# Patient Record
Sex: Male | Born: 1970 | Race: White | Hispanic: No | Marital: Married | Smoking: Former smoker
Health system: Southern US, Community
[De-identification: ages and names within clinical notes are randomized; demographics above are authoritative.]

## PROBLEM LIST (undated history)

## (undated) DIAGNOSIS — K319 Disease of stomach and duodenum, unspecified: Secondary | ICD-10-CM

## (undated) DIAGNOSIS — I712 Thoracic aortic aneurysm, without rupture, unspecified: Secondary | ICD-10-CM

## (undated) DIAGNOSIS — Z720 Tobacco use: Secondary | ICD-10-CM

## (undated) DIAGNOSIS — Q8740 Marfan's syndrome, unspecified: Secondary | ICD-10-CM

## (undated) HISTORY — DX: Thoracic aortic aneurysm, without rupture, unspecified: I71.20

## (undated) HISTORY — DX: Marfan syndrome, unspecified: Q87.40

## (undated) HISTORY — DX: Disease of stomach and duodenum, unspecified: K31.9

## (undated) HISTORY — DX: Tobacco use: Z72.0

## (undated) HISTORY — PX: AORTIC VALVE REPLACEMENT: SHX41

## (undated) HISTORY — DX: Thoracic aortic aneurysm, without rupture: I71.2

## (undated) SURGERY — Surgical Case
Anesthesia: *Unknown

---

## 2002-09-16 HISTORY — PX: VASECTOMY: SHX75

## 2005-10-04 ENCOUNTER — Ambulatory Visit (HOSPITAL_COMMUNITY): Admission: RE | Admit: 2005-10-04 | Discharge: 2005-10-04 | Payer: Self-pay | Admitting: Cardiovascular Disease

## 2005-11-29 ENCOUNTER — Ambulatory Visit: Payer: Self-pay | Admitting: Dentistry

## 2005-11-29 ENCOUNTER — Encounter (HOSPITAL_COMMUNITY): Admission: RE | Admit: 2005-11-29 | Discharge: 2005-11-29 | Payer: Self-pay | Admitting: Dentistry

## 2005-12-05 ENCOUNTER — Ambulatory Visit (HOSPITAL_COMMUNITY): Admission: RE | Admit: 2005-12-05 | Discharge: 2005-12-05 | Payer: Self-pay | Admitting: Dentistry

## 2005-12-17 ENCOUNTER — Ambulatory Visit: Payer: Self-pay | Admitting: Dentistry

## 2005-12-26 ENCOUNTER — Ambulatory Visit (HOSPITAL_COMMUNITY): Admission: RE | Admit: 2005-12-26 | Discharge: 2005-12-26 | Payer: Self-pay | Admitting: Gastroenterology

## 2005-12-26 ENCOUNTER — Encounter (INDEPENDENT_AMBULATORY_CARE_PROVIDER_SITE_OTHER): Payer: Self-pay | Admitting: Specialist

## 2006-01-13 ENCOUNTER — Inpatient Hospital Stay (HOSPITAL_COMMUNITY): Admission: RE | Admit: 2006-01-13 | Discharge: 2006-01-16 | Payer: Self-pay | Admitting: Cardiothoracic Surgery

## 2006-01-13 ENCOUNTER — Encounter (INDEPENDENT_AMBULATORY_CARE_PROVIDER_SITE_OTHER): Payer: Self-pay | Admitting: *Deleted

## 2006-02-06 ENCOUNTER — Encounter (HOSPITAL_COMMUNITY): Admission: RE | Admit: 2006-02-06 | Discharge: 2006-05-07 | Payer: Self-pay | Admitting: Cardiovascular Disease

## 2006-02-06 ENCOUNTER — Encounter: Admission: RE | Admit: 2006-02-06 | Discharge: 2006-02-06 | Payer: Self-pay | Admitting: Cardiothoracic Surgery

## 2006-05-08 ENCOUNTER — Encounter (HOSPITAL_COMMUNITY): Admission: RE | Admit: 2006-05-08 | Discharge: 2006-08-06 | Payer: Self-pay | Admitting: Cardiovascular Disease

## 2007-08-29 IMAGING — CT CT ANGIO CHEST
4 of 6 series · 19 of 46 positions shown · IV contrast (APPLIED)
Comparison: None.
COMPARISON: None.

CLINICAL DATA: Marfan's syndrome diagnosed in 4445. Mid to upper left chest
pain. Left pneumothorax in 4445. History of hypertension.

CT ANGIOGRAPHY OF CHEST
TECHNIQUE: Multidetector CT imaging of the chest was performed during bolus
injection of intravenous contrast.  Multiplanar CT angiographic image
reconstructions were generated to evaluate the vascular anatomy.
Contrast:   100 cc Omnipaque 300
TECHNIQUE: Multidetector CT imaging of the abdomen was performed before and
during bolus injection of intravenous contrast.  Multiplanar CT angiographic
image reconstructions were generated to evaluate the vascular anatomy.
Contrast:  100 cc Omnipaque 300

[Series 5: dissection 2.0 st · axial · 0.73mm/px · z∈[-484,-44]mm · 11 of 265 slices shown]
[im 23/265  lung]
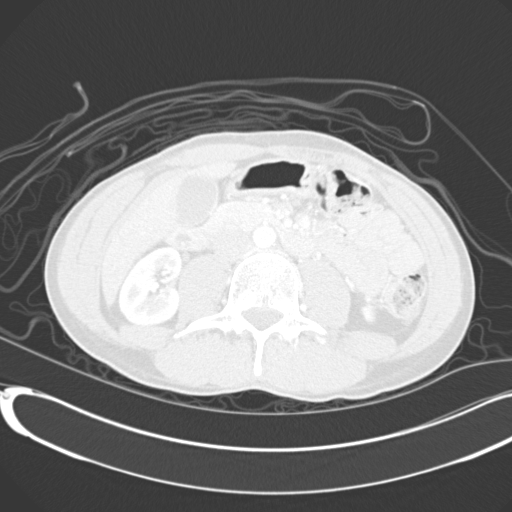
[im 45/265  soft-tissue]
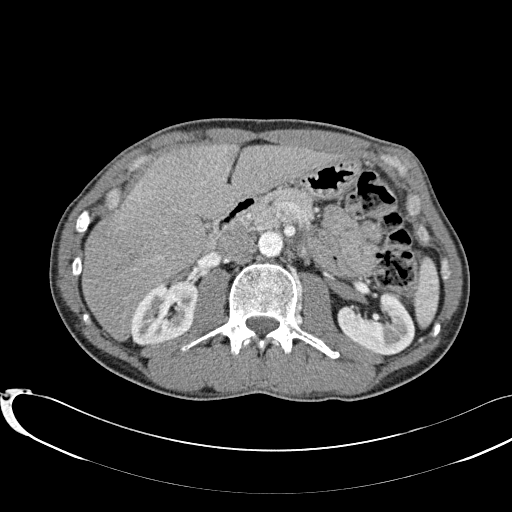
[im 67/265  lung]
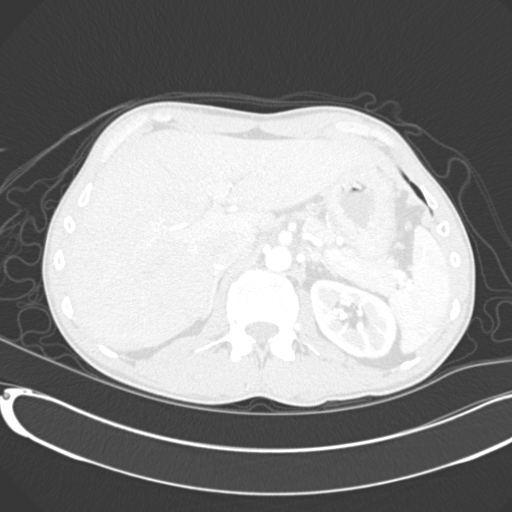
[im 89/265  soft-tissue]
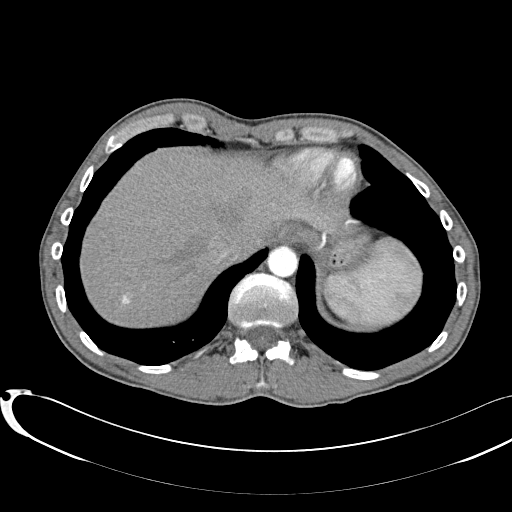
[im 111/265  lung]
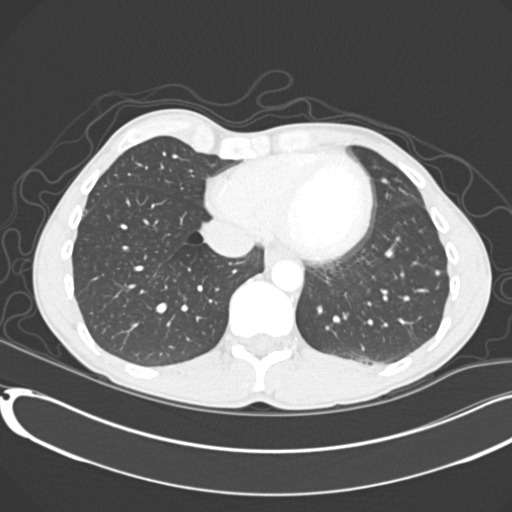
[im 133/265  soft-tissue]
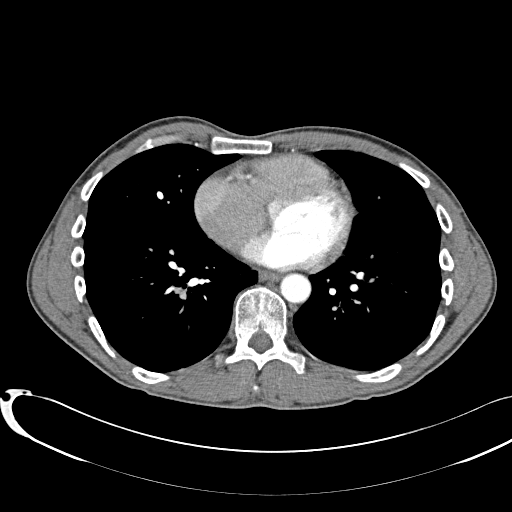
[im 155/265  lung]
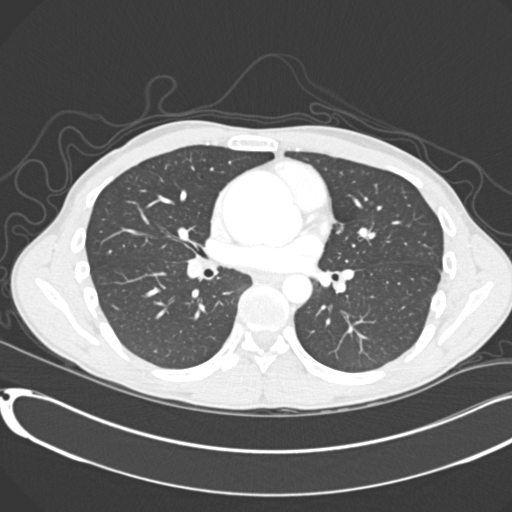
[im 177/265  soft-tissue]
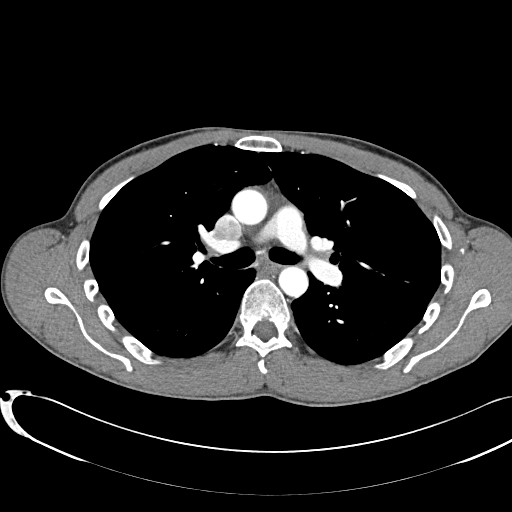
[im 199/265  lung]
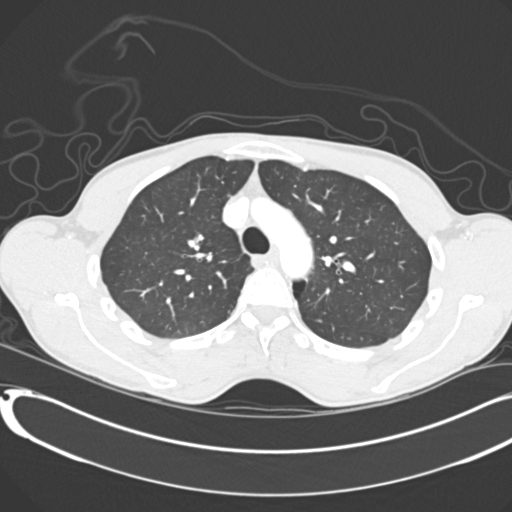
[im 221/265  soft-tissue]
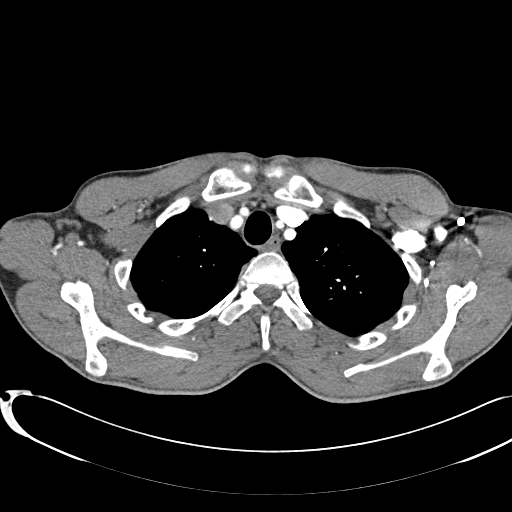
[im 243/265  lung]
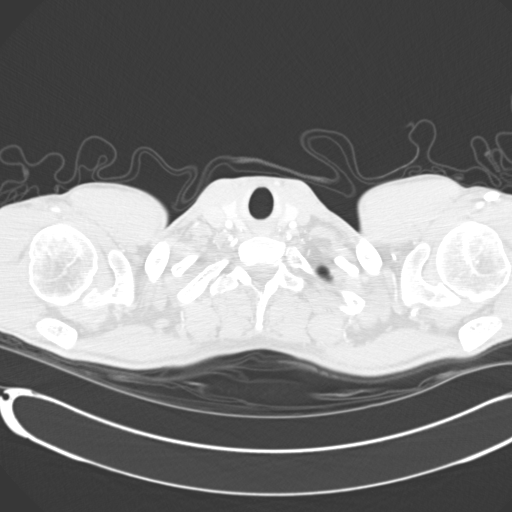

[Series 6: dissection 2.0 lung · axial · 0.73mm/px · z∈[-350,-306]mm · 2 of 197 slices shown]
[im 22/197  soft-tissue]
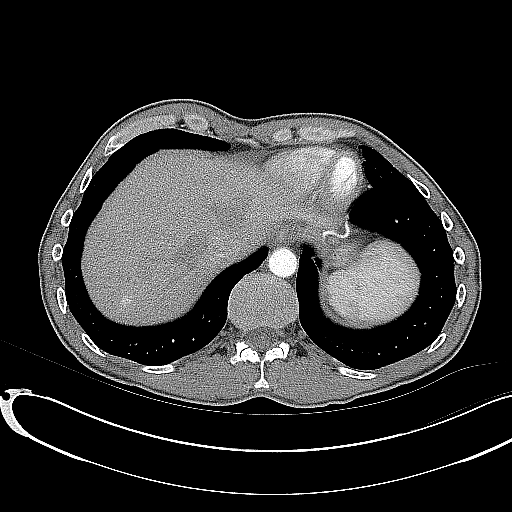
[im 44/197  soft-tissue]
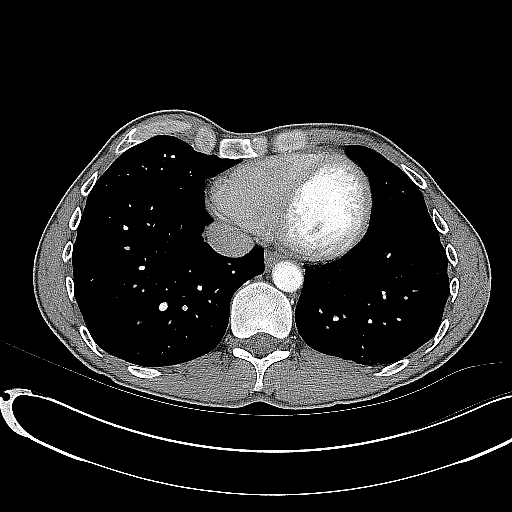

[Series 8: delays 2.0 st · axial · 0.73mm/px · z∈[-479,-383]mm · 3 of 97 slices shown]
[im 25/97  lung]
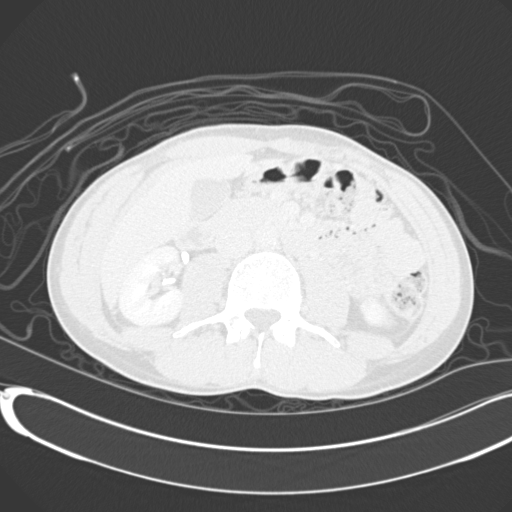
[im 49/97  lung]
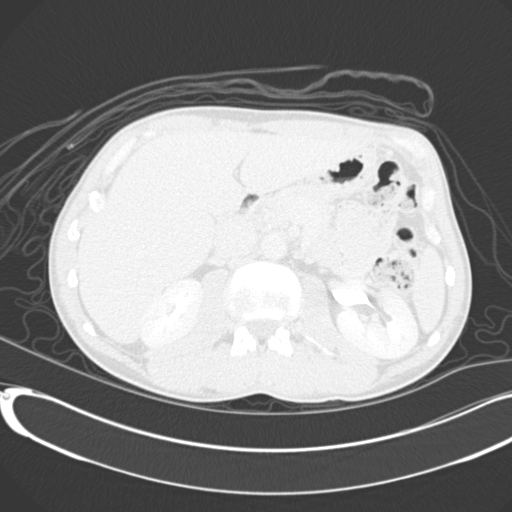
[im 73/97  lung]
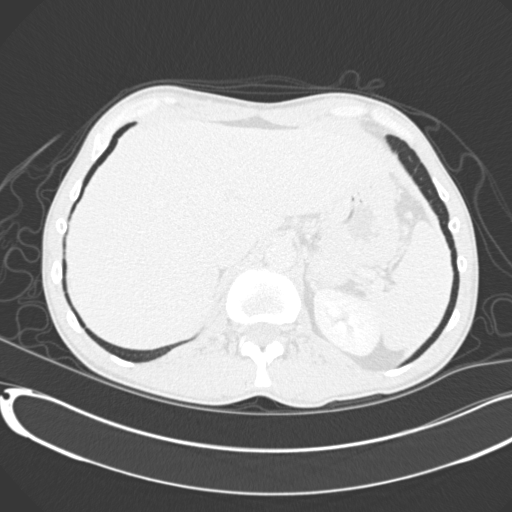

[Series 603: coronal chest · coronal · 0.77mm/px · 3 of 255 slices shown]
[im 85/255  soft-tissue]
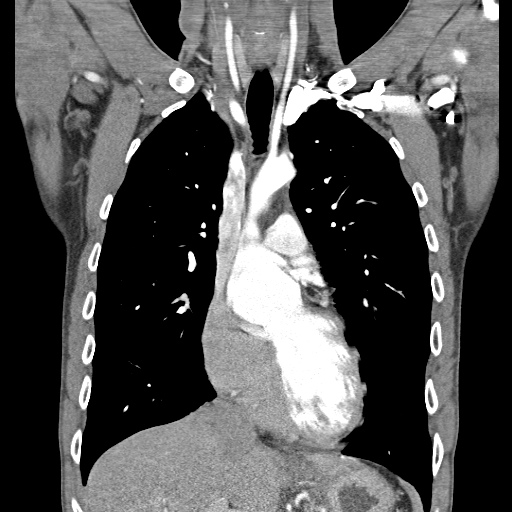
[im 113/255  soft-tissue]
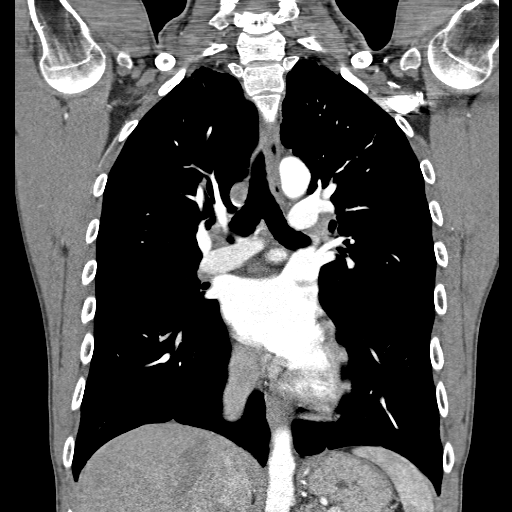
[im 142/255  soft-tissue]
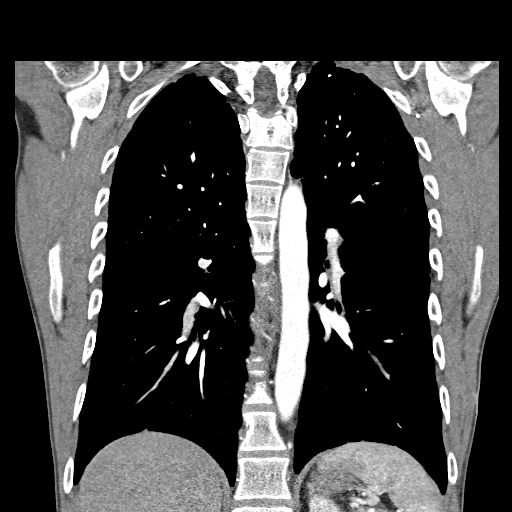

[19 of 46 positions shown; findings below may reference images not displayed]

FINDINGS: The aortic root is dilated, measuring 5.8 x 5.5 cm in maximum
dimensions. The remainder of the thoracic aorta is normal in caliber. No
dissection seen. Bullous changes are noted in both lungs. These are most
pronounced at the left lung apex and medial aspect of the left upper lobe. No
masses or enlarged lymph nodes are seen. Unremarkable bones.

IMPRESSION

1. Dilated aortic root, measuring 5.8 cm in maximum diameter.
2. Bullous changes in both lungs, most pronounced in the left upper lobe.

CT ANGIOGRAPHY OF ABDOMEN
FINDINGS: The abdominal aorta has a normal appearance with the exception of
minimal atheromatous calcification. There is also minimal atheromatous
calcification involving the left common iliac artery. No iliac artery aneurysms
are seen. Unremarkable liver, spleen, pancreas, gallbladder, kidneys and adrenal
glands. Unremarkable bones.

IMPRESSION

Minimal atheromatous changes involving the abdominal aorta and left common iliac
artery. No acute abnormality.

## 2008-07-12 ENCOUNTER — Emergency Department (HOSPITAL_COMMUNITY): Admission: EM | Admit: 2008-07-12 | Discharge: 2008-07-12 | Payer: Self-pay | Admitting: Emergency Medicine

## 2008-11-09 ENCOUNTER — Encounter: Admission: RE | Admit: 2008-11-09 | Discharge: 2008-11-09 | Payer: Self-pay | Admitting: Internal Medicine

## 2010-10-07 ENCOUNTER — Encounter: Payer: Self-pay | Admitting: Internal Medicine

## 2010-10-07 ENCOUNTER — Encounter: Payer: Self-pay | Admitting: Cardiothoracic Surgery

## 2011-02-01 NOTE — Op Note (Signed)
NAMEKEION, Ball NO.:  [id number]   MEDICAL RECORD NO.:  [id number]          PATIENT TYPE:  INP   LOCATION:  2308                         FACILITY:  [facility]   PHYSICIAN:  Guadalupe Maple, M.D.  DATE OF BIRTH:  1971-04-26   DATE OF PROCEDURE:  01/13/2006  DATE OF DISCHARGE:                                 OPERATIVE REPORT   PROCEDURE:  Intraoperative transesophageal echocardiography.   Mr. Christopher Ball is a 40 year old white male with a history of Marfan's  syndrome, who is scheduled for resection of an ascending aortic aneurysm.  The patient had been followed in [place] with a known ascending aortic  aneurysm, and it was determined that the aneurysm required surgery at this  time.  Intraoperative transesophageal echocardiography was requested to  evaluate the aneurysm and to assess the aortic valve and to determine if any  other variant of pathology was present and to assess the left ventricular  function.   Patient was brought to the operating room at [facility], and  general anesthesia was induced without difficulty.  The trachea was  intubated without difficulty.  The transesophageal echocardiography probe  was then inserted into the esophagus easily.   IMPRESSION/PRE-BYPASS FINDINGS:  1.  Ascending aorta:  It was an ascending aortic aneurysm which measured 5.9      to 6.1 cm in diameter and measured approximately 5.3 cm in length.  It      began at the aortic valve and extended to the area where the right      pulmonary artery passed posterior to the aorta.  There was no defined      sinuses of Valsalva or sinotubular ridge.  There was no evidence of      dissection of the aorta.  2.  Aortic valve:  The aortic valve appeared trileaflet.  The annulus      appeared dilated, and there was incomplete coaptation along the left, a      noncoronary cusp, and right, a noncoronary cusp.  3.  Separate jets of aortic insufficiency but the aortic  insufficiency was      graded as 2+ overall.  There was no calcification of the leaflets of the      aortic valve.  The valve appeared to open normally.  The aortic annulus      measured 2.48 cm in diameter.  4.  Mitral valve:  The mitral valve examination reveals some redundancy of      the anterior leaflet without frank prolapse and no mitral insufficiency      noted.  5.  Left ventricle:  The left ventricular function appeared vigorous, and      the left ventricular ejection fraction was estimated at 65-70%.  Left      ventricular wall thickness measured 0.95 cm at the mid papillary level      of the anterior and posterior walls.  Left ventricular end-diastolic      diameter measured 5.1 cm.  There was good contractility in all segments      interrogated.  6.  Right ventricular function:  The right ventricular function appeared      normal.  There was good contractility of the right ventricular free      wall, and right ventricular size appeared normal.  7.  Interatrial septum:  The interatrial septum was intact without evidence      of patent foramen ovale by color flow Doppler interrogation.  8.  Left atrium:  The left atrial size appeared to be within normal limits.      There was no thrombus noted in the left atrium and left atrial      appendage.  9.  Descending aorta:  The descending aorta showed no evidence of dissection      and measured 2.6 cm in diameter.  There was no significant atheromatous      disease noted in the descending aorta or ascending aorta.   POST BYPASS FINDINGS:  1.  Aortic valve:  There was a bioprosthetic aortic valve noted with no      evidence of aortic insufficiency.  2.  Ascending aorta:  The ascending aorta appeared to be replaced by a      prosthetic graft.  3.  Mitral valve:  The mitral valve function, again, showed redundancy of      the anterior leaflet and no mitral insufficiency appreciated.  4.  Left ventricle:  The left ventricular  function, again, appeared      vigorous.  Ejection fraction estimated at 65% with no wall motion      abnormalities.  5.  Right ventricular function:  There appeared to be normal right      ventricular function, as in the pre-bypass study.           ______________________________  Guadalupe Maple, M.D.     DCJ/MEDQ  D:  01/13/2006  T:  01/13/2006  Job:  [id number]

## 2011-02-01 NOTE — Op Note (Signed)
NAMEDARLE, NEWBERG NO.:  [id number]   MEDICAL RECORD NO.:  [id number]          PATIENT TYPE:  AMB   LOCATION:  DAY                          FACILITY:  [facility]   PHYSICIAN:  Charlynne Pander, D.D.S.DATE OF BIRTH:  1971-01-24   DATE OF PROCEDURE:  12/05/2005  DATE OF DISCHARGE:                                 OPERATIVE REPORT   PREOPERATIVE DIAGNOSES:  1.  Dilated aortic root.  2.  Preheart surgery dental protocol.  3.  Chronic apical periodontitis.  4.  Chronic periodontitis.  5.  Accretions.  6.  Rampant dental caries.   POSTOPERATIVE DIAGNOSES:  1.  Dilated aortic root.  2.  Preheart surgery dental protocol.  3.  Chronic apical periodontitis.  4.  Chronic periodontitis.  5.  Accretions.  6.  Rampant dental caries.   OPERATIONS:  1.  Surgical extraction of tooth numbers 1, 14, 15, 17, 18, 31 and 32.  2  Four quadrants of selective alveoloplasty.  1.  Scaling and root planing of the remaining dentition.   SURGEON:  Charlynne Pander, D.D.S.   DENTAL ASSISTANT:  Elliot Dally.   ANESTHESIA:  General anesthesia via oral endotracheal tube.   MEDICATIONS:  1.  Cephazolin 1 gram IV, prior to invasive dental procedures.  2.  Local anesthesia with total utilization of four carpules each,      containing 36 mg of Xylocaine with 0.018 mg of epinephrine, as well as      two carpules each containing 9 mg of bupivacaine with 0.009 mg of      epinephrine.   SPECIMENS:  There were 7 teeth which were discarded.   CULTURES:  None.   DRAINS:  None.   COMPLICATIONS:  None.   ESTIMATED BLOOD LOSS:  Less than 50 mL.   FLUIDS:  1500 mL of lactated Ringers solution.   INDICATIONS:  The patient was recently diagnosed with a dilated aortic root.  The patient with anticipated aortic root replacement and a possible aortic  valve replacement as well with Dr. [name]. A dental consultation was  requested to rule out dental infection which would affect the  patient's  systemic health for anticipated heart surgery. The patient was then examined  and treatment planned for multiple extractions with alveoloplasty and  scaling and root planing of the remaining dentition. This treatment plan was  formulated to decrease his risk of complications associated with dental  infection from affecting the patient's systemic health and anticipated heart  surgery.   OPERATIVE FINDINGS:  The patient was examined in operating room #12. The  teeth were identified for extraction. The patient was noted to be affected  by chronic apical periodontitis, chronic periodontitis, accretions, rampant  dental caries, and multiple retained root segments. The aforementioned  necessitated removal of tooth numbers 1, 14, 15, 17, 18, 31 and 32 with  alveoloplasty along with scaling and root planing of the remaining  dentition.   DESCRIPTION OF PROCEDURE:  The patient was brought to main operating room  #12. The patient was then placed in the supine position on operating room  table. General anesthesia  was attempted with a nasoendotracheal tube, but  then this was achieved with an oral endotracheal tube. The throat pack was  placed at this time. The patient was then prepped and draped in the usual  manner for a dental medicine procedure. The oral cavity was thoroughly  examined with the findings as noted above. The patient was then ready for  the dental medicine procedure as follows:   Local anesthesia was administered sequentially over the procedure with a  total utilization of four carpules each containing 36 mg of Xylocaine with  0.018 mg of epinephrine as well as two carpules each containing 9 mg of  bupivacaine with 0.009 mg of epinephrine.   The maxillary left and mandibular left quadrants were first approached.  Anesthesia was delivered via infiltration. An inferior alveolar nerve block  was also achieved to the lower left with the bupivacaine with epinephrine. A   15-blade incision was made from the distal of the maxillary left tuberosity  and extended to the mesial of #13. A surgical flap was then carefully  reflected.  A surgical handpiece and bur and copious amounts of sterile  saline were utilized to remove buccal and interseptal bone around the roots  of #14 and around tooth #15 as indicated. The surgical handpiece and bur  were then further utilized to section tooth numbers 14 and 15 appropriately.  These remaining roots were then subluxated and then removed with the  rongeurs appropriately. Alveoplasty was then performed utilizing rongeurs  and bone file. The surgical site was then irrigated with copious amounts  sterile saline. The tissues were approximated and trimmed appropriately. The  surgical site was then closed utilizing 3-0 chromic gut suture in a  continuous interrupted suture technique from the distal tuberosity and  extended to the distal of #13 appropriately. One interproximal suture was  placed between tooth numbers 12 and 13 at this time.   The mandibular left quadrant was then approached, a 15-blade incision was  made from the distal of #17 and extended to the mesial of #19. A surgical  flap was then carefully reflected. The buccal and the interseptal bone was  removed around tooth numbers 17 and 18 at this time. The roots of #17 were  then removed with a 23 forceps without complications. A 23 forceps was then  utilized to remove the coronal aspect tooth #18. The roots were then  sectioned with a surgical handpiece and bur and copious amounts of sterile  saline. These roots were then elevated out with a set of Cryers elevators.  Alveoplasty was then performed utilizing rongeurs and bone file. The  surgical site was irrigated with copious amounts of sterile saline. The  tissues were approximated and trimmed appropriately. The surgical site was then closed from distal of #17 and extended to the distal of #19 utilizing 3-  0  chromic gut suture in a continuous interrupted suture technique x1. The  remaining maxillary left and mandibular left teeth were then approached. A  Kavo Sonic Scaler was then utilized to remove the significant accretions  appropriately. A series of hand curettes were then utilized to perform  scaling and root planing of these remaining teeth. The Automatic Data was  then, again, utilized to fine tune the removal of accretions.   The anesthesia team then moved the oral endotracheal tube from the patient's  right side of the mouth to the left side of the mouth appropriately. The  maxillary right and mandibular right quadrants were then anesthetized  appropriately utilizing infiltration and inferior alveolar nerve blocks  appropriately. Tooth #1 was then approached, a 15-blade incision was made  from the distal of the tuberosity and extended to the mesial of #2. A  surgical flap was then carefully reflected, buccal and interseptal bone was  removed around tooth #1 appropriately. This tooth was then subluxated, and  removed with a 150 forceps without complications. Alveoloplasty was then  performed utilizing rongeurs and bone file. The surgical site was then  irrigated with copious amounts sterile saline. The tissues were approximated  and trimmed appropriately. The surgical site was then closed from the distal  of the maxillary right tuberosity, to the distal of #2.   The mandibular right quadrant was then approached, a 15-blade incision was  made from distal of #32 and extended to the mesial of #30. Surgical flap was  then carefully reflected. Appropriate amounts of buccal and interseptal bone  was removed around the remaining roots. These roots were then elevated out  with a set of Cryers elevators appropriately. There were no complications  with this. Alveoplasty was then performed utilizing rongeurs and bone file.  The surgical site was then irrigated with copious amounts of sterile  saline.  These tissues were approximated and trimmed appropriately. The surgical site  was then closed from distal of #32 and extended to the distal of #30  utilizing 3-0 chromic gut suture in a continuous interrupted suture  technique x1.   The remaining mandibular right and maxillary right teeth were then  approached. A Kavo Sonic Scaler was utilized to remove the significant  accretions. Scaling and root planing was then performed appropriately with a  set of curettes. The Automatic Data was then again utilized to remove  further accretions.   At this point time, the entire mouth was irrigated with copious amounts of  sterile saline. The patient was examined for complications, seeing none, the  dental medicine procedure was deemed to be complete. The throat pack was  removed at this time. The patient was then handed over to the anesthesia team for final disposition. After an appropriate amount of time the patient  was extubated and taken to the post anesthesia care unit with stable vital  signs and a good oxygenation level. All counts were correct for the dental  medicine procedure. The patient will be given appropriate pain medication  and will be evaluated in approximately 1 week for suture removal. The  patient will be given clearance for his anticipated heart surgery if no  further complications are noted.      Charlynne Pander, D.D.S.  Electronically Signed     RFK/MEDQ  D:  12/05/2005  T:  12/06/2005  Job:  [id number]   cc:   Sheliah Plane, MD  [address]  [place]  [place] [address]   Nanetta Batty, M.D.  Fax: [phone]

## 2011-02-01 NOTE — Op Note (Signed)
NAMEMATTHEN, GOVONI NO.:  [id number]   MEDICAL RECORD NO.:  [id number]          PATIENT TYPE:  INP   LOCATION:  2308                         FACILITY:  [facility]   PHYSICIAN:  Sheliah Plane, MD    DATE OF BIRTH:  09/16/1971   DATE OF PROCEDURE:  01/13/2006  DATE OF DISCHARGE:                                 OPERATIVE REPORT   PREOPERATIVE DIAGNOSES:  Dilated aortic root with aortic insufficiency and  probable Marfan's.   POSTOPERATIVE DIAGNOSES:  Dilated aortic root with aortic insufficiency and  probableMarfan's.   PROCEDURE:  Aortic root replacement with a Medtronic free style tissue  aortic root, 27 mm, serial #[id number] and replacement of ascending aorta  with 24 mm Hemashield graft with reimplantation of coronary arteries.   SURGEON:  Sheliah Plane, MD.   FIRST ASSISTANT:  Kerin Perna, MD.   BRIEF HISTORY:  The patient is a 40 year old male who presents with a  dilated aortic root and approximately 2+ aortic insufficiency. Its root  diameter was approximately 6 cm. In addition, the patient had a positive  family history of his father dying of an aortic dissection in his 63s.  Replacement of the patient's dilated aortic root was discussed in detail  with him. The patient had a desire not to have to take Coumadin as it would  interfere with his work. The risk and options were discussed with the  patient and he was agreeable with proceeding.   DESCRIPTION OF PROCEDURE:  With Swan-Ganz and arterial line monitors in  line, the patient underwent general endotracheal anesthesia without  incident. The skin of the chest and legs was prepped with Betadine and  draped in the usual sterile manner. A median sternotomy was performed,  pericardium was opened. The patient had a very largely dilated aortic root  probably larger than estimated by echocardiogram. Transesophageal echo probe  was placed intraoperatively and confirmed 2+ aortic insufficiency at  tricuspid aortic valve with very thinned out leaflets and then very quickly  tapering of the aorta at approximately the level of the pulmonary artery to  small aorta. This allowed plenty of room for aortic cannulation in the  chest. The patient was systemically heparinized, the ascending aorta was  cannulated, the right atrium was cannulated, retrograde cardioplegia  catheter was placed. The patient was placed on cardiopulmonary bypass at 2.4  liters per minute per m2. The patient's body temperature cooled to 30  degrees. A right superior pulmonary vein vent was placed. The patient's body  temperature cooled to 30 degrees, aortic cross clamp was applied. 700 mL of  retrograde cold blooded cardioplegia was administered in addition to topical  cold. With the heart rested, the aorta was opened through the dilated  portion, the aortic root was very thinned abnormal appearing tissue. The  aortic root was excised taking care to mobilize buttons of aorta involving  the right coronary artery and the left coronary artery. The buttons were  mobilized, the valve leaflets were excised. The annulus itself was then  sized for a 27 mm Medtronic full aortic root replacement. Over a small Felt  strip 4-0 Prolene sutures were placed through the aortic annulus in a  circumferential manner. This first layer was used as the proximal suture  line, the root replacement was rotated so the prosthetic device right  coronary site was rotated to accommodate the left coronary artery as this  anatomically met the relationships of the patient's own coronary arteries  better. The proximal suture line was completed and the prosthesis seated  well. Glue was placed along the suture line. The site of the prosthesis  right coronary ostium was opened and using a running 5-0 Prolene suture, the  left coronary button was attached to the root. In a similar fashion, the  right coronary button was attached to the aortic root with a  running 5-0  Prolene suture. To prevent any tension on suture lines, a portion of 24 mm  Hemashield graft was sewn with a running 3-0 Prolene to the distal end of  the free style prosthesis and then this was trimmed appropriately and sewed  to the aorta. Prior to the final suture line being completed, the heart was  allowed to pass, __________ and deair. Throughout the procedure intermittent  retrograde cardioplegia was administered as was antegrade cardioplegia  directly into the coronaries. The aortic crossclamp was removed with a total  crossclamp time of 56 minutes. A 16 gauge needle was introduced in the left  ventricular apex to further deair the heart. The suture lines were all  hemostatic. The patient spontaneously converted to a sinus rhythm. The  superior pulmonary vein vent was removed. The patient was then ventilated  and weaned from cardiopulmonary bypass without difficulty. He remained  hemodynamically stable. He was decannulated in the usual fashion. Protamine  sulfate was administered. With the operative field hemostatic, two atrial  and two ventricular pacing wires were applied. The patient's pericardium was  loosely reapproximated. A single Blake drain was left in the mediastinum.  The sternum was closed with a #16 steel wire, fascia closed with interrupted  #0 Vicryl, running 3-0 Vicryl in the subcutaneous tissue and 4-0  subcuticular stitch in the skin. Dry dressings were applied. Sponge and  needle count was reported as correct at the completion of the procedure. The  patient tolerated the procedure without obvious complication and was  transferred to the Surgical Intensive Care Unit for further postoperative  care.      Sheliah Plane, MD  Electronically Signed     EG/MEDQ  D:  01/15/2006  T:  01/15/2006  Job:  [id number]   cc:   Nanetta Batty, M.D.  Fax: [phone]

## 2011-02-01 NOTE — Discharge Summary (Signed)
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   DISCHARGE/SECONDARY DIAGNOSES:  1.  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  2.  Postoperative hyperglycemia, improved.  3.  Postoperative thrombocytopenia, improving.  4.  History of Helicobacter pylori associated with stomach pains and gas      without history of gastrointestinal bleed.  5.  History of left video-assisted thoracoscopic surgery for spontaneous      pneumothorax in 1997.  6.  Current tobacco use with plans to quit, status post smoking cessation      consult this hospitalization.  7.  Intolerance to Augmentin which causes vomiting.   PROCEDURES:  1.  January 13, 2006, aortic root replacement with a Medtronic free-style      aortic valve, 27 mm, serial number [id number] and replacement of      ascending aorta with 24 mm Hemashield graft with reimplantation of      coronary artery by Christopher Ball.  2.  January 13, 2006, intraoperative transesophageal echocardiogram by Dr.      Guadalupe Ball.  See dictated report for detail.   BRIEF HISTORY:  Christopher Ball is a 40 year old Caucasian male who has been  followed for the past four or five years in [place] with serial  echocardiograms for a dilated aorta.  The letters from his doctor in [place] indicate that his aortic root had been in the range of 4.8 cm.  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  He recently moved to [place], [place]  and saw Christopher Ball and was found by echocardiogram to have a  dilation of his aortic root with mild aortic insufficiency with aortic root  dilated by CT scan measuring 5.8 x 5.5 cm.  The patient had had no history  of myocardial infarction and had no previous catheterization.  He has smoked  a pack a day for the last 10 years.  His father had a history of Marfan's  and died during surgery at the age of 1 from a ruptured aortic aneurysm.  He was referred to Christopher Ball for consideration of replacement of  his aortic root and ascending aorta.  He was seen in the CVTS office on  January 07, 2006 in consultation.  By the time, he had undergone cardiac  catheterization which showed normal clear coronaries.  He had also been seen  in dental consultation and was in the process of having dental work and  extractions carried out.  Christopher Ball did feel he would benefit from surgery and discussed treatment  options.  The patient did not wish to take Coumadin even knowing that if a  replacement was performed the mechanical valve would probably last longer.  Therefore, tissue consult was planned.   HOSPITAL COURSE:  Christopher Ball was electively admitted to [facility]  on January 13, 2006 and underwent a right root and ascending aorta replacement  as previously discussed.  His postoperative course was rather uneventful,  although he did require sliding scale insulin initially for postoperative  hyperglycemia with sugars around 200 that had improved by discharge and were  around 110.  He did have a preoperative hemoglobin A1c which was normal at  5.4.  Postoperatively, he also was monitored for thrombocytopenia without  known bleeding complications.  Platelets were as low as 69,000 but were  increasing to 80,000 to 82,000 by discharge.   On postoperative day one, he remained stable but did require short-term  dopamine.   By this time, he had been extubated and neurologically intact.  Chest mediastinal tube output was small and tubes were discontinued later  that day.  He was weaned on the dopamine by postoperative day two and felt  appropriate to transfer from surgical intensive care unit to telemetry unit  2000.  He continued to progress and in fact was felt appropriate for  discharge on postoperative day three.  He remained hemodynamically stable  with last vital signs showing blood pressure 115/64, heart rate around 80 to  100 in sinus rhythm.  He was afebrile and saturating 91-96% on room air.  He  was ambulating well and tolerating regular diet.  His incisions appeared to  be healing well without signs of infection and his exam showed a crisp valve  click.  His pain was controlled on oral medication.   Postoperative chest x-rays were also stable showing bibasilar atelectasis  without pneumothorax following chest tube removal and labs were stable  showing white blood count of 10.0, hemoglobin 10.2, hematocrit 29.8 and  platelet count increasing at 82,000.  Sodium 142, potassium 4.1, chloride  110, CO2 29, BUN 7, creatinine 0.9 and blood glucose 110.  AST 18 and ALT  20.  Alkaline phosphate 51, total bilirubin 0.7, albumin 4.3.   DISPOSITION:  Christopher Ball was discharged home on Jan 16, 2006 postoperative  day three in improved and stable condition.  External pacing wires and chest  sutures were removed prior to discharge.   DISCHARGE MEDICATIONS:  1.  Atenolol 50 mg p.o. daily.  2.  Nexium 40 mg p.o. daily.  3.  Coated aspirin 325 mg p.o. daily.  4.  Oxy IR 5 mg 1-2 tablets p.o. q.4h. p.r.n.   DISCHARGE INSTRUCTIONS:  He was instructed to avoid driving, heavy lifting  more than 10 pounds.  He was encouraged to continue daily walking and  breathing exercises and to follow a low-fat, low-salt diet.  He may shower  and clean his incisions gently with soap and water.  He is to notify the CVTS office if  he develops fever greater than  101 or redness or drainage from the incision site.  He is to follow up with  Christopher Ball in the CVTS office on Feb 06, 2006 at 11:10 a.m. with a  chest x-ray at [facility] one hour before this appointment.  He  should also call and schedule a two week follow up with his cardiology, Dr.  Nanetta Ball.  Jerold Coombe, P.A.      Sheliah Plane, MD  Electronically Signed    AWZ/MEDQ  D:  01/16/2006  T:  01/17/2006  Job:  [id number]   cc:   Patient's chart   Sheliah Plane, MD  [address]  [place]  [place] [address]   Nanetta Ball, M.D.  Fax: [phone]   Olene Craven, M.D.  Fax: [phone]

## 2012-05-15 ENCOUNTER — Other Ambulatory Visit: Payer: Self-pay | Admitting: Nurse Practitioner

## 2012-05-15 ENCOUNTER — Ambulatory Visit
Admission: RE | Admit: 2012-05-15 | Discharge: 2012-05-15 | Disposition: A | Discharge status: Home / Self Care | Payer: Federal, State, Local not specified - PPO | Source: Ambulatory Visit | Attending: Nurse Practitioner | Admitting: Nurse Practitioner

## 2012-05-15 DIAGNOSIS — R079 Chest pain, unspecified: Secondary | ICD-10-CM

## 2012-06-24 ENCOUNTER — Encounter: Payer: Self-pay | Admitting: Internal Medicine

## 2012-06-24 ENCOUNTER — Ambulatory Visit (INDEPENDENT_AMBULATORY_CARE_PROVIDER_SITE_OTHER): Payer: Federal, State, Local not specified - PPO | Admitting: Internal Medicine

## 2012-06-24 VITALS — BP 112/70 | HR 72 | Temp 98.7°F | Resp 16 | Ht 79.0 in | Wt 150.2 lb

## 2012-06-24 DIAGNOSIS — J309 Allergic rhinitis, unspecified: Secondary | ICD-10-CM | POA: Insufficient documentation

## 2012-06-24 DIAGNOSIS — Z23 Encounter for immunization: Secondary | ICD-10-CM

## 2012-06-24 DIAGNOSIS — J329 Chronic sinusitis, unspecified: Secondary | ICD-10-CM

## 2012-06-24 MED ORDER — METHYLPREDNISOLONE 4 MG PO KIT: PACK | ORAL | Status: DC

## 2012-06-24 NOTE — Progress Notes (Signed)
Subjective:    Patient ID: Christopher Ball, male    DOB: March 11, 1971, 41 y.o.   MRN: [id number]  Sinusitis This is a recurrent problem. The current episode started more than 1 month ago. The problem is unchanged. There has been no fever. The fever has been present for less than 1 day. His pain is at a severity of 0/10. He is experiencing no pain. Associated symptoms include congestion and sinus pressure. Pertinent negatives include no chills, coughing, diaphoresis, ear pain, headaches, hoarse voice, neck pain, shortness of breath, sneezing, sore throat or swollen glands. Past treatments include antibiotics ( augmentin for several weeks, ?steroid ns). The treatment provided mild relief.      Review of Systems  Constitutional: Negative for fever, chills, diaphoresis, activity change, appetite change, fatigue and unexpected weight change.  HENT: Positive for congestion, rhinorrhea, postnasal drip and sinus pressure. Negative for hearing loss, ear pain, nosebleeds, sore throat, hoarse voice, facial swelling, sneezing, drooling, mouth sores, trouble swallowing, neck pain, neck stiffness, dental problem, voice change, tinnitus and ear discharge.   Eyes: Negative.   Respiratory: Negative for apnea, cough, choking, chest tightness, shortness of breath, wheezing and stridor.   Cardiovascular: Negative for chest pain, palpitations and leg swelling.  Gastrointestinal: Negative.   Genitourinary: Negative.   Musculoskeletal: Negative.   Skin: Negative for color change, pallor, rash and wound.  Neurological: Negative.  Negative for headaches.  Hematological: Negative for adenopathy. Does not bruise/bleed easily.  Psychiatric/Behavioral: Negative.        Objective:   Physical Exam  Vitals reviewed. Constitutional: He is oriented to person, place, and time. He appears well-developed and well-nourished.  Non-toxic appearance. He does not have a sickly appearance. He does not appear ill. No distress.    HENT:  Head: Normocephalic and atraumatic. No trismus in the jaw.  Right Ear: Hearing, tympanic membrane, external ear and ear canal normal.  Left Ear: Hearing, tympanic membrane, external ear and ear canal normal.  Nose: Mucosal edema and rhinorrhea present. No nose lacerations, sinus tenderness, nasal deformity, septal deviation or nasal septal hematoma. No epistaxis.  No foreign bodies. Right sinus exhibits no maxillary sinus tenderness and no frontal sinus tenderness. Left sinus exhibits no maxillary sinus tenderness and no frontal sinus tenderness.  Mouth/Throat: Oropharynx is clear and moist and mucous membranes are normal. Mucous membranes are not pale, not dry and not cyanotic. No oral lesions. No uvula swelling. No oropharyngeal exudate, posterior oropharyngeal edema, posterior oropharyngeal erythema or tonsillar abscesses.  Eyes: Conjunctivae normal are normal. Right eye exhibits no discharge. Left eye exhibits no discharge. No scleral icterus.  Neck: Normal range of motion. Neck supple. No JVD present. No tracheal deviation present. No thyromegaly present.  Cardiovascular: Normal rate, regular rhythm, normal heart sounds and intact distal pulses.  Exam reveals no gallop and no friction rub.   No murmur heard. Pulmonary/Chest: Effort normal and breath sounds normal. No stridor. No respiratory distress. He has no wheezes. He has no rales. He exhibits no tenderness.  Abdominal: Soft. Bowel sounds are normal. He exhibits no distension and no mass. There is no tenderness. There is no rebound and no guarding.  Musculoskeletal: Normal range of motion. He exhibits no edema and no tenderness.  Lymphadenopathy:    He has no cervical adenopathy.  Neurological: He is oriented to person, place, and time.  Skin: Skin is warm and dry. No rash noted. He is not diaphoretic. No erythema. No pallor.  Psychiatric: He has a normal mood and affect.  His behavior is normal. Judgment and thought content normal.       No results found for this basename: WBC, HGB, HCT, PLT, GLUCOSE, CHOL, TRIG, HDL, LDLDIRECT, LDLCALC, ALT, AST, NA, K, CL, CREATININE, BUN, CO2, TSH, PSA, INR, GLUF, HGBA1C, MICROALBUR      Assessment & Plan:

## 2012-06-24 NOTE — Patient Instructions (Signed)

## 2012-06-24 NOTE — Assessment & Plan Note (Addendum)
He has tried antibiotics with some improvement but not significant relief, I have asked him to quit smoking, will try steroids to treat the allergic/inflammatory component, I have asked him to get a CT done of his sinuses to see if there is a polyp, mass, obstruction of the sinuses or OM complexes  Late note - his CT scan shows an A/F level in his left frontal sinus and diffuse inflammation so I have asked him to take levaquin for one month and to start flonase ns

## 2012-06-24 NOTE — Assessment & Plan Note (Signed)
He will try to quit smoking and will take a medrol dose pak to help relieve the symptoms

## 2012-06-26 ENCOUNTER — Ambulatory Visit (INDEPENDENT_AMBULATORY_CARE_PROVIDER_SITE_OTHER)
Admission: RE | Admit: 2012-06-26 | Discharge: 2012-06-26 | Disposition: A | Discharge status: Home / Self Care | Payer: Federal, State, Local not specified - PPO | Source: Ambulatory Visit | Attending: Internal Medicine | Admitting: Internal Medicine

## 2012-06-26 DIAGNOSIS — J329 Chronic sinusitis, unspecified: Secondary | ICD-10-CM

## 2012-06-28 ENCOUNTER — Other Ambulatory Visit: Payer: Self-pay | Admitting: Internal Medicine

## 2012-06-28 ENCOUNTER — Encounter: Payer: Self-pay | Admitting: Internal Medicine

## 2012-06-28 MED ORDER — LEVOFLOXACIN 750 MG PO TABS: 750.0000 mg | ORAL_TABLET | Freq: Every day | ORAL | Status: DC

## 2012-06-28 MED ORDER — FLUTICASONE PROPIONATE 50 MCG/ACT NA SUSP: 2.0000 | Freq: Every day | NASAL | Status: DC

## 2012-06-28 NOTE — Addendum Note (Signed)
Addended by: Etta Grandchild on: 06/28/2012 12:30 PM   Modules accepted: Orders

## 2013-07-28 ENCOUNTER — Other Ambulatory Visit: Payer: Self-pay | Admitting: Internal Medicine

## 2013-10-14 ENCOUNTER — Telehealth (HOSPITAL_COMMUNITY): Payer: Self-pay | Admitting: *Deleted

## 2013-10-14 NOTE — Telephone Encounter (Signed)
Error

## 2014-01-01 ENCOUNTER — Emergency Department (HOSPITAL_COMMUNITY): Payer: Federal, State, Local not specified - PPO

## 2014-01-01 ENCOUNTER — Emergency Department (HOSPITAL_COMMUNITY)
Admission: EM | Admit: 2014-01-01 | Discharge: 2014-01-01 | Disposition: A | Discharge status: Home / Self Care | Payer: Federal, State, Local not specified - PPO | Attending: Emergency Medicine | Admitting: Emergency Medicine

## 2014-01-01 ENCOUNTER — Encounter (HOSPITAL_COMMUNITY): Payer: Self-pay | Admitting: Emergency Medicine

## 2014-01-01 DIAGNOSIS — R200 Anesthesia of skin: Secondary | ICD-10-CM

## 2014-01-01 DIAGNOSIS — F17200 Nicotine dependence, unspecified, uncomplicated: Secondary | ICD-10-CM | POA: Insufficient documentation

## 2014-01-01 DIAGNOSIS — Q8740 Marfan's syndrome, unspecified: Secondary | ICD-10-CM | POA: Insufficient documentation

## 2014-01-01 DIAGNOSIS — IMO0002 Reserved for concepts with insufficient information to code with codable children: Secondary | ICD-10-CM | POA: Insufficient documentation

## 2014-01-01 DIAGNOSIS — R011 Cardiac murmur, unspecified: Secondary | ICD-10-CM | POA: Insufficient documentation

## 2014-01-01 DIAGNOSIS — R5381 Other malaise: Secondary | ICD-10-CM | POA: Insufficient documentation

## 2014-01-01 DIAGNOSIS — R519 Headache, unspecified: Secondary | ICD-10-CM

## 2014-01-01 DIAGNOSIS — Z8679 Personal history of other diseases of the circulatory system: Secondary | ICD-10-CM | POA: Insufficient documentation

## 2014-01-01 DIAGNOSIS — R209 Unspecified disturbances of skin sensation: Secondary | ICD-10-CM | POA: Insufficient documentation

## 2014-01-01 DIAGNOSIS — R531 Weakness: Secondary | ICD-10-CM

## 2014-01-01 DIAGNOSIS — R51 Headache: Secondary | ICD-10-CM | POA: Insufficient documentation

## 2014-01-01 DIAGNOSIS — R5383 Other fatigue: Secondary | ICD-10-CM

## 2014-01-01 LAB — COMPREHENSIVE METABOLIC PANEL
ALT: 17 U/L (ref 0–53)
AST: 21 U/L (ref 0–37)
Albumin: 3.8 g/dL (ref 3.5–5.2)
Alkaline Phosphatase: 68 U/L (ref 39–117)
BUN: 16 mg/dL (ref 6–23)
CALCIUM: 9.2 mg/dL (ref 8.4–10.5)
CO2: 23 mEq/L (ref 19–32)
Chloride: 103 mEq/L (ref 96–112)
Creatinine, Ser: 1.24 mg/dL (ref 0.50–1.35)
GFR calc Af Amer: 82 mL/min — ABNORMAL LOW (ref >90)
GFR calc non Af Amer: 71 mL/min — ABNORMAL LOW (ref >90)
Glucose, Bld: 96 mg/dL (ref 70–99)
Potassium: 4.4 mEq/L (ref 3.7–5.3)
SODIUM: 139 meq/L (ref 137–147)
Total Bilirubin: 0.4 mg/dL (ref 0.3–1.2)
Total Protein: 6.8 g/dL (ref 6.0–8.3)

## 2014-01-01 LAB — CBG MONITORING, ED: GLUCOSE-CAPILLARY: 116 mg/dL — AB (ref 70–99)

## 2014-01-01 LAB — CBC WITH DIFFERENTIAL/PLATELET
BASOS ABS: 0.1 10*3/uL (ref 0.0–0.1)
Basophils Relative: 1 % (ref 0–1)
EOS PCT: 4 % (ref 0–5)
Eosinophils Absolute: 0.4 10*3/uL (ref 0.0–0.7)
HCT: 41.4 % (ref 39.0–52.0)
Hemoglobin: 14.3 g/dL (ref 13.0–17.0)
LYMPHS PCT: 24 % (ref 12–46)
Lymphs Abs: 2.3 10*3/uL (ref 0.7–4.0)
MCH: 32.6 pg (ref 26.0–34.0)
MCHC: 34.5 g/dL (ref 30.0–36.0)
MCV: 94.5 fL (ref 78.0–100.0)
Monocytes Absolute: 0.6 10*3/uL (ref 0.1–1.0)
Monocytes Relative: 6 % (ref 3–12)
Neutro Abs: 6.3 10*3/uL (ref 1.7–7.7)
Neutrophils Relative %: 65 % (ref 43–77)
PLATELETS: 202 10*3/uL (ref 150–400)
RBC: 4.38 MIL/uL (ref 4.22–5.81)
RDW: 12.9 % (ref 11.5–15.5)
WBC: 9.7 10*3/uL (ref 4.0–10.5)

## 2014-01-01 LAB — I-STAT TROPONIN, ED: Troponin i, poc: 0 ng/mL (ref 0.00–0.08)

## 2014-01-01 LAB — URINALYSIS, DIPSTICK ONLY
Bilirubin Urine: NEGATIVE
GLUCOSE, UA: NEGATIVE mg/dL
Hgb urine dipstick: NEGATIVE
Ketones, ur: NEGATIVE mg/dL
Leukocytes, UA: NEGATIVE
Nitrite: NEGATIVE
PH: 7.5 (ref 5.0–8.0)
Protein, ur: NEGATIVE mg/dL
SPECIFIC GRAVITY, URINE: 1.016 (ref 1.005–1.030)
Urobilinogen, UA: 1 mg/dL (ref 0.0–1.0)

## 2014-01-01 MED ORDER — IOHEXOL 350 MG/ML SOLN
130.0000 mL | Freq: Once | INTRAVENOUS | Status: AC | PRN
  Administered 2014-01-01: 130 mL via INTRAVENOUS

## 2014-01-01 NOTE — ED Notes (Signed)
Pt states yesterday he felt like he couldn't walk straight and he was having numbness in his arms. He felt that his symptoms got better then today his wife noticed his speech "seemed slower than normal." hes also noticed numbness in both of his arms this afternoon, which has resolved now. He c/o headache now

## 2014-01-01 NOTE — ED Provider Notes (Signed)
CSN: [id number]     Arrival date & time 01/01/14  1633 History   First MD Initiated Contact with Patient 01/01/14 1940     Chief Complaint  Patient presents with  . Numbness     (Consider location/radiation/quality/duration/timing/severity/associated sxs/prior Treatment) HPI  This is a 43 y.o. male with a past medical history of Marfan's disease status post aortic aneurysm, aortic root replacement, aortic valve replacement, presenting today with ataxia as well as weakness, numbness. Patient was at home yesterday when he appreciated a sensation described as "I just felt like I was leaning to the left when I was walking." This resolved spontaneously after 30 minutes. Patient went to bed without symptoms. Prior to arrival patient experienced bilateral upper extremity weakness and numbness.  This resolves spontaneously after one hour. No meds taken. No radiation. Patient denies chest pain shortness of breath, vision change, change in speech, abdominal pain, nausea, vomiting, fever. Patient denies any change in his bowel or bladder function and has no complaints pertaining to his lower extremities.  Past Medical History  Diagnosis Date  . Heart disease   . Heart murmur   . Marfan syndrome    Past Surgical History  Procedure Laterality Date  . Vasectomy  09/16/2002  . Aortic valve surgery    . Aortic root replacement     Family History  Problem Relation Age of Onset  . Asthma Mother   . Diabetes Mother   . Early death Father   . Heart disease Father   . Asthma Brother   . Cancer Neg Hx   . Stroke Neg Hx   . Kidney disease Neg Hx   . Hypertension Neg Hx   . Hyperlipidemia Neg Hx    History  Substance Use Topics  . Smoking status: Current Every Day Smoker -- 1.00 packs/day for 20 years  . Smokeless tobacco: Never Used  . Alcohol Use: 0.6 oz/week    1 Shots of liquor per week    Review of Systems  Constitutional: Negative for fever and chills.  HENT: Negative for facial  swelling.   Eyes: Negative for pain and visual disturbance.  Respiratory: Negative for chest tightness and shortness of breath.   Cardiovascular: Negative for chest pain.  Gastrointestinal: Negative for nausea and vomiting.  Genitourinary: Negative for dysuria.  Musculoskeletal: Negative for arthralgias and myalgias.  Neurological: Positive for weakness, numbness and headaches.  Psychiatric/Behavioral: Negative for behavioral problems.      Allergies  Augmentin  Home Medications   Prior to Admission medications   Medication Sig Start Date End Date Taking? Authorizing Provider  fluticasone (FLONASE) 50 MCG/ACT nasal spray Place 2 sprays into both nostrils at bedtime.   Yes Historical Provider, MD  Multiple Vitamins-Minerals (MULTIVITAMIN WITH MINERALS) tablet Take 1 tablet by mouth daily.   Yes Historical Provider, MD   BP 122/70  Pulse 65  Temp(Src) 98.6 F (37 C) (Oral)  Resp 17  Ht 6\' 6"  (1.981 m)  Wt 154 lb 4.8 oz (69.99 kg)  BMI 17.83 kg/m2  SpO2 98% Physical Exam  Constitutional: He is oriented to person, place, and time. He appears well-developed and well-nourished. No distress.  HENT:  Head: Normocephalic and atraumatic.  Mouth/Throat: No oropharyngeal exudate.  Eyes: Conjunctivae are normal. Pupils are equal, round, and reactive to light. No scleral icterus.  Neck: Normal range of motion. No tracheal deviation present. No thyromegaly present.  Cardiovascular: Normal rate, regular rhythm and normal heart sounds.  Exam reveals no gallop and no friction  rub.   No murmur heard. Pulses:      Carotid pulses are 2+ on the right side, and 2+ on the left side.      Radial pulses are 2+ on the right side, and 2+ on the left side.       Dorsalis pedis pulses are 2+ on the right side, and 2+ on the left side.  Pulmonary/Chest: Effort normal and breath sounds normal. No stridor. No respiratory distress. He has no wheezes. He has no rales. He exhibits no tenderness.   Abdominal: Soft. He exhibits no distension and no mass. There is no tenderness. There is no rebound and no guarding.  Musculoskeletal: Normal range of motion. He exhibits no edema.  Neurological: He is alert and oriented to person, place, and time. He has normal strength. No cranial nerve deficit or sensory deficit. He displays a negative Romberg sign. Coordination and gait normal. GCS eye subscore is 4. GCS verbal subscore is 5. GCS motor subscore is 6.  Reflex Scores:      Patellar reflexes are 2+ on the right side and 2+ on the left side. Skin: Skin is warm and dry. He is not diaphoretic.    ED Course  Procedures (including critical care time) Labs Review Labs Reviewed  COMPREHENSIVE METABOLIC PANEL - Abnormal; Notable for the following:    GFR calc non Af Amer 71 (*)    GFR calc Af Amer 82 (*)    All other components within normal limits  CBG MONITORING, ED - Abnormal; Notable for the following:    Glucose-Capillary 116 (*)    All other components within normal limits  CBC WITH DIFFERENTIAL  URINALYSIS, DIPSTICK ONLY  I-STAT TROPOININ, ED    Imaging Review Ct Angio Head W/cm &/or Wo Cm  01/01/2014   CLINICAL DATA:  43 year old male with upper extremity weakness, numbness, abnormal gait. Marked and syndrome. Initial encounter.  EXAM: CT ANGIOGRAPHY HEAD AND NECK  TECHNIQUE: Multidetector CT imaging of the head and neck was performed using the standard protocol during bolus administration of intravenous contrast. Multiplanar CT image reconstructions and MIPs were obtained to evaluate the vascular anatomy. Carotid stenosis measurements (when applicable) are obtained utilizing NASCET criteria, using the distal internal carotid diameter as the denominator.  CONTRAST:  155mL OMNIPAQUE IOHEXOL 350 MG/ML SOLN  COMPARISON:  Paranasal sinus CT 06/26/2012. Chest CTA findings from the same day reported separately.  FINDINGS: CTA HEAD FINDINGS  Calvarium intact. Negative scalp soft tissues.  Cerebral volume is normal. No midline shift, ventriculomegaly, mass effect, evidence of mass lesion, intracranial hemorrhage or evidence of cortically based acute infarction. Gray-white matter differentiation is within normal limits throughout the brain. No abnormal enhancement identified.  VASCULAR FINDINGS:  Codominant distal vertebral arteries are normal. Normal PICA origins. Patent vertebrobasilar junction. No basilar stenosis. SCA and PCA origins are within normal limits. Normal right posterior communicating artery, the left is diminutive or absent. Bilateral PCA branches are within normal limits.  Bilateral ICA siphons are patent and within normal limits. Ophthalmic and right posterior communicating artery origins are within normal limits. Normal carotid termini. Normal MCA and ACA origins.  Mildly tortuous but otherwise negative proximal ACA vessels. Normal anterior communicating artery. Bilateral ACA branches are within normal limits. Right MCA branches are within normal limits. Left MCA branches are within normal limits.  Review of the MIP images confirms the above findings.  CTA NECK FINDINGS  Chest CTA findings from today are reported separately.  Negative thyroid, larynx, parapharyngeal spaces, retropharyngeal  space, sublingual space, submandibular glands, and parotid glands. Pharynx remarkable for hypertrophy of the lingual tonsil. Small retention cyst of the left tonsillar pillar. Visualized orbit soft tissues are within normal limits. Fluid in the maxillary sinuses with mild to moderate paranasal sinus mucosal thickening elsewhere. Mastoids and tympanic cavities are clear. No acute osseous abnormality identified. Degenerative changes in lower cervical spine.  VASCULAR FINDINGS:  3 vessel arch configuration. No arch or great vessel origin atherosclerosis.  Normal right CCA origin. Normal right CCA. Widely patent right carotid bifurcation. Negative cervical right ICA.  No proximal right subclavian  artery stenosis. Normal right vertebral artery stenosis. Negative cervical right vertebral artery.  Negative left CCA origin. Negative left CCA. Widely patent left carotid bifurcation. Negative cervical left ICA.  No proximal left subclavian artery stenosis. Normal left vertebral artery origin. Negative cervical left vertebral artery.  Review of the MIP images confirms the above findings.  IMPRESSION: 1. Negative CTA head and neck. 2.  Normal CT appearance of the brain. 3. Chest CTA findings from today reported separately.   Electronically Signed   By: Lars Pinks M.D.   On: 01/01/2014 21:51   Ct Angio Neck W/cm &/or Wo/cm  01/01/2014   CLINICAL DATA:  43 year old male with upper extremity weakness, numbness, abnormal gait. Marked and syndrome. Initial encounter.  EXAM: CT ANGIOGRAPHY HEAD AND NECK  TECHNIQUE: Multidetector CT imaging of the head and neck was performed using the standard protocol during bolus administration of intravenous contrast. Multiplanar CT image reconstructions and MIPs were obtained to evaluate the vascular anatomy. Carotid stenosis measurements (when applicable) are obtained utilizing NASCET criteria, using the distal internal carotid diameter as the denominator.  CONTRAST:  128mL OMNIPAQUE IOHEXOL 350 MG/ML SOLN  COMPARISON:  Paranasal sinus CT 06/26/2012. Chest CTA findings from the same day reported separately.  FINDINGS: CTA HEAD FINDINGS  Calvarium intact. Negative scalp soft tissues. Cerebral volume is normal. No midline shift, ventriculomegaly, mass effect, evidence of mass lesion, intracranial hemorrhage or evidence of cortically based acute infarction. Gray-white matter differentiation is within normal limits throughout the brain. No abnormal enhancement identified.  VASCULAR FINDINGS:  Codominant distal vertebral arteries are normal. Normal PICA origins. Patent vertebrobasilar junction. No basilar stenosis. SCA and PCA origins are within normal limits. Normal right posterior  communicating artery, the left is diminutive or absent. Bilateral PCA branches are within normal limits.  Bilateral ICA siphons are patent and within normal limits. Ophthalmic and right posterior communicating artery origins are within normal limits. Normal carotid termini. Normal MCA and ACA origins.  Mildly tortuous but otherwise negative proximal ACA vessels. Normal anterior communicating artery. Bilateral ACA branches are within normal limits. Right MCA branches are within normal limits. Left MCA branches are within normal limits.  Review of the MIP images confirms the above findings.  CTA NECK FINDINGS  Chest CTA findings from today are reported separately.  Negative thyroid, larynx, parapharyngeal spaces, retropharyngeal space, sublingual space, submandibular glands, and parotid glands. Pharynx remarkable for hypertrophy of the lingual tonsil. Small retention cyst of the left tonsillar pillar. Visualized orbit soft tissues are within normal limits. Fluid in the maxillary sinuses with mild to moderate paranasal sinus mucosal thickening elsewhere. Mastoids and tympanic cavities are clear. No acute osseous abnormality identified. Degenerative changes in lower cervical spine.  VASCULAR FINDINGS:  3 vessel arch configuration. No arch or great vessel origin atherosclerosis.  Normal right CCA origin. Normal right CCA. Widely patent right carotid bifurcation. Negative cervical right ICA.  No proximal right subclavian  artery stenosis. Normal right vertebral artery stenosis. Negative cervical right vertebral artery.  Negative left CCA origin. Negative left CCA. Widely patent left carotid bifurcation. Negative cervical left ICA.  No proximal left subclavian artery stenosis. Normal left vertebral artery origin. Negative cervical left vertebral artery.  Review of the MIP images confirms the above findings.  IMPRESSION: 1. Negative CTA head and neck. 2.  Normal CT appearance of the brain. 3. Chest CTA findings from today  reported separately.   Electronically Signed   By: Lars Pinks M.D.   On: 01/01/2014 21:51   Ct Angio Chest Aortic Dissect W &/or W/o  01/01/2014   CLINICAL DATA:  Marfan's syndrome. Weakness. Numbness. Question dissection.  EXAM: CT ANGIOGRAPHY CHEST, ABDOMEN AND PELVIS  TECHNIQUE: Multidetector CT imaging through the chest, abdomen and pelvis was performed using the standard protocol during bolus administration of intravenous contrast. Multiplanar reconstructed images and MIPs were obtained and reviewed to evaluate the vascular anatomy.  CONTRAST:  164mL OMNIPAQUE IOHEXOL 350 MG/ML SOLN  COMPARISON:  CT angio of the abdomen 10/04/2005  FINDINGS: CTA CHEST FINDINGS  Postsurgical changes noted from aortic valve surgery and aortic root replacement. No evidence of aortic aneurysm or dissection. Heart is normal size. No coronary artery calcifications.  No mediastinal, hilar, or axillary adenopathy. Chest wall soft tissues are unremarkable.  Mild paraseptal emphysema in the apices. Minimal dependent ground-glass densities, likely dependent atelectasis. No pleural effusions. No confluent opacities. No pneumothorax.  Review of the MIP images confirms the above findings.  CTA ABDOMEN AND PELVIS FINDINGS  Atherosclerotic calcifications in the aorta and iliac vessels. No aneurysm, dissection or focal stenosis. Mesenteric vessels and single renal arteries are widely patent.  Small hyperdense area noted posteriorly in the right hepatic lobe likely represents flash filling of a small hemangioma or other benign process. Liver, spleen, pancreas, adrenals and kidneys otherwise unremarkable. No hydronephrosis. No biliary ductal dilatation.  Stomach, large and small bowel are unremarkable. Urinary bladder and prostate are normal appearance.  No acute or focal bony abnormality. Degenerative changes in the lower lumbar spine.  Review of the MIP images confirms the above findings.  IMPRESSION: No evidence of aortic aneurysm or  dissection.  Mild paraseptal emphysema.  Prior aortic valve and aortic root replacement.  No acute findings in the chest, abdomen or pelvis.   Electronically Signed   By: Rolm Baptise M.D.   On: 01/01/2014 21:30   Ct Angio Abd/pel W/ And/or W/o  01/01/2014   CLINICAL DATA:  Marfan's syndrome. Weakness. Numbness. Question dissection.  EXAM: CT ANGIOGRAPHY CHEST, ABDOMEN AND PELVIS  TECHNIQUE: Multidetector CT imaging through the chest, abdomen and pelvis was performed using the standard protocol during bolus administration of intravenous contrast. Multiplanar reconstructed images and MIPs were obtained and reviewed to evaluate the vascular anatomy.  CONTRAST:  126mL OMNIPAQUE IOHEXOL 350 MG/ML SOLN  COMPARISON:  CT angio of the abdomen 10/04/2005  FINDINGS: CTA CHEST FINDINGS  Postsurgical changes noted from aortic valve surgery and aortic root replacement. No evidence of aortic aneurysm or dissection. Heart is normal size. No coronary artery calcifications.  No mediastinal, hilar, or axillary adenopathy. Chest wall soft tissues are unremarkable.  Mild paraseptal emphysema in the apices. Minimal dependent ground-glass densities, likely dependent atelectasis. No pleural effusions. No confluent opacities. No pneumothorax.  Review of the MIP images confirms the above findings.  CTA ABDOMEN AND PELVIS FINDINGS  Atherosclerotic calcifications in the aorta and iliac vessels. No aneurysm, dissection or focal stenosis. Mesenteric vessels and single  renal arteries are widely patent.  Small hyperdense area noted posteriorly in the right hepatic lobe likely represents flash filling of a small hemangioma or other benign process. Liver, spleen, pancreas, adrenals and kidneys otherwise unremarkable. No hydronephrosis. No biliary ductal dilatation.  Stomach, large and small bowel are unremarkable. Urinary bladder and prostate are normal appearance.  No acute or focal bony abnormality. Degenerative changes in the lower lumbar  spine.  Review of the MIP images confirms the above findings.  IMPRESSION: No evidence of aortic aneurysm or dissection.  Mild paraseptal emphysema.  Prior aortic valve and aortic root replacement.  No acute findings in the chest, abdomen or pelvis.   Electronically Signed   By: Rolm Baptise M.D.   On: 01/01/2014 21:30    MDM   Final diagnoses:  None    This is a 44 y.o. male with a past medical history of Marfan's disease status post aortic aneurysm, aortic root replacement, aortic valve replacement, presenting today with ataxia as well as weakness, numbness. Patient was at home yesterday when he appreciated a sensation described as "I just felt like I was leaning to the left when I was walking." This resolved spontaneously after 30 minutes. Patient went to bed without symptoms. Prior to arrival patient experienced bilateral upper extremity weakness and numbness.  This resolves spontaneously after one hour. No meds taken. No radiation. Patient denies chest pain shortness of breath, vision change, change in speech, abdominal pain, nausea, vomiting, fever. Patient denies any change in his bowel or bladder function and has no complaints pertaining to his lower extremities.  Patient is currently asymptomatic  Pulses are 2+ and equal at the carotids, radials, and lower extremities. Patient has no neurologic deficits on my examination. Obviously, considering his history of Marfan's disease as well as evidence of severe phenotype (status post aortic root replacement, aortic valve replacement), as well as transient, migratory neurologic complaints, I'm concerned for aortic etiology at this time. However, I am also considering stroke, mass, ICH, cardiac etiology, electrolyte abnormality, infection.  Metabolic panel, EKG, urinalysis, CBC, CTA of head, neck, chest, abdomen, and pelvis have been ordered. We'll continue to monitor closely. Patient currently remains stable.   Date: 01/02/2014  Rate: 83  Rhythm:  normal sinus rhythm  QRS Axis: normal  Intervals: normal  ST/T Wave abnormalities: nonspecific T wave changes  Conduction Disutrbances:none  Narrative Interpretation:  LVH, RA enlargement, nonspecific T wave abnormality  Old EKG Reviewed: none available  CTA of after mentioned areas are all negative for dissection or aneurysm. There are no signs or symptoms of infection at this time. EKG and troponin are not concerning for ACS. Patient is not anemic, there are no metabolic abnormalities appreciated. The patient is not hypoglycemic. His neurologic and vascular examinations remain within normal limits. I've spoken with neurology on call who does not recommend further emergent or inpatient evaluation or treatment at this time. He recommends discharge with followup within the next week for nerve conduction test. Pt stable for discharge, FU.  All questions answered.  Return precautions given.  I have discussed case and care has been guided by my attending physician, Dr. Stevie Kern.  Doy Hutching, MD 01/02/14 [phone]

## 2014-01-06 NOTE — ED Provider Notes (Signed)
I saw and evaluated the patient, reviewed the resident's note and I agree with the findings and plan.   EKG Interpretation   Date/Time:  Saturday January 01 2014 16:40:57 EDT Ventricular Rate:  83 PR Interval:  134 QRS Duration: 96 QT Interval:  366 QTC Calculation: 430 R Axis:   83 Text Interpretation:  Normal sinus rhythm Right atrial enlargement Minimal  voltage criteria for LVH, may be normal variant Nonspecific T wave  abnormality Abnormal ECG ED PHYSICIAN INTERPRETATION AVAILABLE IN [facility] Confirmed by TEST, Record ([id number]) on 01/03/2014 7:16:09 AM     Transient bilateral neuro Sxs r/o dissection.  Babette Relic, MD 01/06/14 [phone]

## 2014-08-05 ENCOUNTER — Emergency Department (HOSPITAL_COMMUNITY)
Admission: EM | Admit: 2014-08-05 | Discharge: 2014-08-05 | Disposition: A | Discharge status: Home / Self Care | Payer: Federal, State, Local not specified - PPO | Attending: Emergency Medicine | Admitting: Emergency Medicine

## 2014-08-05 ENCOUNTER — Encounter (HOSPITAL_COMMUNITY): Payer: Self-pay | Admitting: *Deleted

## 2014-08-05 ENCOUNTER — Telehealth: Payer: Self-pay | Admitting: Cardiovascular Disease

## 2014-08-05 ENCOUNTER — Emergency Department (HOSPITAL_COMMUNITY): Payer: Federal, State, Local not specified - PPO

## 2014-08-05 DIAGNOSIS — R0789 Other chest pain: Secondary | ICD-10-CM | POA: Diagnosis present

## 2014-08-05 DIAGNOSIS — R079 Chest pain, unspecified: Secondary | ICD-10-CM

## 2014-08-05 DIAGNOSIS — Z720 Tobacco use: Secondary | ICD-10-CM | POA: Diagnosis not present

## 2014-08-05 DIAGNOSIS — Z7982 Long term (current) use of aspirin: Secondary | ICD-10-CM | POA: Diagnosis not present

## 2014-08-05 DIAGNOSIS — Q8740 Marfan's syndrome, unspecified: Secondary | ICD-10-CM | POA: Insufficient documentation

## 2014-08-05 DIAGNOSIS — R011 Cardiac murmur, unspecified: Secondary | ICD-10-CM | POA: Insufficient documentation

## 2014-08-05 LAB — BASIC METABOLIC PANEL
Anion gap: 12 (ref 5–15)
BUN: 17 mg/dL (ref 6–23)
CALCIUM: 9.5 mg/dL (ref 8.4–10.5)
CO2: 24 meq/L (ref 19–32)
CREATININE: 1.21 mg/dL (ref 0.50–1.35)
Chloride: 105 mEq/L (ref 96–112)
GFR calc Af Amer: 84 mL/min — ABNORMAL LOW (ref >90)
GFR calc non Af Amer: 72 mL/min — ABNORMAL LOW (ref >90)
GLUCOSE: 89 mg/dL (ref 70–99)
Potassium: 4.1 mEq/L (ref 3.7–5.3)
Sodium: 141 mEq/L (ref 137–147)

## 2014-08-05 LAB — CBC
HCT: 37.8 % — ABNORMAL LOW (ref 39.0–52.0)
HEMOGLOBIN: 12.7 g/dL — AB (ref 13.0–17.0)
MCH: 31.8 pg (ref 26.0–34.0)
MCHC: 33.6 g/dL (ref 30.0–36.0)
MCV: 94.5 fL (ref 78.0–100.0)
Platelets: 204 10*3/uL (ref 150–400)
RBC: 4 MIL/uL — ABNORMAL LOW (ref 4.22–5.81)
RDW: 12.9 % (ref 11.5–15.5)
WBC: 8.2 10*3/uL (ref 4.0–10.5)

## 2014-08-05 LAB — TROPONIN I

## 2014-08-05 MED ORDER — IBUPROFEN 800 MG PO TABS
800.0000 mg | ORAL_TABLET | Freq: Once | ORAL | Status: AC
  Administered 2014-08-05: 800 mg via ORAL
  Filled 2014-08-05: qty 1

## 2014-08-05 MED ORDER — HYDROCODONE-ACETAMINOPHEN 5-325 MG PO TABS: 2.0000 | ORAL_TABLET | ORAL | Status: DC | PRN

## 2014-08-05 MED ORDER — NAPROXEN 500 MG PO TABS: 500.0000 mg | ORAL_TABLET | Freq: Two times a day (BID) | ORAL | Status: DC

## 2014-08-05 MED ORDER — HYDROCODONE-ACETAMINOPHEN 5-325 MG PO TABS
1.0000 | ORAL_TABLET | Freq: Once | ORAL | Status: AC
  Administered 2014-08-05: 1 via ORAL
  Filled 2014-08-05: qty 1

## 2014-08-05 NOTE — Discharge Instructions (Signed)

## 2014-08-05 NOTE — ED Notes (Signed)
Pt ambulating independently w/ steady gait on d/c in no acute distress, A&Ox4. D/c instructions reviewed w/ pt and family - pt and family deny any further questions or concerns at present. Rx given x2  

## 2014-08-05 NOTE — Telephone Encounter (Signed)
Christopher Ball is calling because he is feeling a lot pressure to his chest with some pain. The pressure is constantly there as well as when he moves around . Please Call   Thanks

## 2014-08-05 NOTE — Telephone Encounter (Signed)
Spoke to patient He states he is having heaviness in his chest,chest pain 1-4 level. Pain has been present for about an 1 hour- no shortness of breathe, no nausea. 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 His last visit to office was in 2012.  RN informed patient to go to [facility] to be evaluated Patient verbalized understanding.

## 2014-08-05 NOTE — ED Notes (Signed)
Pt placed on monitor. Pt monitored by blood pressure, pulse ox, and 5 lead. pts family remains at bedside.

## 2014-08-05 NOTE — ED Provider Notes (Signed)
CSN: [id number]     Arrival date & time 08/05/14  1501 History   First MD Initiated Contact with Patient 08/05/14 1616     Chief Complaint  Patient presents with  . Chest Pain      HPI  Patient reports pain along his sternum and his anterior chest. States it hurt for a few minutes chest today. States it happened again today. Today he was at work. He had taken a Charity fundraiser and put her on to a bobcat, epis of equipment at work. No heavy lifting. Some awkward positioning. However no fall or injury or trauma. States it hurts in a very small area in his anterior chest along his sternum. It hurts to touch. He's not been ill with cough shortness of breath or pain. No fevers. No swelling. No neck or back or jaw pain.  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  Past Medical History  Diagnosis Date  . Heart disease   . Heart murmur   . Marfan syndrome    Past Surgical History  Procedure Laterality Date  . Vasectomy  09/16/2002  . Aortic valve surgery    . Aortic root replacement     Family History  Problem Relation Age of Onset  . Asthma Mother   . Diabetes Mother   . Early death Father   . Heart disease Father   . Asthma Brother   . Cancer Neg Hx   . Stroke Neg Hx   . Kidney disease Neg Hx   . Hypertension Neg Hx   . Hyperlipidemia Neg Hx    History  Substance Use Topics  . Smoking status: Current Every Day Smoker -- 1.00 packs/day for 20 years  . Smokeless tobacco: Never Used  . Alcohol Use: 0.6 oz/week    1 Shots of liquor per week    Review of Systems  Constitutional: Negative for fever, chills, diaphoresis, appetite change and fatigue.  HENT: Negative for mouth sores, sore throat and trouble swallowing.   Eyes: Negative for visual disturbance.  Respiratory: Negative for cough, chest tightness, shortness of breath and wheezing.   Cardiovascular: Positive for chest pain.  Gastrointestinal: Negative for nausea, vomiting, abdominal  pain, diarrhea and abdominal distention.  Endocrine: Negative for polydipsia, polyphagia and polyuria.  Genitourinary: Negative for dysuria, frequency and hematuria.  Musculoskeletal: Negative for gait problem.  Skin: Negative for color change, pallor and rash.  Neurological: Negative for dizziness, syncope, light-headedness and headaches.  Hematological: Does not bruise/bleed easily.  Psychiatric/Behavioral: Negative for behavioral problems and confusion.      Allergies  Augmentin  Home Medications   Prior to Admission medications   Medication Sig Start Date End Date Taking? Authorizing Provider  aspirin EC 81 MG tablet Take 81 mg by mouth at bedtime.   Yes Historical Provider, MD  fluticasone (FLONASE) 50 MCG/ACT nasal spray Place 2 sprays into both nostrils at bedtime.   Yes Historical Provider, MD  HYDROcodone-acetaminophen (NORCO/VICODIN) 5-325 MG per tablet Take 2 tablets by mouth every 4 (four) hours as needed. 08/05/14   Tanna Furry, MD  naproxen (NAPROSYN) 500 MG tablet Take 1 tablet (500 mg total) by mouth 2 (two) times daily. 08/05/14   Tanna Furry, MD   BP 128/75 mmHg  Pulse 57  Resp 13  Ht 6\' 6"  (1.981 m)  Wt 140 lb (63.504 kg)  BMI 16.18 kg/m2  SpO2 96% Physical Exam  Constitutional: He is oriented to person, place, and time. He appears  well-developed and well-nourished. No distress.  Tall thin adult male. Awake and alert.  HENT:  Head: Normocephalic.  Eyes: Conjunctivae are normal. Pupils are equal, round, and reactive to light. No scleral icterus.  Neck: Normal range of motion. Neck supple. No thyromegaly present.  Cardiovascular: Normal rate and regular rhythm.  Exam reveals no gallop and no friction rub.   No murmur heard. Pulmonary/Chest: Effort normal and breath sounds normal. No respiratory distress. He has no wheezes. He has no rales.    Abdominal: Soft. Bowel sounds are normal. He exhibits no distension. There is no tenderness. There is no rebound.    Musculoskeletal: Normal range of motion.  Neurological: He is alert and oriented to person, place, and time.  Skin: Skin is warm and dry. No rash noted.  Psychiatric: He has a normal mood and affect. His behavior is normal.    ED Course  Procedures (including critical care time) Labs Review Labs Reviewed  CBC - Abnormal; Notable for the following:    RBC 4.00 (*)    Hemoglobin 12.7 (*)    HCT 37.8 (*)    All other components within normal limits  BASIC METABOLIC PANEL - Abnormal; Notable for the following:    GFR calc non Af Amer 72 (*)    GFR calc Af Amer 84 (*)    All other components within normal limits  TROPONIN I  I-STAT TROPOININ, ED    Imaging Review Dg Chest 2 View  08/05/2014   CLINICAL DATA:  Initial encounter for mid to left-sided chest pain starting earlier today.  EXAM: CHEST  2 VIEW  COMPARISON:  05/15/2012.  FINDINGS: Marked hyperexpansion is compatible with emphysema. Biapical pleural parenchymal scarring is again noted. There is no edema or focal airspace consolidation. No pneumothorax or pleural effusion. The cardiopericardial silhouette is within normal limits for size. Patient is status post median sternotomy in the caudal most wire is fractured, as before. Imaged bony structures of the thorax are intact.  IMPRESSION: Stable.  Emphysema without acute cardiopulmonary findings.   Electronically Signed   By: Misty Stanley M.D.   On: 08/05/2014 15:38     EKG Interpretation None      MDM   Final diagnoses:  Chest wall pain    On exam pain is clearly chest wall. He has one fractured sternal wire from his sternotomy right wearing. This is several fingerbreadths below where his reported area of pain is. No abdomen always the skin noted. He has very thin soft tissue overlying his sternal wires. There is no breakdown of the skin. However, they are easily palpable.  This may be simple costochondral pain. This may be post sternotomy pain. I'm not concerned that this  represents an abnormality internally in his chest. Doubt ACS, dissection, PE, pneumonia. Plan will be anti-inflammatories, supple pain medicine. Primary care follow-up.  Troponin at 1750 normal. Plan is discharge home. Symptomatically treatment. Avoid heavy lifting and activities. Primary care follow-up.   Tanna Furry, MD 08/05/14 1910

## 2014-08-05 NOTE — ED Notes (Signed)
Pt reports onset of left side chest pain yesterday and pain increases with palpation and movement. Denies cough or sob, ekg done at triage and airway intact.

## 2014-08-17 ENCOUNTER — Encounter: Payer: Self-pay | Admitting: Cardiovascular Disease

## 2014-08-17 ENCOUNTER — Ambulatory Visit (INDEPENDENT_AMBULATORY_CARE_PROVIDER_SITE_OTHER): Payer: Federal, State, Local not specified - PPO | Admitting: Cardiovascular Disease

## 2014-08-17 VITALS — BP 124/68 | HR 66 | Ht 78.0 in | Wt 163.8 lb

## 2014-08-17 DIAGNOSIS — Q87410 Marfan's syndrome with aortic dilation: Secondary | ICD-10-CM | POA: Insufficient documentation

## 2014-08-17 DIAGNOSIS — R079 Chest pain, unspecified: Secondary | ICD-10-CM

## 2014-08-17 DIAGNOSIS — I712 Thoracic aortic aneurysm, without rupture, unspecified: Secondary | ICD-10-CM | POA: Insufficient documentation

## 2014-08-17 DIAGNOSIS — Z954 Presence of other heart-valve replacement: Secondary | ICD-10-CM

## 2014-08-17 DIAGNOSIS — Z952 Presence of prosthetic heart valve: Secondary | ICD-10-CM | POA: Insufficient documentation

## 2014-08-17 DIAGNOSIS — Q8740 Marfan's syndrome, unspecified: Secondary | ICD-10-CM | POA: Insufficient documentation

## 2014-08-17 NOTE — Patient Instructions (Signed)
Dr. Gwenlyn Found has ordered an Echocardiogram. Echocardiography is a painless test that uses sound waves to create images of your heart. It provides your doctor with information about the size and shape of your heart and how well your heart's chambers and valves are working. This procedure takes approximately one hour. There are no restrictions for this procedure.  Your physician wants you to follow-up in 1 year with Dr. Gwenlyn Found. You will receive a reminder letter in the mail 2 months in advance. If you do not receive a letter, please call our office to schedule the follow-up appointment.

## 2014-08-17 NOTE — Progress Notes (Signed)
08/17/2014 Christopher Ball   1971/07/29  [id number]  Primary Physician [name], MD Primary Cardiologist: [name] MD [name]   HPI:  Christopher Ball is a 43 year old tall and thin appearing married Caucasian male father of 2 children who does not work. I left side him in the office 02/22/09. He has a history of Marfan syndrome and underwent cardiac catheterization by myself 12/04/05 revealing normal coronary arteries and normal LV function. He ultimately underwent thoracic aortic aneurysm resection and grafting on 01/13/06 by Dr. [name] with aortic valve replacement using a bioprosthesis and coronary artery reimplantation. As of the problems include continued tobacco abuse 1 pack per day. He did have a chest CTA performed in April of this year that was negative for dissection. He recently complained of chest pain which is musculoskeletal and was evaluated in the emergency room 08/05/14 without a diagnosis. His pain has since resolved.   Current Outpatient Prescriptions  Medication Sig Dispense Refill  . aspirin EC 81 MG tablet Take 81 mg by mouth at bedtime.    . fluticasone (FLONASE) 50 MCG/ACT nasal spray Place 2 sprays into both nostrils at bedtime.    Marland Kitchen HYDROcodone-acetaminophen (NORCO/VICODIN) 5-325 MG per tablet Take 2 tablets by mouth every 4 (four) hours as needed. 10 tablet 0  . naproxen (NAPROSYN) 500 MG tablet Take 1 tablet (500 mg total) by mouth 2 (two) times daily. 30 tablet 0   No current facility-administered medications for this visit.    Allergies  Allergen Reactions  . Augmentin [Amoxicillin-Pot Clavulanate] Shortness Of Breath and Nausea And Vomiting    History   Social History  . Marital Status: Married    Spouse Name: N/A    Number of Children: N/A  . Years of Education: N/A   Occupational History  . Not on file.   Social History Main Topics  . Smoking status: Current Every Day Smoker -- 1.00 packs/day for 20 years  . Smokeless  tobacco: Never Used  . Alcohol Use: 0.6 oz/week    1 Shots of liquor per week  . Drug Use: No  . Sexual Activity: Yes   Other Topics Concern  . Not on file   Social History Narrative     Review of Systems: General: negative for chills, fever, night sweats or weight changes.  Cardiovascular: negative for chest pain, dyspnea on exertion, edema, orthopnea, palpitations, paroxysmal nocturnal dyspnea or shortness of breath Dermatological: negative for rash Respiratory: negative for cough or wheezing Urologic: negative for hematuria Abdominal: negative for nausea, vomiting, diarrhea, bright red blood per rectum, melena, or hematemesis Neurologic: negative for visual changes, syncope, or dizziness All other systems reviewed and are otherwise negative except as noted above.    Blood pressure 124/68, pulse 66, height 6\' 6"  (1.981 m), weight 163 lb 12.8 oz (74.299 kg).  General appearance: alert and no distress Neck: no adenopathy, no carotid bruit, no JVD, supple, symmetrical, trachea midline and thyroid not enlarged, symmetric, no tenderness/mass/nodules Lungs: clear to auscultation bilaterally Heart: regular rate and rhythm, S1, S2 normal, no murmur, click, rub or gallop Extremities: extremities normal, atraumatic, no cyanosis or edema  EKG normal sinus rhythm at 66 with biatrial enlargement and early repolarization changes with left ventricular hypertrophy. I personally reviewed this EKG  ASSESSMENT AND PLAN:   Marfan syndrome Christopher Ball is a 43 year old thin, tall and lanky married Caucasian male with a history of Marfan syndrome. He's had spontaneous pneumothoraces back in the late 90s. He had a thoracic  aortic aneurysm which was diagnosed back in 2007 with a cardiac catheterization that showed normal coronary arteries. He also underwent thoracic aortic aneurysm resection and grafting along with a bioprosthetic aortic valve replacement and coronary reimplantation after cardiac  catheterization showed normal coronary arteries.  Thoracic aortic aneurysm Christopher Ball had a large thoracic aortic aneurysm which underwent resection and grafting by Dr. [name] 01/13/06. He had his aortic valve replacement with bioprosthesis and his coronary arteries reimplanted. He has a chest CT angiogram in April of this year that showed no evidence of dissection. He recently has complained of atypical musculoskeletal chest pain and was evaluated in the emergency room on 11/20.      [name] MD FACP,FACC,FAHA, [name] 08/17/2014 9:22 AM

## 2014-08-17 NOTE — Assessment & Plan Note (Signed)
Christopher Ball had a large thoracic aortic aneurysm which underwent resection and grafting by Dr. Valentina Lucks 01/13/06. He had his aortic valve replacement with bioprosthesis and his coronary arteries reimplanted. He has a chest CT angiogram in April of this year that showed no evidence of dissection. He recently has complained of atypical musculoskeletal chest pain and was evaluated in the emergency room on 11/20.

## 2014-08-17 NOTE — Assessment & Plan Note (Signed)
Christopher Ball is a 43 year old thin, tall and lanky married Caucasian male with a history of Marfan syndrome. He's had spontaneous pneumothoraces back in the late 90s. He had a thoracic aortic aneurysm which was diagnosed back in 2007 with a cardiac catheterization that showed normal coronary arteries. He also underwent thoracic aortic aneurysm resection and grafting along with a bioprosthetic aortic valve replacement and coronary reimplantation after cardiac catheterization showed normal coronary arteries.

## 2014-08-22 ENCOUNTER — Ambulatory Visit (HOSPITAL_COMMUNITY)
Admission: RE | Admit: 2014-08-22 | Discharge: 2014-08-22 | Disposition: A | Discharge status: Home / Self Care | Payer: Federal, State, Local not specified - PPO | Source: Ambulatory Visit | Attending: Cardiology | Admitting: Cardiology

## 2014-08-22 DIAGNOSIS — R0789 Other chest pain: Secondary | ICD-10-CM | POA: Diagnosis present

## 2014-08-22 DIAGNOSIS — R079 Chest pain, unspecified: Secondary | ICD-10-CM

## 2014-08-22 DIAGNOSIS — Z952 Presence of prosthetic heart valve: Secondary | ICD-10-CM

## 2014-08-22 DIAGNOSIS — I059 Rheumatic mitral valve disease, unspecified: Secondary | ICD-10-CM

## 2014-08-22 NOTE — Progress Notes (Signed)
2D Echo Performed 10/12/2013    Tamanika Heiney, RCS  

## 2014-08-24 ENCOUNTER — Encounter: Payer: Self-pay | Admitting: *Deleted

## 2014-10-09 ENCOUNTER — Other Ambulatory Visit: Payer: Self-pay | Admitting: Internal Medicine

## 2014-10-12 ENCOUNTER — Ambulatory Visit (INDEPENDENT_AMBULATORY_CARE_PROVIDER_SITE_OTHER): Payer: Federal, State, Local not specified - PPO | Admitting: Internal Medicine

## 2014-10-12 ENCOUNTER — Telehealth: Payer: Self-pay | Admitting: Internal Medicine

## 2014-10-12 ENCOUNTER — Encounter: Payer: Self-pay | Admitting: Internal Medicine

## 2014-10-12 VITALS — BP 126/72 | HR 64 | Temp 97.6°F | Resp 16 | Ht 78.0 in | Wt 162.0 lb

## 2014-10-12 DIAGNOSIS — J321 Chronic frontal sinusitis: Secondary | ICD-10-CM

## 2014-10-12 DIAGNOSIS — J3089 Other allergic rhinitis: Secondary | ICD-10-CM

## 2014-10-12 MED ORDER — LEVOCETIRIZINE DIHYDROCHLORIDE 5 MG PO TABS: 5.0000 mg | ORAL_TABLET | Freq: Every evening | ORAL | Status: DC

## 2014-10-12 MED ORDER — BECLOMETHASONE DIPROPIONATE 80 MCG/ACT NA AERS: 4.0000 | INHALATION_SPRAY | Freq: Every day | NASAL | Status: DC

## 2014-10-12 MED ORDER — MOXIFLOXACIN HCL 400 MG PO TABS: 400.0000 mg | ORAL_TABLET | Freq: Every day | ORAL | Status: DC

## 2014-10-12 MED ORDER — METHYLPREDNISOLONE ACETATE 80 MG/ML IJ SUSP
80.0000 mg | Freq: Once | INTRAMUSCULAR | Status: AC
  Administered 2014-10-12: 80 mg via INTRAMUSCULAR

## 2014-10-12 MED ORDER — MOMETASONE FUROATE 50 MCG/ACT NA SUSP: 4.0000 | Freq: Every day | NASAL | Status: DC

## 2014-10-12 MED ORDER — MONTELUKAST SODIUM 10 MG PO TABS: 10.0000 mg | ORAL_TABLET | Freq: Every day | ORAL | Status: DC

## 2014-10-12 NOTE — Progress Notes (Signed)
Subjective:    Patient ID: Christopher Ball, male    DOB: March 04, 1971, 44 y.o.   MRN: [id number]  HPI  He returns and complains that for the last few weeks he has had worsening left facial pain and nasal allergy symptoms. He does not think that flonase is helping much.  Review of Systems  Constitutional: Negative.  Negative for fever, chills, diaphoresis, appetite change and fatigue.  HENT: Positive for congestion, postnasal drip, rhinorrhea and sinus pressure. Negative for dental problem, ear pain, hearing loss, nosebleeds, sneezing, sore throat, trouble swallowing and voice change.   Eyes: Negative.   Respiratory: Negative.  Negative for cough, choking, chest tightness, shortness of breath and stridor.   Cardiovascular: Negative.  Negative for chest pain, palpitations and leg swelling.  Gastrointestinal: Negative.  Negative for abdominal pain.  Endocrine: Negative.   Genitourinary: Negative.   Musculoskeletal: Negative.   Skin: Negative.  Negative for rash.  Allergic/Immunologic: Negative.   Neurological: Negative.   Hematological: Negative.  Negative for adenopathy. Does not bruise/bleed easily.  Psychiatric/Behavioral: Negative.        Objective:   Physical Exam  Constitutional: He is oriented to person, place, and time. He appears well-developed and well-nourished.  Non-toxic appearance. He does not have a sickly appearance. He does not appear ill. No distress.  HENT:  Head: Normocephalic and atraumatic.  Right Ear: Hearing, tympanic membrane, external ear and ear canal normal.  Left Ear: Hearing, tympanic membrane, external ear and ear canal normal.  Nose: Mucosal edema present. No rhinorrhea, sinus tenderness or nasal deformity. Right sinus exhibits no maxillary sinus tenderness and no frontal sinus tenderness. Left sinus exhibits maxillary sinus tenderness. Left sinus exhibits no frontal sinus tenderness.  Mouth/Throat: Oropharynx is clear and moist and mucous membranes are  normal. Mucous membranes are not pale, not dry and not cyanotic. No oral lesions. No trismus in the jaw. No uvula swelling. No oropharyngeal exudate, posterior oropharyngeal edema, posterior oropharyngeal erythema or tonsillar abscesses.  Eyes: Conjunctivae are normal. Right eye exhibits no discharge. Left eye exhibits no discharge. No scleral icterus.  Neck: Normal range of motion. Neck supple. No JVD present. No tracheal deviation present. No thyromegaly present.  Cardiovascular: Normal rate, regular rhythm, normal heart sounds and intact distal pulses.  Exam reveals no gallop and no friction rub.   No murmur heard. Pulmonary/Chest: Effort normal and breath sounds normal. No stridor. No respiratory distress. He has no wheezes. He has no rales. He exhibits no tenderness.  Abdominal: Soft. Bowel sounds are normal. He exhibits no distension and no mass. There is no tenderness. There is no rebound and no guarding.  Musculoskeletal: Normal range of motion. He exhibits no edema or tenderness.  Lymphadenopathy:    He has no cervical adenopathy.  Neurological: He is oriented to person, place, and time.  Skin: Skin is warm and dry. No rash noted. He is not diaphoretic. No erythema. No pallor.  Psychiatric: He has a normal mood and affect. His behavior is normal. Judgment and thought content normal.  Vitals reviewed.         Assessment & Plan:

## 2014-10-12 NOTE — Assessment & Plan Note (Signed)
He is having a flare of his symptoms and I think an intercurrent bacterial sinusitis, will treat the infection with avelox His previous CT in 2013 showed an A/F level on the left side so I will recheck the CT scan to see if this has resolved or worsened, or is there an obstruction of the OM complex that is causing his current symptoms

## 2014-10-12 NOTE — Assessment & Plan Note (Signed)
He is having a flare of his symptoms - I have him an injection of depo-medrol IM to reduce the inflammation and allergic symptoms, I have asked him to change the flonase to Qnasl and to start Xyzal

## 2014-10-12 NOTE — Telephone Encounter (Signed)
Patient called stating the prescriptions he was given today are not covered by his insurance. He is requesting that we send in alternatives. Pt uses [facility] on [address]. CB# [phone]

## 2014-10-12 NOTE — Progress Notes (Signed)
Pre visit review using our clinic review tool, if applicable. No additional management support is needed unless otherwise documented below in the visit note. 

## 2014-10-12 NOTE — Telephone Encounter (Signed)
Changes made

## 2014-10-12 NOTE — Patient Instructions (Signed)

## 2014-10-13 ENCOUNTER — Telehealth: Payer: Self-pay | Admitting: Internal Medicine

## 2014-10-13 NOTE — Telephone Encounter (Signed)
emmi mailed  °

## 2014-11-03 ENCOUNTER — Ambulatory Visit
Admission: RE | Admit: 2014-11-03 | Discharge: 2014-11-03 | Disposition: A | Discharge status: Home / Self Care | Payer: Federal, State, Local not specified - PPO | Source: Ambulatory Visit | Attending: Internal Medicine | Admitting: Internal Medicine

## 2014-11-03 ENCOUNTER — Encounter: Payer: Self-pay | Admitting: Internal Medicine

## 2014-11-03 DIAGNOSIS — J321 Chronic frontal sinusitis: Secondary | ICD-10-CM

## 2014-11-03 MED ORDER — IOHEXOL 300 MG/ML  SOLN
75.0000 mL | Freq: Once | INTRAMUSCULAR | Status: AC | PRN
  Administered 2014-11-03: 75 mL via INTRAVENOUS

## 2014-11-09 ENCOUNTER — Ambulatory Visit: Payer: Federal, State, Local not specified - PPO | Admitting: Internal Medicine

## 2015-05-22 ENCOUNTER — Encounter (HOSPITAL_COMMUNITY): Payer: Self-pay | Admitting: Emergency Medicine

## 2015-05-22 ENCOUNTER — Emergency Department (INDEPENDENT_AMBULATORY_CARE_PROVIDER_SITE_OTHER): Payer: Federal, State, Local not specified - PPO

## 2015-05-22 ENCOUNTER — Emergency Department (HOSPITAL_COMMUNITY)
Admission: EM | Admit: 2015-05-22 | Discharge: 2015-05-22 | Disposition: A | Discharge status: Home / Self Care | Payer: Federal, State, Local not specified - PPO | Source: Home / Self Care | Attending: Family Medicine | Admitting: Family Medicine

## 2015-05-22 DIAGNOSIS — S80851A Superficial foreign body, right lower leg, initial encounter: Secondary | ICD-10-CM

## 2015-05-22 DIAGNOSIS — R203 Hyperesthesia: Secondary | ICD-10-CM | POA: Diagnosis not present

## 2015-05-22 DIAGNOSIS — S80251A Superficial foreign body, right knee, initial encounter: Secondary | ICD-10-CM

## 2015-05-22 DIAGNOSIS — S8001XA Contusion of right knee, initial encounter: Secondary | ICD-10-CM

## 2015-05-22 DIAGNOSIS — S40022A Contusion of left upper arm, initial encounter: Secondary | ICD-10-CM

## 2015-05-22 MED ORDER — TETANUS-DIPHTH-ACELL PERTUSSIS 5-2.5-18.5 LF-MCG/0.5 IM SUSP
0.5000 mL | Freq: Once | INTRAMUSCULAR | Status: AC
  Administered 2015-05-22: 0.5 mL via INTRAMUSCULAR

## 2015-05-22 MED ORDER — TETANUS-DIPHTH-ACELL PERTUSSIS 5-2.5-18.5 LF-MCG/0.5 IM SUSP
INTRAMUSCULAR | Status: AC
  Filled 2015-05-22: qty 0.5

## 2015-05-22 MED ORDER — CLINDAMYCIN HCL 300 MG PO CAPS: 300.0000 mg | ORAL_CAPSULE | Freq: Three times a day (TID) | ORAL | Status: DC

## 2015-05-22 MED ORDER — HYDROCODONE-ACETAMINOPHEN 5-325 MG PO TABS
ORAL_TABLET | ORAL | Status: AC
  Filled 2015-05-22: qty 2

## 2015-05-22 MED ORDER — HYDROCODONE-ACETAMINOPHEN 7.5-325 MG PO TABS: 1.0000 | ORAL_TABLET | ORAL | Status: DC | PRN

## 2015-05-22 MED ORDER — HYDROCODONE-ACETAMINOPHEN 5-325 MG PO TABS
2.0000 | ORAL_TABLET | Freq: Once | ORAL | Status: AC
  Administered 2015-05-22: 2 via ORAL

## 2015-05-22 NOTE — ED Provider Notes (Signed)
CSN: [id number]     Arrival date & time 05/22/15  1831 History   First MD Initiated Contact with Patient 05/22/15 1939     Chief Complaint  Patient presents with  . Knee Injury  . Arm Injury   (Consider location/radiation/quality/duration/timing/severity/associated sxs/prior Treatment) HPI Comments: 44 year old male with Marfan syndrome presents complaining of severe pain 10 out of 10 to both his left forearm and right knee. He was using a hand drill and it slipped out of his hand swan beneath him and struck cam in the medial aspect of the right knee. He is complaining of severe pain to the medial aspect of the knee and beneath the knee. Also complaining of pain radiating medially down the lower leg. He points to a dilated vein and states that it is causing pain as well. He was ambulatory into the urgent care. This occurred approximately 1 hour prior to arrival to the urgent care at home.  He started to feel better for male has picked up the drill and then again accidentally struck himself in the left arm with the drill began causing severe 10 out of 10 pain in the left forearm.   Past Medical History  Diagnosis Date  . Heart disease   . Heart murmur   . Marfan syndrome   . Stomach problems   . Visual disorder   . Lung disease   . 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 Tobacco abuse    Past Surgical History  Procedure Laterality Date  . Vasectomy  09/16/2002  . Aortic valve surgery    . Aortic root replacement    . Lung surgery      left  . Aortic valve replacement    . Doppler echocardiography     Family History  Problem Relation Age of Onset  . Asthma Mother   . Diabetes Mother   . Early death Father   . Heart disease Father   . Asthma Brother   . Cancer Neg Hx   . Stroke Neg Hx   . Kidney disease Neg Hx   . Hypertension Neg Hx   . Hyperlipidemia Neg Hx    Social History  Substance Use Topics  .  Smoking status: Current Every Day Smoker -- 1.00 packs/day for 20 years  . Smokeless tobacco: Never Used  . Alcohol Use: 0.6 oz/week    1 Shots of liquor per week    Review of Systems  Constitutional: Negative.   HENT: Negative.   Respiratory: Negative.   Gastrointestinal: Negative.   Genitourinary: Negative.   Musculoskeletal: Negative for back pain and neck pain.       As per HPI  Skin: Negative.  Negative for rash and wound.  Neurological: Negative for dizziness, weakness and numbness.  Psychiatric/Behavioral: Positive for agitation. The patient is nervous/anxious.     Allergies  Augmentin  Home Medications   Prior to Admission medications   Medication Sig Start Date End Date Taking? Authorizing Provider  mometasone (NASONEX) 50 MCG/ACT nasal spray Place 4 sprays into the nose daily. 10/12/14  Yes Janith Lima, MD  aspirin EC 81 MG tablet Take 81 mg by mouth at bedtime.    Historical Provider, MD  clindamycin (CLEOCIN) 300 MG capsule Take 1 capsule (300 mg total) by mouth 3 (three) times daily. 05/22/15   Janne Napoleon, NP  HYDROcodone-acetaminophen (NORCO) 7.5-325 MG per tablet Take 1 tablet by mouth every 4 (  four) hours as needed. 05/22/15   Janne Napoleon, NP  montelukast (SINGULAIR) 10 MG tablet Take 1 tablet (10 mg total) by mouth at bedtime. 10/12/14   Janith Lima, MD  moxifloxacin (AVELOX) 400 MG tablet Take 1 tablet (400 mg total) by mouth daily. 10/12/14   Janith Lima, MD  naproxen (NAPROSYN) 500 MG tablet Take 1 tablet (500 mg total) by mouth 2 (two) times daily. 08/05/14   Tanna Furry, MD   Meds Ordered and Administered this Visit   Medications  Tdap (BOOSTRIX) injection 0.5 mL (not administered)  HYDROcodone-acetaminophen (NORCO/VICODIN) 5-325 MG per tablet 2 tablet (2 tablets Oral Given 05/22/15 2029)    BP 150/72 mmHg  Pulse 73  SpO2 100% No data found.   Physical Exam  Constitutional: He is oriented to person, place, and time. He appears well-developed and  well-nourished.  HENT:  Head: Normocephalic and atraumatic.  Eyes: EOM are normal. Left eye exhibits no discharge.  Neck: Normal range of motion. Neck supple.  Pulmonary/Chest: Effort normal. No respiratory distress.  Musculoskeletal:  Patient ambulatory to the urgent care. Patient points to the medial aspect of the right knee is to the area of pain. Pain travels inferiorly down the medial aspect of the lower leg. He points to a dilated vein that is also painful. I am able to palpate the patella the anterior lateral aspect of the knee however the lightest palpation to the hair over the medial aspect of the knee and lower leg produces severe pain. Merely blowing air over the medial aspect of the knee produces a 10 out of 10 pain to the knee. Flickering one isolated hair to the medial aspect of the knee causes 10 out of 10 pain. No deformity. Minimal swelling. No open areas to the skin. No abrasions or lacerations. No ecchymosis. No swelling of the lower leg.  The left distal forearm is exquisitely tender over the ulnar aspect. Pain radiates distally to the fingertips. He rates the pain 10 out of 10 and with lightest palpation to the hair of the arm and blowing over the arm produces severe pain. Palpation is difficult due to patient's pain response. He is able to wiggle his fingers. He can extend and flex his wrist approximately 20 each way. Has normal color to the digits.  Neurological: He is alert and oriented to person, place, and time. No cranial nerve deficit.  Skin: Skin is warm and dry.  Psychiatric: He has a normal mood and affect.  Nursing note and vitals reviewed.   ED Course  Procedures (including critical care time)  Labs Review Labs Reviewed - No data to display  Imaging Review Dg Forearm Left  05/22/2015   CLINICAL DATA:  Puncture wound to medial RIGHT knee from handrail. Blunt trauma to forearm.  EXAM: LEFT FOREARM - 2 VIEW  COMPARISON:  None.  FINDINGS: No fracture of the  radius or ulna. The elbow joint and the wrist joint appear normal on two views.  IMPRESSION: No fracture or dislocation.   Electronically Signed   By: Suzy Bouchard M.D.   On: 05/22/2015 20:47   Dg Tibia/fibula Right  05/22/2015   CLINICAL DATA:  puncture injury to the proximal LEFT lower extremity. Distal leg pain  EXAM: RIGHT TIBIA AND FIBULA - 2 VIEW  COMPARISON:  None.  FINDINGS: No fracture of the tibia or fibula. Knee joint and ankle joint appear normal on two views. Small foreign body noted medial to the medial femoral condyles  IMPRESSION:  No fracture or dislocation. Foreign body medial to the medial femoral condyle   Electronically Signed   By: Suzy Bouchard M.D.   On: 05/22/2015 20:49   Dg Knee Complete 4 Views Right  05/22/2015   CLINICAL DATA:  Puncture wound with hand drill.  EXAM: RIGHT KNEE - COMPLETE 4+ VIEW  COMPARISON:  None.  FINDINGS: 2 x 4 mm metal foreign body in the subcutaneous tissues medially above the medial femoral condyle. No fracture. Knee joint is normal. No effusion.  IMPRESSION: Metal foreign body in the subcutaneous tissues medially.   Electronically Signed   By: Franchot Gallo M.D.   On: 05/22/2015 20:50     Visual Acuity Review  Right Eye Distance:   Left Eye Distance:   Bilateral Distance:    Right Eye Near:   Left Eye Near:    Bilateral Near:         MDM   1. Arm contusion, left, initial encounter   2. Knee contusion, right, initial encounter   3. Acute foreign body of right knee, initial encounter   4. Hyperesthesia    Tdap 0.5cc IM norco 10 mg po now norco 7.5 mg #15 L wrist immobilizer Clindamycin 300 milligrams for worsening new symptoms or problems such as signs of infection 6 medical attention promptly.  Patient will not allow additional physical examination, nor will he consider having the foreign body examined for removal. We will provide the above treatments and have him follow-up with the orthopedist on call on page one. He is  to call for an appointment.     Janne Napoleon, NP 05/22/15 2108

## 2015-05-22 NOTE — Discharge Instructions (Signed)
Blunt Trauma °You have been evaluated for injuries. You have been examined and your caregiver has not found injuries serious enough to require hospitalization. °It is common to have multiple bruises and sore muscles following an accident. These tend to feel worse for the first 24 hours. You will feel more stiffness and soreness over the next several hours and worse when you wake up the first morning after your accident. After this point, you should begin to improve with each passing day. The amount of improvement depends on the amount of damage done in the accident. °Following your accident, if some part of your body does not work as it should, or if the pain in any area continues to increase, you should return to the Emergency Department for re-evaluation.  °HOME CARE INSTRUCTIONS  °Routine care for sore areas should include: °· Ice to sore areas every 2 hours for 20 minutes while awake for the next 2 days. °· Drink extra fluids (not alcohol). °· Take a hot or warm shower or bath once or twice a day to increase blood flow to sore muscles. This will help you "limber up". °· Activity as tolerated. Lifting may aggravate neck or back pain. °· Only take over-the-counter or prescription medicines for pain, discomfort, or fever as directed by your caregiver. Do not use aspirin. This may increase bruising or increase bleeding if there are small areas where this is happening. °SEEK IMMEDIATE MEDICAL CARE IF: °· Numbness, tingling, weakness, or problem with the use of your arms or legs. °· A severe headache is not relieved with medications. °· There is a change in bowel or bladder control. °· Increasing pain in any areas of the body. °· Short of breath or dizzy. °· Nauseated, vomiting, or sweating. °· Increasing belly (abdominal) discomfort. °· Blood in urine, stool, or vomiting blood. °· Pain in either shoulder in an area where a shoulder strap would be. °· Feelings of lightheadedness or if you have a fainting  episode. °Sometimes it is not possible to identify all injuries immediately after the trauma. It is important that you continue to monitor your condition after the emergency department visit. If you feel you are not improving, or improving more slowly than should be expected, call your physician. If you feel your symptoms (problems) are worsening, return to the Emergency Department immediately. °Document Released: 05/29/2001 Document Revised: 11/25/2011 Document Reviewed: 04/20/2008 °ExitCare® Patient Information ©2015 [employer]. This information is not intended to replace advice given to you by your health care provider. Make sure you discuss any questions you have with your health care provider. ° °Contusion °A contusion is a deep bruise. Contusions are the result of an injury that caused bleeding under the skin. The contusion may turn blue, purple, or yellow. Minor injuries will give you a painless contusion, but more severe contusions may stay painful and swollen for a few weeks.  °CAUSES  °A contusion is usually caused by a blow, trauma, or direct force to an area of the body. °SYMPTOMS  °· Swelling and redness of the injured area. °· Bruising of the injured area. °· Tenderness and soreness of the injured area. °· Pain. °DIAGNOSIS  °The diagnosis can be made by taking a history and physical exam. An X-ray, CT scan, or MRI may be needed to determine if there were any associated injuries, such as fractures. °TREATMENT  °Specific treatment will depend on what area of the body was injured. In general, the best treatment for a contusion is resting, icing, elevating,   and applying cold compresses to the injured area. Over-the-counter medicines may also be recommended for pain control. Ask your caregiver what the best treatment is for your contusion. °HOME CARE INSTRUCTIONS  °· Put ice on the injured area. °¨ Put ice in a plastic bag. °¨ Place a towel between your skin and the bag. °¨ Leave the ice on for 15-20  minutes, 3-4 times a day, or as directed by your health care provider. °· Only take over-the-counter or prescription medicines for pain, discomfort, or fever as directed by your caregiver. Your caregiver may recommend avoiding anti-inflammatory medicines (aspirin, ibuprofen, and naproxen) for 48 hours because these medicines may increase bruising. °· Rest the injured area. °· If possible, elevate the injured area to reduce swelling. °SEEK IMMEDIATE MEDICAL CARE IF:  °· You have increased bruising or swelling. °· You have pain that is getting worse. °· Your swelling or pain is not relieved with medicines. °MAKE SURE YOU:  °· Understand these instructions. °· Will watch your condition. °· Will get help right away if you are not doing well or get worse. °Document Released: 06/12/2005 Document Revised: 09/07/2013 Document Reviewed: 07/08/2011 °ExitCare® Patient Information ©2015 [employer]. This information is not intended to replace advice given to you by your health care provider. Make sure you discuss any questions you have with your health care provider. ° °

## 2015-05-22 NOTE — ED Notes (Signed)
Pt states he was drilling a hole in a board and on two separate occasions the drill locked up and came back and hit him first on the medial side of his right knee and then on the lateral side of his left forearm.  Pt complains of pain underneath his kneecap, and down the medial side of his calf and on the lateral side of his forearm.  Pt has swelling in his left hand and 2nd and 3rd finders.  He denies injury to his hand.

## 2015-12-08 ENCOUNTER — Other Ambulatory Visit: Payer: Self-pay | Admitting: Internal Medicine

## 2016-02-01 ENCOUNTER — Telehealth: Payer: Self-pay | Admitting: Internal Medicine

## 2016-02-01 NOTE — Telephone Encounter (Signed)
Called to schedule appt - mailbox not set up

## 2016-02-01 NOTE — Telephone Encounter (Signed)
Fine with me

## 2016-02-01 NOTE — Telephone Encounter (Signed)
wife called in - pt would like to change providers from Dr Ronnald Ramp to [facility]mith due to [facility] being closer to the house.  Please let me know if this is ok with you.  Thank you

## 2016-02-01 NOTE — Telephone Encounter (Signed)
Yes, that is ok with me 

## 2016-04-16 ENCOUNTER — Encounter: Payer: Self-pay | Admitting: Internal Medicine

## 2016-04-16 ENCOUNTER — Ambulatory Visit (INDEPENDENT_AMBULATORY_CARE_PROVIDER_SITE_OTHER): Payer: Federal, State, Local not specified - PPO | Admitting: Internal Medicine

## 2016-04-16 VITALS — BP 130/80 | HR 76 | Temp 97.9°F | Ht 76.5 in | Wt 144.5 lb

## 2016-04-16 DIAGNOSIS — I712 Thoracic aortic aneurysm, without rupture, unspecified: Secondary | ICD-10-CM

## 2016-04-16 DIAGNOSIS — R0982 Postnasal drip: Secondary | ICD-10-CM | POA: Diagnosis not present

## 2016-04-16 DIAGNOSIS — Q8740 Marfan's syndrome, unspecified: Secondary | ICD-10-CM | POA: Diagnosis not present

## 2016-04-16 DIAGNOSIS — H60392 Other infective otitis externa, left ear: Secondary | ICD-10-CM

## 2016-04-16 DIAGNOSIS — R1011 Right upper quadrant pain: Secondary | ICD-10-CM

## 2016-04-16 DIAGNOSIS — M79644 Pain in right finger(s): Secondary | ICD-10-CM

## 2016-04-16 DIAGNOSIS — Z720 Tobacco use: Secondary | ICD-10-CM

## 2016-04-16 DIAGNOSIS — F17200 Nicotine dependence, unspecified, uncomplicated: Secondary | ICD-10-CM

## 2016-04-16 MED ORDER — FLUTICASONE PROPIONATE 50 MCG/ACT NA SUSP: 2.0000 | Freq: Every day | NASAL | 6 refills | Status: DC

## 2016-04-16 MED ORDER — CIPROFLOXACIN-DEXAMETHASONE 0.3-0.1 % OT SUSP: 4.0000 [drp] | Freq: Two times a day (BID) | OTIC | 0 refills | Status: DC

## 2016-04-16 MED ORDER — CETIRIZINE HCL 10 MG PO TABS: 10.0000 mg | ORAL_TABLET | Freq: Every day | ORAL | Status: DC

## 2016-04-16 NOTE — Assessment & Plan Note (Signed)
Encouraged him to drink boost supplement to gain weight He does not have a vascular doctor

## 2016-04-16 NOTE — Patient Instructions (Signed)
Food Choices for Gastroesophageal Reflux Disease, Adult When you have gastroesophageal reflux disease (GERD), the foods you eat and your eating habits are very important. Choosing the right foods can help ease the discomfort of GERD. WHAT GENERAL GUIDELINES DO I NEED TO FOLLOW?  Choose fruits, vegetables, whole grains, low-fat dairy products, and low-fat meat, fish, and poultry.  Limit fats such as oils, salad dressings, butter, nuts, and avocado.  Keep a food diary to identify foods that cause symptoms.  Avoid foods that cause reflux. These may be different for different people.  Eat frequent small meals instead of three large meals each day.  Eat your meals slowly, in a relaxed setting.  Limit fried foods.  Cook foods using methods other than frying.  Avoid drinking alcohol.  Avoid drinking large amounts of liquids with your meals.  Avoid bending over or lying down until 2-3 hours after eating. WHAT FOODS ARE NOT RECOMMENDED? The following are some foods and drinks that may worsen your symptoms: Vegetables Tomatoes. Tomato juice. Tomato and spaghetti sauce. Chili peppers. Onion and garlic. Horseradish. Fruits Oranges, grapefruit, and lemon (fruit and juice). Meats High-fat meats, fish, and poultry. This includes hot dogs, ribs, ham, sausage, salami, and bacon. Dairy Whole milk and chocolate milk. Sour cream. Cream. Butter. Ice cream. Cream cheese.  Beverages Coffee and tea, with or without caffeine. Carbonated beverages or energy drinks. Condiments Hot sauce. Barbecue sauce.  Sweets/Desserts Chocolate and cocoa. Donuts. Peppermint and spearmint. Fats and Oils High-fat foods, including French fries and potato chips. Other Vinegar. Strong spices, such as black pepper, white pepper, red pepper, cayenne, curry powder, cloves, ginger, and chili powder. The items listed above may not be a complete list of foods and beverages to avoid. Contact your dietitian for more  information.   This information is not intended to replace advice given to you by your health care provider. Make sure you discuss any questions you have with your health care provider.   Document Released: 09/02/2005 Document Revised: 09/23/2014 Document Reviewed: 07/07/2013 Elsevier Interactive Patient Education 2016 Elsevier Inc.  

## 2016-04-16 NOTE — Progress Notes (Signed)
HPI  Pt presents to the clinic today to establish care and for management of the conditions listed below. He is transferring care from Dr. Ronnald Ramp.  Marfan's Syndrome: He is underweight- BMI of 17. He reports he has lost about 6 lbs over the last few months. He is not sure why.  History of Thoracic Aortic Aneurysm: s/p repair with prosthetic valve. He has not seen his cardiologist in the last year.  Smoker: He has been smoking for 20 years. He has tried to quit with Chantix in the past but was unsuccessful.  Allergic Rhinitis: He reports he can not breathe out of the left side of his nose. He was prescribed Nasonex and reports it did not work. He would like a RX for Flonase today.  He also c/o left ear drainage and swelling. This started 3 weeks ago. The drainage was yellow. He feels like his left ear is swollen shut. He has had trouble hearing out of that ear. He has not tried anything OTC for this.  He also c/o of a sharp pain in his abdomen only when he drinks water. He can drink tea, lemonade and soda and do just fine. He reports the pain is sharp, stabbing and burning. He has seen a GI in the past. Upper endoscopy was positive for H Pylori, he was treated but he still has pain when he drinks water. He denies difficulty swallowing. He denies reflux. His bowels are moving normally.  He is also concerned about his right pinky finger. He reports he jammed it around [place]. He never went to have it looked at but now c/o sharp pain when he bends the finger. He feels like the joint is deformed. He has not taken anything OTC for this.  He also reports that when he wears a watch that has batteries, the battery will die within a 3 hour time period. He feels like his body is absorbing the energy from the batteries. He is not sure if this is related or not to his artificial heart valve. He has stopped wearing watches because of this. He would like to know what is causing it.  Past Medical History:   Diagnosis Date  . Marfan syndrome   . Stomach problems   . Thoracic aortic aneurysm (HCC)    replacement by Dr. Valentina Lucks /30/07 with aortic bioprosthetic valve  . Tobacco abuse      Current Outpatient Prescriptions  Medication Sig Dispense Refill  . ciprofloxacin-dexamethasone (CIPRODEX) otic suspension Place 4 drops into the left ear 2 (two) times daily. 7.5 mL 0  . fluticasone (FLONASE) 50 MCG/ACT nasal spray Place 2 sprays into both nostrils daily. 16 g 6   Current Facility-Administered Medications  Medication Dose Route Frequency Provider Last Rate Last Dose  . cetirizine (ZYRTEC) tablet 10 mg  10 mg Oral Daily Jearld Fenton, NP        Allergies  Allergen Reactions  . Augmentin [Amoxicillin-Pot Clavulanate] Shortness Of Breath and Nausea And Vomiting    Family History  Problem Relation Age of Onset  . Asthma Mother   . Early death Father   . Heart disease Father   . Asthma Brother   . Diabetes Maternal Grandmother   . Cancer Neg Hx   . Stroke Neg Hx   . Kidney disease Neg Hx   . Hypertension Neg Hx   . Hyperlipidemia Neg Hx     Social History   Social History  . Marital status: Married  Spouse name: N/A  . Number of children: N/A  . Years of education: N/A   Occupational History  . Not on file.   Social History Main Topics  . Smoking status: Current Every Day Smoker    Packs/day: 1.00    Years: 20.00  . Smokeless tobacco: Never Used  . Alcohol use 0.6 oz/week    1 Shots of liquor per week     Comment: rare  . Drug use: No  . Sexual activity: Yes   Other Topics Concern  . Not on file   Social History Narrative  . No narrative on file    ROS:  Constitutional: Pt reports weight loss. Denies fever, malaise, fatigue, headache.  HEENT: Pt reports nasal congestion, ear pain and post nasal drip. Denies eye pain, eye redness, ringing in the ears, wax buildup, runny nose, bloody nose, or sore throat. Respiratory: Denies difficulty breathing,  shortness of breath, cough or sputum production.   Cardiovascular: Denies chest pain, chest tightness, palpitations or swelling in the hands or feet.  Gastrointestinal: Pt reports right upper quadrant abdominal pain. Denies bloating, constipation, diarrhea or blood in the stool.  GU: Denies frequency, urgency, pain with urination, blood in urine, odor or discharge. Musculoskeletal: Pt reports pain in right pinky finger. Denies decrease in range of motion, difficulty with gait, muscle pain or joint swelling.  Skin: Denies redness, rashes, lesions or ulcercations.  Neurological: Denies dizziness, difficulty with memory, difficulty with speech or problems with balance and coordination.  Psych: Denies anxiety, depression, SI/HI.  No other specific complaints in a complete review of systems (except as listed in HPI above).  PE:  BP 130/80 (BP Location: Right Arm, Patient Position: Sitting, Cuff Size: Normal)   Pulse 76   Temp 97.9 F (36.6 C) (Oral)   Ht 6' 4.5" (1.943 m)   Wt 144 lb 8 oz (65.5 kg)   BMI 17.36 kg/m  Wt Readings from Last 3 Encounters:  04/16/16 144 lb 8 oz (65.5 kg)  10/12/14 162 lb (73.5 kg)  08/17/14 163 lb 12.8 oz (74.3 kg)    General: Appears his stated age, gangly, underweight, in NAD. HEENT: Head: normal shape and size; Eyes: sclera white, no icterus, conjunctiva pink; Left Ears: Canal erythematous with pus noted;Throat/Mouth: Teeth present, mucosa pink and moist, no lesions or ulcerations noted. + PND. Neck: No adenopathy noted. Cardiovascular: Normal rate and rhythm. S1,S2 noted. Click noted. Pulmonary/Chest: Normal effort and positive vesicular breath sounds. No respiratory distress. No wheezes, rales or ronchi noted.  Abdomen: Soft and nontender. Normal bowel sounds. Musculoskeletal: Right pinky PIP enlarged. Difficulty with flexion. No joint swelling. Hand grips equal.  Neurological: Alert and oriented.    BMET    Component Value Date/Time   NA 141  08/05/2014 1510   K 4.1 08/05/2014 1510   CL 105 08/05/2014 1510   CO2 24 08/05/2014 1510   GLUCOSE 89 08/05/2014 1510   BUN 17 08/05/2014 1510   CREATININE 1.21 08/05/2014 1510   CALCIUM 9.5 08/05/2014 1510   GFRNONAA 72 (L) 08/05/2014 1510   GFRAA 84 (L) 08/05/2014 1510    Lipid Panel  No results found for: CHOL, TRIG, HDL, CHOLHDL, VLDL, LDLCALC  CBC    Component Value Date/Time   WBC 8.2 08/05/2014 1510   RBC 4.00 (L) 08/05/2014 1510   HGB 12.7 (L) 08/05/2014 1510   HCT 37.8 (L) 08/05/2014 1510   PLT 204 08/05/2014 1510   MCV 94.5 08/05/2014 1510   MCH 31.8 08/05/2014  1510   MCHC 33.6 08/05/2014 1510   RDW 12.9 08/05/2014 1510   LYMPHSABS 2.3 01/01/2014 2000   MONOABS 0.6 01/01/2014 2000   EOSABS 0.4 01/01/2014 2000   BASOSABS 0.1 01/01/2014 2000    Hgb A1C No results found for: HGBA1C   Assessment and Plan:  Left Otitis Externa:  eRx for Ciprodex BIC x 7 days Laying on a hot water bladder can help  Pain in right pinky finger:  Likely fracture that didn't heal properly He declines xray today  Allergic Rhinitis:  eRx for Flonase daily in am  Abdominal pain:   Not sure what would cause this Advised him to try Prilosec OTC If no improvement, follow bak up with GI  I honestly have no clue what to do about his body "draining the watch battery". ? If it is a faulty watch.  Make an appt for your annual exam Naidelin Gugliotta, NP  Zyrtec once daily in pm

## 2016-04-16 NOTE — Assessment & Plan Note (Signed)
Encouraged him to follow up with his cardiologist

## 2016-04-16 NOTE — Assessment & Plan Note (Signed)
Encouraged him to quit He is not interested in that at this time

## 2016-09-18 DIAGNOSIS — K080 Exfoliation of teeth due to systemic causes: Secondary | ICD-10-CM | POA: Diagnosis not present

## 2017-04-29 ENCOUNTER — Telehealth: Payer: Self-pay | Admitting: Cardiovascular Disease

## 2017-04-29 MED ORDER — CLINDAMYCIN HCL 150 MG PO CAPS: ORAL_CAPSULE | ORAL | 0 refills | Status: DC

## 2017-04-29 NOTE — Telephone Encounter (Signed)
New message    Pt is at the office to have teeth cleaned, they need to know if pt needs to be pre-med.

## 2017-04-29 NOTE — Telephone Encounter (Signed)
Spoke with jessica-Dr Lane's office-dentist(pt is there right now) she states that pt has had a valve replacement >82yrs ago not seen by Dr Gwenlyn Found since 2015. Pt is allergic to Augmentin. Please advise if prophylaxis is needed.

## 2017-04-29 NOTE — Telephone Encounter (Signed)
Sent to pharmacy as ordered, tried to notify pt no vm set up

## 2017-04-29 NOTE — Telephone Encounter (Signed)
Sharyn Lull Can you call on this?  thx

## 2017-04-29 NOTE — Telephone Encounter (Signed)
Clindamycin 150 mg capsule = take 4 capsules (600 mg) 1 hour prior to appt

## 2017-04-29 NOTE — Telephone Encounter (Signed)
Given the fact the patient has a prosthetic aortic valve he does need antibiotic prophylaxis for dental work. Ask Christopher Ball for an appropriate alternative for an augmented allergic patient.

## 2017-04-30 DIAGNOSIS — K080 Exfoliation of teeth due to systemic causes: Secondary | ICD-10-CM | POA: Diagnosis not present

## 2017-04-30 NOTE — Telephone Encounter (Signed)
Pt notified of new rx  Pt wanted to re-establish appt made for 10-3

## 2017-05-17 ENCOUNTER — Other Ambulatory Visit: Payer: Self-pay | Admitting: Internal Medicine

## 2017-06-18 ENCOUNTER — Ambulatory Visit
Admission: RE | Admit: 2017-06-18 | Discharge: 2017-06-18 | Disposition: A | Discharge status: Home / Self Care | Payer: Federal, State, Local not specified - PPO | Source: Ambulatory Visit | Attending: Cardiovascular Disease | Admitting: Cardiovascular Disease

## 2017-06-18 ENCOUNTER — Ambulatory Visit (INDEPENDENT_AMBULATORY_CARE_PROVIDER_SITE_OTHER): Payer: Federal, State, Local not specified - PPO | Admitting: Cardiovascular Disease

## 2017-06-18 ENCOUNTER — Encounter: Payer: Self-pay | Admitting: Cardiovascular Disease

## 2017-06-18 ENCOUNTER — Encounter (INDEPENDENT_AMBULATORY_CARE_PROVIDER_SITE_OTHER): Payer: Self-pay

## 2017-06-18 VITALS — BP 118/70 | HR 73 | Ht 78.0 in | Wt 147.0 lb

## 2017-06-18 DIAGNOSIS — I712 Thoracic aortic aneurysm, without rupture, unspecified: Secondary | ICD-10-CM

## 2017-06-18 DIAGNOSIS — Z952 Presence of prosthetic heart valve: Secondary | ICD-10-CM | POA: Diagnosis not present

## 2017-06-18 DIAGNOSIS — R634 Abnormal weight loss: Secondary | ICD-10-CM | POA: Diagnosis not present

## 2017-06-18 NOTE — Assessment & Plan Note (Signed)
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 Servando Snare on 01/13/06 bioprosthetic aortic valve replacement and coronary artery reimplantation. He did have normal coronary arteries. He denies chest pain but is chronically short of breath probably from COPD. I will recheck a 2-D echocardiogram.

## 2017-06-18 NOTE — Progress Notes (Signed)
06/18/2017 Christopher Ball   [birth year]  [id number]  Primary Physician Sampson Si Salvadore Oxford, NP Primary Cardiologist: [name] MD Milagros Loll, [place], [place]  HPI:  Christopher Ball is a 46 y.o. tall and thin appearing married Caucasian male father of 2 children who does not work. He is accompanied by his wife today. I last saw him in the office 08/17/14.Christopher Ball He has a history of Marfan syndrome and underwent cardiac catheterization by myself 12/04/05 revealing normal coronary arteries and normal LV function. He ultimately underwent thoracic aortic aneurysm resection and grafting on 01/13/06 by Dr. [name] with aortic valve replacement using a bioprosthesis and coronary artery reimplantation. As of the problems include continued tobacco abuse 1 pack per day. He did have a chest CTA performed in April of this year that was negative for dissection. He recently complained of chest pain which is musculoskeletal and was evaluated in the emergency room 08/05/14 without a diagnosis. His pain has since resolved. Since I saw him 3 years ago he has lost 15 pounds. He is chronically short of breath most likely from COPD with continued tobacco abuse of one half packs per day.   Current Meds  Medication Sig  . clindamycin (CLEOCIN) 150 MG capsule Clindamycin 150 mg capsule = take 4 capsules (600 mg) 1 hour prior to dental procedure  . fluticasone (FLONASE) 50 MCG/ACT nasal spray Place 2 sprays into both nostrils daily. MUST SCHEDULE ANNUAL EXAM  . [DISCONTINUED] ciprofloxacin-dexamethasone (CIPRODEX) otic suspension Place 4 drops into the left ear 2 (two) times daily.   Current Facility-Administered Medications for the 06/18/17 encounter (Office Visit) with [name], MD  Medication  . cetirizine (ZYRTEC) tablet 10 mg     Allergies  Allergen Reactions  . Augmentin [Amoxicillin-Pot Clavulanate] Shortness Of Breath and Nausea And Vomiting    Social History   Social History  . Marital status:  Married    Spouse name: N/A  . Number of children: N/A  . Years of education: N/A   Occupational History  . Not on file.   Social History Main Topics  . Smoking status: Current Every Day Smoker    Packs/day: 1.00    Years: 20.00  . Smokeless tobacco: Never Used  . Alcohol use 0.6 oz/week    1 Shots of liquor per week     Comment: rare  . Drug use: No  . Sexual activity: Yes   Other Topics Concern  . Not on file   Social History Narrative  . No narrative on file     Review of Systems: General: negative for chills, fever, night sweats or weight changes.  Cardiovascular: negative for chest pain, dyspnea on exertion, edema, orthopnea, palpitations, paroxysmal nocturnal dyspnea or shortness of breath Dermatological: negative for rash Respiratory: negative for cough or wheezing Urologic: negative for hematuria Abdominal: negative for nausea, vomiting, diarrhea, bright red blood per rectum, melena, or hematemesis Neurologic: negative for visual changes, syncope, or dizziness All other systems reviewed and are otherwise negative except as noted above.    Blood pressure 118/70, pulse 73, height 6\' 6"  (1.981 m), weight 147 lb (66.7 kg).  General appearance: alert and no distress Neck: no adenopathy, no carotid bruit, no JVD, supple, symmetrical, trachea midline and thyroid not enlarged, symmetric, no tenderness/mass/nodules Lungs: clear to auscultation bilaterally Heart: regular rate and rhythm, S1, S2 normal, no murmur, click, rub or gallop Extremities: extremities normal, atraumatic, no cyanosis or edema Pulses: 2+ and symmetric Skin: Skin color,  texture, turgor normal. No rashes or lesions Neurologic: Alert and oriented X 3, normal strength and tone. Normal symmetric reflexes. Normal coordination and gait  EKG normal sinus rhythm at 73 with left ventricle hypertrophy. I personally reviewed this EKG.  ASSESSMENT AND PLAN:   Thoracic aortic aneurysm History of Marfan's  syndrome syndrome status post thoracic aortic aneurysm resection and grafting by Dr. [name] on 01/13/06 bioprosthetic aortic valve replacement and coronary artery reimplantation. He did have normal coronary arteries. He denies chest pain but is chronically short of breath probably from COPD. I will recheck a 2-D echocardiogram.  Smoker History of continued tobacco abuse of one half packs a day recalcitrant to risk factor modification  Unexplained weight loss Christopher Ball has always been told then although he's lost 15 pounds over the last year or 2 for unclear reasons. Obvious concerns are potential malignancy. I'm going to get some routine blood work on him as well as a chest x-ray. I will refer him back to his primary care physician for a malignancy workup.      [name] MD FACP,FACC,FAHA, [facility] 06/18/2017 12:10 PM

## 2017-06-18 NOTE — Assessment & Plan Note (Addendum)
History of continued tobacco abuse of one half packs a day recalcitrant to risk factor modification

## 2017-06-18 NOTE — Assessment & Plan Note (Signed)
Christopher Ball has always been told then although he's lost 15 pounds over the last year or 2 for unclear reasons. Obvious concerns are potential malignancy. I'm going to get some routine blood work on him as well as a chest x-ray. I will refer him back to his primary care physician for a malignancy workup.

## 2017-06-18 NOTE — Patient Instructions (Signed)
Medication Instructions: Your physician recommends that you continue on your current medications as directed. Please refer to the Current Medication list given to you today.  Labwork: Your physician recommends that you return for lab work. It will be fasting--nothing to eat or drink after midnight the night before.  Testing/Procedures: Your physician has requested that you have an echocardiogram. Echocardiography is a painless test that uses sound waves to create images of your heart. It provides your doctor with information about the size and shape of your heart and how well your heart's chambers and valves are working. This procedure takes approximately one hour. There are no restrictions for this procedure.  Follow-Up: Your physician wants you to follow-up in: 1 year with Dr. Gwenlyn Found unless abnormal results. You will receive a reminder letter in the mail two months in advance. If you don't receive a letter, please call our office to schedule the follow-up appointment.  If you need a refill on your cardiac medications before your next appointment, please call your pharmacy.

## 2017-06-23 ENCOUNTER — Ambulatory Visit (HOSPITAL_COMMUNITY): Payer: Federal, State, Local not specified - PPO | Attending: Internal Medicine

## 2017-06-23 ENCOUNTER — Other Ambulatory Visit: Payer: Self-pay

## 2017-06-23 DIAGNOSIS — I712 Thoracic aortic aneurysm, without rupture, unspecified: Secondary | ICD-10-CM

## 2017-06-23 DIAGNOSIS — Z952 Presence of prosthetic heart valve: Secondary | ICD-10-CM | POA: Diagnosis not present

## 2017-06-23 DIAGNOSIS — J449 Chronic obstructive pulmonary disease, unspecified: Secondary | ICD-10-CM | POA: Diagnosis not present

## 2017-06-23 DIAGNOSIS — Q8740 Marfan's syndrome, unspecified: Secondary | ICD-10-CM | POA: Insufficient documentation

## 2017-06-23 DIAGNOSIS — I340 Nonrheumatic mitral (valve) insufficiency: Secondary | ICD-10-CM | POA: Insufficient documentation

## 2017-06-24 LAB — CBC
HEMATOCRIT: 41.6 % (ref 37.5–51.0)
HEMOGLOBIN: 14.4 g/dL (ref 13.0–17.7)
MCH: 32.7 pg (ref 26.6–33.0)
MCHC: 34.6 g/dL (ref 31.5–35.7)
MCV: 94 fL (ref 79–97)
Platelets: 220 10*3/uL (ref 150–379)
RBC: 4.41 x10E6/uL (ref 4.14–5.80)
RDW: 12.9 % (ref 12.3–15.4)
WBC: 8.3 10*3/uL (ref 3.4–10.8)

## 2017-06-24 LAB — HEPATIC FUNCTION PANEL
ALT: 22 IU/L (ref 0–44)
AST: 24 IU/L (ref 0–40)
Albumin: 4.5 g/dL (ref 3.5–5.5)
Alkaline Phosphatase: 72 IU/L (ref 39–117)
BILIRUBIN TOTAL: 0.2 mg/dL (ref 0.0–1.2)
BILIRUBIN, DIRECT: 0.05 mg/dL (ref 0.00–0.40)
TOTAL PROTEIN: 7 g/dL (ref 6.0–8.5)

## 2017-06-24 LAB — PSA: Prostate Specific Ag, Serum: 0.6 ng/mL (ref 0.0–4.0)

## 2017-06-24 LAB — LIPID PANEL
CHOLESTEROL TOTAL: 173 mg/dL (ref 100–199)
Chol/HDL Ratio: 3.6 ratio (ref 0.0–5.0)
HDL: 48 mg/dL (ref >39)
LDL CALC: 111 mg/dL — AB (ref 0–99)
Triglycerides: 69 mg/dL (ref 0–149)
VLDL Cholesterol Cal: 14 mg/dL (ref 5–40)

## 2017-06-24 LAB — BASIC METABOLIC PANEL
BUN / CREAT RATIO: 16 (ref 9–20)
BUN: 20 mg/dL (ref 6–24)
CO2: 23 mmol/L (ref 20–29)
CREATININE: 1.27 mg/dL (ref 0.76–1.27)
Calcium: 9.4 mg/dL (ref 8.7–10.2)
Chloride: 107 mmol/L — ABNORMAL HIGH (ref 96–106)
GFR calc non Af Amer: 68 mL/min/{1.73_m2} (ref >59)
GFR, EST AFRICAN AMERICAN: 78 mL/min/{1.73_m2} (ref >59)
Glucose: 94 mg/dL (ref 65–99)
Potassium: 4.9 mmol/L (ref 3.5–5.2)
Sodium: 142 mmol/L (ref 134–144)

## 2017-06-24 LAB — T4, FREE: FREE T4: 1.12 ng/dL (ref 0.82–1.77)

## 2017-06-24 LAB — TSH: TSH: 2.04 u[IU]/mL (ref 0.450–4.500)

## 2017-06-24 LAB — HEMOGLOBIN A1C
Est. average glucose Bld gHb Est-mCnc: 108 mg/dL
Hgb A1c MFr Bld: 5.4 % (ref 4.8–5.6)

## 2017-06-27 ENCOUNTER — Ambulatory Visit (INDEPENDENT_AMBULATORY_CARE_PROVIDER_SITE_OTHER): Payer: Federal, State, Local not specified - PPO | Admitting: Family Medicine

## 2017-06-27 ENCOUNTER — Encounter: Payer: Self-pay | Admitting: Family Medicine

## 2017-06-27 DIAGNOSIS — K4090 Unilateral inguinal hernia, without obstruction or gangrene, not specified as recurrent: Secondary | ICD-10-CM | POA: Diagnosis not present

## 2017-06-27 DIAGNOSIS — N50812 Left testicular pain: Secondary | ICD-10-CM | POA: Diagnosis not present

## 2017-06-27 NOTE — Patient Instructions (Signed)
Please stop at the front desk to set up referral.  

## 2017-06-27 NOTE — Assessment & Plan Note (Signed)
Chronic pain.. Not clearly related to hernia on right.

## 2017-06-27 NOTE — Assessment & Plan Note (Signed)
Doubt related to more chronic pain in left testcile. Refer to surgeon for likely surgical repair.

## 2017-06-27 NOTE — Progress Notes (Signed)
Subjective:    Patient ID: Christopher Ball, male    DOB: 11-08-1970, 46 y.o.   MRN: [id number]  HPI    46 year old male patient of Nicki Reaper presents with new onset lump in groin.   He reports  Lump in right groin into  Right testicle. Present in last 3 day, nonpainful.  No urinary issues. Feels heaviness and pulling in right groin and lower abdomen. No fever.  Also in last several years after vasectomy, he has had soreness in left testicular sac above testicle.  Sore to palpation and enlarged Hx marfan syndrome. H of pneumothorax and TAA repeair.   Review of Systems  Constitutional: Positive for fatigue. Negative for fever.  HENT: Negative for ear pain.   Eyes: Negative for pain.  Respiratory: Negative for cough and shortness of breath.   Cardiovascular: Negative for chest pain, palpitations and leg swelling.  Gastrointestinal: Negative for abdominal pain.  Genitourinary: Negative for dysuria.  Musculoskeletal: Negative for arthralgias.  Neurological: Negative for syncope, light-headedness and headaches.  Psychiatric/Behavioral: Negative for dysphoric mood.  All other systems reviewed and are negative.      Objective:   Physical Exam  Constitutional: Vital signs are normal. He appears well-developed and well-nourished.  HENT:  Head: Normocephalic.  Right Ear: Hearing normal.  Left Ear: Hearing normal.  Nose: Nose normal.  Mouth/Throat: Oropharynx is clear and moist and mucous membranes are normal.  Neck: Trachea normal. Carotid bruit is not present. No thyroid mass and no thyromegaly present.  Cardiovascular: Normal rate, regular rhythm and normal pulses.  Exam reveals no gallop, no distant heart sounds and no friction rub.   No murmur heard. No peripheral edema  Pulmonary/Chest: Effort normal and breath sounds normal. No respiratory distress.  Abdominal: A hernia is present. Hernia confirmed positive in the right inguinal area. Hernia confirmed negative in  the left inguinal area.  Genitourinary:    Right testis shows no mass, no swelling and no tenderness. Right testis is descended. Cremasteric reflex is not absent on the right side. Left testis shows mass and tenderness.  Genitourinary Comments: Area of swelling  Skin: Skin is warm, dry and intact. No rash noted.  Psychiatric: He has a normal mood and affect. His speech is normal and behavior is normal. Thought content normal.          Assessment & Plan:

## 2017-06-27 NOTE — Addendum Note (Signed)
Addended by: Carter Kitten on: 06/27/2017 09:45 AM   Modules accepted: Orders

## 2017-07-01 ENCOUNTER — Ambulatory Visit
Admission: RE | Admit: 2017-07-01 | Discharge: 2017-07-01 | Disposition: A | Discharge status: Home / Self Care | Payer: Federal, State, Local not specified - PPO | Source: Ambulatory Visit | Attending: Family Medicine | Admitting: Family Medicine

## 2017-07-01 DIAGNOSIS — N50812 Left testicular pain: Secondary | ICD-10-CM | POA: Diagnosis not present

## 2017-07-01 DIAGNOSIS — N503 Cyst of epididymis: Secondary | ICD-10-CM | POA: Diagnosis not present

## 2017-07-01 DIAGNOSIS — K4090 Unilateral inguinal hernia, without obstruction or gangrene, not specified as recurrent: Secondary | ICD-10-CM | POA: Insufficient documentation

## 2017-07-01 DIAGNOSIS — N5082 Scrotal pain: Secondary | ICD-10-CM | POA: Diagnosis not present

## 2017-07-02 DIAGNOSIS — K4090 Unilateral inguinal hernia, without obstruction or gangrene, not specified as recurrent: Secondary | ICD-10-CM | POA: Diagnosis not present

## 2018-06-24 ENCOUNTER — Encounter: Payer: Self-pay | Admitting: Cardiovascular Disease

## 2018-06-24 ENCOUNTER — Ambulatory Visit: Payer: Federal, State, Local not specified - PPO | Admitting: Cardiovascular Disease

## 2018-06-24 VITALS — BP 122/90 | HR 65 | Ht 78.0 in | Wt 153.8 lb

## 2018-06-24 DIAGNOSIS — I712 Thoracic aortic aneurysm, without rupture, unspecified: Secondary | ICD-10-CM

## 2018-06-24 DIAGNOSIS — Z952 Presence of prosthetic heart valve: Secondary | ICD-10-CM

## 2018-06-24 DIAGNOSIS — F17200 Nicotine dependence, unspecified, uncomplicated: Secondary | ICD-10-CM | POA: Diagnosis not present

## 2018-06-24 NOTE — Addendum Note (Signed)
Addended by: Jacqulynn Cadet on: 06/24/2018 11:45 AM   Modules accepted: Orders

## 2018-06-24 NOTE — Patient Instructions (Signed)
Medication Instructions:  Your physician recommends that you continue on your current medications as directed. Please refer to the Current Medication list given to you today.  If you need a refill on your cardiac medications before your next appointment, please call your pharmacy.   Lab work: Your physician recommends that you return for lab work in: FASTING labs  If you have labs (blood work) drawn today and your tests are completely normal, you will receive your results only by: Marland Kitchen MyChart Message (if you have MyChart) OR . A paper copy in the mail If you have any lab test that is abnormal or we need to change your treatment, we will call you to review the results.  Testing/Procedures: Your physician has requested that you have an echocardiogram. Echocardiography is a painless test that uses sound waves to create images of your heart. It provides your doctor with information about the size and shape of your heart and how well your heart's chambers and valves are working. This procedure takes approximately one hour. There are no restrictions for this procedure.    Follow-Up: At [facility], you and your health needs are our priority.  As part of our continuing mission to provide you with exceptional heart care, we have created designated Provider Care Teams.  These Care Teams include your primary Cardiologist (physician) and Advanced Practice Providers (APPs -  Physician Assistants and Nurse Practitioners) who all work together to provide you with the care you need, when you need it. You will need a follow up appointment in 12 months.  Please call our office 2 months in advance to schedule this appointment.  You may see Quay Burow, MD or one of the following Advanced Practice Providers on your designated Care Team:   Kerin Ransom, PA-C Roby Lofts, [place] . Sande Rives, PA-C  Any Other Special Instructions Will Be Listed Below (If Applicable).

## 2018-06-24 NOTE — Assessment & Plan Note (Signed)
History of ongoing tobacco abuse 1 pack/day recalcitrant risk factor modification. 

## 2018-06-24 NOTE — Assessment & Plan Note (Signed)
History of thoracic aortic aneurysm secondary to Marfan's disease status post resection and grafting by Dr. Servando Snare 01/13/2006 with aortic valve replacement using a bioprosthesis and coronary artery reimplantation.  His last 2D echo performed a year ago was essentially normal.  He denies chest pain or shortness of breath.

## 2018-06-24 NOTE — Progress Notes (Signed)
06/24/2018 Christopher Ball   [birth year]  [id number]  Primary Physician Sampson Si Salvadore Oxford, NP Primary Cardiologist: [name] MD Milagros Loll, [place], [place]  HPI:  Christopher Ball is a 47 y.o.   tall and thin appearing married Caucasian male father of 2 children who does not work. He is accompanied by his wife today. I last saw him in the office 06/18/2017.Marland Kitchen He has a history of Marfan syndrome and underwent cardiac catheterization by myself 12/04/05 revealing normal coronary arteries and normal LV function. He ultimately underwent thoracic aortic aneurysm resection and grafting on 01/13/06 by Dr. [name] with aortic valve replacement using a bioprosthesis and coronary artery reimplantation. As of the problems include continued tobacco abuse 1 pack per day.   Since I saw him a year ago he is remained stable.  He denies chest pain or shortness of breath.  He continues to smoke a pack a day.  His most recent 2D echo performed a year ago revealed normal LV systolic function with a normal functioning aortic bioprosthesis.     Current Meds  Medication Sig  . clindamycin (CLEOCIN) 150 MG capsule Clindamycin 150 mg capsule = take 4 capsules (600 mg) 1 hour prior to dental procedure  . fluticasone (FLONASE) 50 MCG/ACT nasal spray Place 2 sprays into both nostrils daily. MUST SCHEDULE ANNUAL EXAM   Current Facility-Administered Medications for the 06/24/18 encounter (Office Visit) with [name], MD  Medication  . cetirizine (ZYRTEC) tablet 10 mg     Allergies  Allergen Reactions  . Augmentin [Amoxicillin-Pot Clavulanate] Shortness Of Breath and Nausea And Vomiting    Social History   Socioeconomic History  . Marital status: Married    Spouse name: Not on file  . Number of children: Not on file  . Years of education: Not on file  . Highest education level: Not on file  Occupational History  . Not on file  Social Needs  . Financial resource strain: Not on file  . Food  insecurity:    Worry: Not on file    Inability: Not on file  . Transportation needs:    Medical: Not on file    Non-medical: Not on file  Tobacco Use  . Smoking status: Current Every Day Smoker    Packs/day: 1.00    Years: 20.00    Pack years: 20.00  . Smokeless tobacco: Never Used  Substance and Sexual Activity  . Alcohol use: Yes    Alcohol/week: 1.0 standard drinks    Types: 1 Shots of liquor per week    Comment: rare  . Drug use: No  . Sexual activity: Yes  Lifestyle  . Physical activity:    Days per week: Not on file    Minutes per session: Not on file  . Stress: Not on file  Relationships  . Social connections:    Talks on phone: Not on file    Gets together: Not on file    Attends religious service: Not on file    Active member of club or organization: Not on file    Attends meetings of clubs or organizations: Not on file    Relationship status: Not on file  . Intimate partner violence:    Fear of current or ex partner: Not on file    Emotionally abused: Not on file    Physically abused: Not on file    Forced sexual activity: Not on file  Other Topics Concern  . Not on file  Social History Narrative  . Not on file     Review of Systems: General: negative for chills, fever, night sweats or weight changes.  Cardiovascular: negative for chest pain, dyspnea on exertion, edema, orthopnea, palpitations, paroxysmal nocturnal dyspnea or shortness of breath Dermatological: negative for rash Respiratory: negative for cough or wheezing Urologic: negative for hematuria Abdominal: negative for nausea, vomiting, diarrhea, bright red blood per rectum, melena, or hematemesis Neurologic: negative for visual changes, syncope, or dizziness All other systems reviewed and are otherwise negative except as noted above.    Blood pressure 122/90, pulse 65, height 6\' 6"  (1.981 m), weight 153 lb 12.8 oz (69.8 kg).  General appearance: alert and no distress Neck: no adenopathy, no  carotid bruit, no JVD, supple, symmetrical, trachea midline and thyroid not enlarged, symmetric, no tenderness/mass/nodules Lungs: clear to auscultation bilaterally Heart: regular rate and rhythm, S1, S2 normal, no murmur, click, rub or gallop Extremities: extremities normal, atraumatic, no cyanosis or edema Pulses: 2+ and symmetric Skin: Skin color, texture, turgor normal. No rashes or lesions Neurologic: Alert and oriented X 3, normal strength and tone. Normal symmetric reflexes. Normal coordination and gait  EKG sinus rhythm at 65 with evidence of LVH with early repolarization changes.  I personally reviewed this EKG.  ASSESSMENT AND PLAN:   Thoracic aortic aneurysm History of thoracic aortic aneurysm secondary to Marfan's disease status post resection and grafting by Dr. [name] 01/13/2006 with aortic valve replacement using a bioprosthesis and coronary artery reimplantation.  His last 2D echo performed a year ago was essentially normal.  He denies chest pain or shortness of breath.  Smoker History of ongoing tobacco abuse 1 pack/day recalcitrant risk factor modification.      [name] MD FACP,FACC,FAHA, [facility] 06/24/2018 10:53 AM

## 2018-07-01 DIAGNOSIS — F17200 Nicotine dependence, unspecified, uncomplicated: Secondary | ICD-10-CM | POA: Diagnosis not present

## 2018-07-01 DIAGNOSIS — I712 Thoracic aortic aneurysm, without rupture: Secondary | ICD-10-CM | POA: Diagnosis not present

## 2018-07-01 LAB — HEPATIC FUNCTION PANEL
ALK PHOS: 80 IU/L (ref 39–117)
ALT: 20 IU/L (ref 0–44)
AST: 17 IU/L (ref 0–40)
Albumin: 4.2 g/dL (ref 3.5–5.5)
Bilirubin, Direct: 0.06 mg/dL (ref 0.00–0.40)
Total Protein: 6.6 g/dL (ref 6.0–8.5)

## 2018-07-01 LAB — LIPID PANEL
CHOLESTEROL TOTAL: 203 mg/dL — AB (ref 100–199)
Chol/HDL Ratio: 4.7 ratio (ref 0.0–5.0)
HDL: 43 mg/dL (ref >39)
LDL CALC: 145 mg/dL — AB (ref 0–99)
TRIGLYCERIDES: 74 mg/dL (ref 0–149)
VLDL Cholesterol Cal: 15 mg/dL (ref 5–40)

## 2018-07-02 ENCOUNTER — Other Ambulatory Visit: Payer: Self-pay

## 2018-07-02 ENCOUNTER — Ambulatory Visit (HOSPITAL_COMMUNITY): Payer: Federal, State, Local not specified - PPO | Attending: Cardiovascular Disease

## 2018-07-02 DIAGNOSIS — F17200 Nicotine dependence, unspecified, uncomplicated: Secondary | ICD-10-CM

## 2018-07-02 DIAGNOSIS — I712 Thoracic aortic aneurysm, without rupture, unspecified: Secondary | ICD-10-CM

## 2018-07-02 DIAGNOSIS — Z952 Presence of prosthetic heart valve: Secondary | ICD-10-CM

## 2018-07-03 ENCOUNTER — Other Ambulatory Visit: Payer: Self-pay | Admitting: *Deleted

## 2018-07-03 ENCOUNTER — Telehealth: Payer: Self-pay | Admitting: *Deleted

## 2018-07-03 DIAGNOSIS — Z952 Presence of prosthetic heart valve: Secondary | ICD-10-CM

## 2018-07-03 DIAGNOSIS — E7800 Pure hypercholesterolemia, unspecified: Secondary | ICD-10-CM

## 2018-07-03 MED ORDER — ATORVASTATIN CALCIUM 20 MG PO TABS: 20.0000 mg | ORAL_TABLET | Freq: Every day | ORAL | 3 refills | Status: DC

## 2018-07-03 NOTE — Telephone Encounter (Signed)
-----   Message from Lorretta Harp, MD sent at 07/01/2018  6:02 PM EDT ----- LDL 145.  Not at goal for primary prevention.  Would benefit from being on a low-dose statin, atorvastatin 20 mg a day.  Recheck in 3 months.

## 2018-07-03 NOTE — Telephone Encounter (Signed)
pt aware of results, New script sent to the pharmacy and Lab orders mailed to the pt

## 2018-07-24 DIAGNOSIS — M79642 Pain in left hand: Secondary | ICD-10-CM | POA: Diagnosis not present

## 2018-07-24 DIAGNOSIS — R0781 Pleurodynia: Secondary | ICD-10-CM | POA: Diagnosis not present

## 2019-06-22 ENCOUNTER — Other Ambulatory Visit: Payer: Self-pay

## 2019-06-22 ENCOUNTER — Ambulatory Visit (HOSPITAL_COMMUNITY): Payer: Federal, State, Local not specified - PPO | Attending: Cardiology

## 2019-06-22 DIAGNOSIS — Z952 Presence of prosthetic heart valve: Secondary | ICD-10-CM | POA: Diagnosis not present

## 2019-06-23 ENCOUNTER — Other Ambulatory Visit: Payer: Self-pay | Admitting: *Deleted

## 2019-06-23 DIAGNOSIS — Z952 Presence of prosthetic heart valve: Secondary | ICD-10-CM

## 2019-07-08 ENCOUNTER — Other Ambulatory Visit: Payer: Self-pay | Admitting: Cardiovascular Disease

## 2019-07-08 DIAGNOSIS — E7800 Pure hypercholesterolemia, unspecified: Secondary | ICD-10-CM

## 2019-08-23 ENCOUNTER — Telehealth: Payer: Self-pay

## 2019-08-23 NOTE — Telephone Encounter (Signed)
Contacted pt regarding setting up 1 yr f/u appt with Dr. Gwenlyn Found. Pt set for 08/24/2019 at 8:45am. Pt verbalized understanding

## 2019-08-24 ENCOUNTER — Other Ambulatory Visit: Payer: Self-pay

## 2019-08-24 ENCOUNTER — Ambulatory Visit (INDEPENDENT_AMBULATORY_CARE_PROVIDER_SITE_OTHER): Payer: Federal, State, Local not specified - PPO | Admitting: Cardiovascular Disease

## 2019-08-24 ENCOUNTER — Encounter: Payer: Self-pay | Admitting: Cardiovascular Disease

## 2019-08-24 VITALS — BP 140/86 | HR 63 | Temp 97.2°F | Ht 79.0 in | Wt 157.0 lb

## 2019-08-24 DIAGNOSIS — E7800 Pure hypercholesterolemia, unspecified: Secondary | ICD-10-CM

## 2019-08-24 DIAGNOSIS — Z952 Presence of prosthetic heart valve: Secondary | ICD-10-CM | POA: Diagnosis not present

## 2019-08-24 DIAGNOSIS — E782 Mixed hyperlipidemia: Secondary | ICD-10-CM

## 2019-08-24 DIAGNOSIS — I712 Thoracic aortic aneurysm, without rupture, unspecified: Secondary | ICD-10-CM

## 2019-08-24 DIAGNOSIS — E785 Hyperlipidemia, unspecified: Secondary | ICD-10-CM | POA: Insufficient documentation

## 2019-08-24 DIAGNOSIS — F17200 Nicotine dependence, unspecified, uncomplicated: Secondary | ICD-10-CM

## 2019-08-24 LAB — HEPATIC FUNCTION PANEL
ALT: 24 IU/L (ref 0–44)
AST: 21 IU/L (ref 0–40)
Albumin: 4.6 g/dL (ref 4.0–5.0)
Alkaline Phosphatase: 75 IU/L (ref 39–117)
Bilirubin Total: 0.4 mg/dL (ref 0.0–1.2)
Bilirubin, Direct: 0.11 mg/dL (ref 0.00–0.40)
Total Protein: 7 g/dL (ref 6.0–8.5)

## 2019-08-24 LAB — LIPID PANEL
Chol/HDL Ratio: 3.3 ratio (ref 0.0–5.0)
Cholesterol, Total: 166 mg/dL (ref 100–199)
HDL: 50 mg/dL (ref >39)
LDL Chol Calc (NIH): 105 mg/dL — ABNORMAL HIGH (ref 0–99)
Triglycerides: 57 mg/dL (ref 0–149)
VLDL Cholesterol Cal: 11 mg/dL (ref 5–40)

## 2019-08-24 NOTE — Assessment & Plan Note (Signed)
Continue tobacco abuse now smoking 2 packs/day up from 1 pack/day a year ago because of "stress".

## 2019-08-24 NOTE — Assessment & Plan Note (Signed)
History of thoracic aortic aneurysm status post resection and grafting on 01/13/2006 by Dr. Servando Snare with bioprosthetic aortic valve replacement and coronary artery reimplantation.  Cardiac catheterization performed by myself 3/21/7 revealed normal coronary arteries and normal LV function.  Recent 2D echocardiogram performed 06/22/2019 revealed normal LV size and function, aortic root measuring 38 mm with normal Lee functioning aortic bioprosthetic valve.  We will continue to follow this annually by 2D echocardiogram.

## 2019-08-24 NOTE — Assessment & Plan Note (Signed)
History of hyperlipidemia on atorvastatin 20 mg a day with lipid profile performed 07/01/2018 revealing a total cholesterol 203, LDL 145 and HDL 43.  We will recheck a lipid liver profile today.

## 2019-08-24 NOTE — Assessment & Plan Note (Signed)
History of Marfan syndrome with ascending thoracic aortic aneurysm resected in the past.

## 2019-08-24 NOTE — Progress Notes (Signed)
08/24/2019 Victoria Fiorito Mezquita   [birth year]  [id number]  Primary Physician Sampson Si Salvadore Oxford, NP Primary Cardiologist: [name] MD Milagros Loll, [place], [place]  HPI:  Christopher Ball is a 48 y.o.  tall and thin appearing married Caucasian male father of 2 children who does not work. I last saw him in the office  06/24/2018. He has a history of Marfan syndrome and underwent cardiac catheterization by myself 12/04/05 revealing normal coronary arteries and normal LV function. He ultimately underwent thoracic aortic aneurysm resection and grafting on 01/13/06 by Dr. [name] with aortic valve replacement using a bioprosthesis and coronary artery reimplantation. As of the problems include continued tobacco abuse 1 pack per day.   Since I saw him a year ago he is remained stable.  He denies chest pain or shortness of breath.  He has increased his smoking from 1 pack a day up to 2 packs/day because of "stress" related to property that he owns. His most recent 2D echo performed 06/22/2019 revealed normal LV size and function, normal aortic root dimensions of 30 mm and a well-functioning aortic bioprosthesis.   Current Meds  Medication Sig   atorvastatin (LIPITOR) 20 MG tablet TAKE 1 TABLET BY MOUTH EVERY DAY   clindamycin (CLEOCIN) 150 MG capsule Clindamycin 150 mg capsule = take 4 capsules (600 mg) 1 hour prior to dental procedure   fluticasone (FLONASE) 50 MCG/ACT nasal spray Place 2 sprays into both nostrils daily. MUST SCHEDULE ANNUAL EXAM   Current Facility-Administered Medications for the 08/24/19 encounter (Office Visit) with [name], MD  Medication   cetirizine (ZYRTEC) tablet 10 mg     Allergies  Allergen Reactions   Augmentin [Amoxicillin-Pot Clavulanate] Shortness Of Breath and Nausea And Vomiting    Social History   Socioeconomic History   Marital status: Married    Spouse name: Not on file   Number of children: Not on file   Years of education: Not on file     Highest education level: Not on file  Occupational History   Not on file  Social Needs   Financial resource strain: Not on file   Food insecurity    Worry: Not on file    Inability: Not on file   Transportation needs    Medical: Not on file    Non-medical: Not on file  Tobacco Use   Smoking status: Current Every Day Smoker    Packs/day: 1.00    Years: 20.00    Pack years: 20.00   Smokeless tobacco: Never Used  Substance and Sexual Activity   Alcohol use: Yes    Alcohol/week: 1.0 standard drinks    Types: 1 Shots of liquor per week    Comment: rare   Drug use: No   Sexual activity: Yes  Lifestyle   Physical activity    Days per week: Not on file    Minutes per session: Not on file   Stress: Not on file  Relationships   Social connections    Talks on phone: Not on file    Gets together: Not on file    Attends religious service: Not on file    Active member of club or organization: Not on file    Attends meetings of clubs or organizations: Not on file    Relationship status: Not on file   Intimate partner violence    Fear of current or ex partner: Not on file    Emotionally abused: Not on file  Physically abused: Not on file    Forced sexual activity: Not on file  Other Topics Concern   Not on file  Social History Narrative   Not on file     Review of Systems: General: negative for chills, fever, night sweats or weight changes.  Cardiovascular: negative for chest pain, dyspnea on exertion, edema, orthopnea, palpitations, paroxysmal nocturnal dyspnea or shortness of breath Dermatological: negative for rash Respiratory: negative for cough or wheezing Urologic: negative for hematuria Abdominal: negative for nausea, vomiting, diarrhea, bright red blood per rectum, melena, or hematemesis Neurologic: negative for visual changes, syncope, or dizziness All other systems reviewed and are otherwise negative except as noted above.    Blood pressure  140/86, pulse 63, temperature (!) 97.2 F (36.2 C), height 6\' 7"  (2.007 m), weight 157 lb (71.2 kg).  General appearance: alert and no distress Neck: no adenopathy, no carotid bruit, no JVD, supple, symmetrical, trachea midline and thyroid not enlarged, symmetric, no tenderness/mass/nodules Lungs: clear to auscultation bilaterally Heart: regular rate and rhythm, S1, S2 normal, no murmur, click, rub or gallop Extremities: extremities normal, atraumatic, no cyanosis or edema Pulses: 2+ and symmetric Skin: Skin color, texture, turgor normal. No rashes or lesions Neurologic: Alert and oriented X 3, normal strength and tone. Normal symmetric reflexes. Normal coordination and gait  EKG sinus rhythm at 63 with evidence of LVH with early repolarization changes.  I personally reviewed this EKG.  ASSESSMENT AND PLAN:   Marfan syndrome History of Marfan syndrome with ascending thoracic aortic aneurysm resected in the past.  Thoracic aortic aneurysm History of thoracic aortic aneurysm status post resection and grafting on 01/13/2006 by Dr. [name] with bioprosthetic aortic valve replacement and coronary artery reimplantation.  Cardiac catheterization performed by myself 3/21/7 revealed normal coronary arteries and normal LV function.  Recent 2D echocardiogram performed 06/22/2019 revealed normal LV size and function, aortic root measuring 38 mm with normal Lee functioning aortic bioprosthetic valve.  We will continue to follow this annually by 2D echocardiogram.  Smoker Continue tobacco abuse now smoking 2 packs/day up from 1 pack/day a year ago because of "stress".  Hyperlipidemia History of hyperlipidemia on atorvastatin 20 mg a day with lipid profile performed 07/01/2018 revealing a total cholesterol 203, LDL 145 and HDL 43.  We will recheck a lipid liver profile today.      [name] MD FACP,FACC,FAHA, [facility] 08/24/2019 9:17 AM

## 2019-08-24 NOTE — Patient Instructions (Signed)
Medication Instructions:  Your physician recommends that you continue on your current medications as directed. Please refer to the Current Medication list given to you today.  If you need a refill on your cardiac medications before your next appointment, please call your pharmacy.   Lab work: Lipids and Hepatic Funciton If you have labs (blood work) drawn today and your tests are completely normal, you will receive your results only by: [place] (if you have MyChart) OR A paper copy in the mail If you have any lab test that is abnormal or we need to change your treatment, we will call you to review the results.  Testing/Procedures: Your physician has requested that you have an echocardiogram in 1 year. Echocardiography is a painless test that uses sound waves to create images of your heart. It provides your doctor with information about the size and shape of your heart and how well your heart's chambers and valves are working. This procedure takes approximately one hour. There are no restrictions for this procedure. [place] [address]   Follow-Up: At [facility], you and your health needs are our priority.  As part of our continuing mission to provide you with exceptional heart care, we have created designated Provider Care Teams.  These Care Teams include your primary Cardiologist (physician) and Advanced Practice Providers (APPs -  Physician Assistants and Nurse Practitioners) who all work together to provide you with the care you need, when you need it. You may see Quay Burow, MD or one of the following Advanced Practice Providers on your designated Care Team:    Kerin Ransom, PA-C  [place], [place]  Coletta Memos, [place] Your physician wants you to follow-up in: 1 year

## 2019-10-02 ENCOUNTER — Other Ambulatory Visit: Payer: Self-pay | Admitting: Cardiovascular Disease

## 2019-10-02 DIAGNOSIS — E7800 Pure hypercholesterolemia, unspecified: Secondary | ICD-10-CM

## 2020-01-25 ENCOUNTER — Encounter: Payer: Self-pay | Admitting: Internal Medicine

## 2020-01-25 ENCOUNTER — Ambulatory Visit: Payer: Federal, State, Local not specified - PPO | Admitting: Internal Medicine

## 2020-01-25 ENCOUNTER — Other Ambulatory Visit: Payer: Self-pay

## 2020-01-25 VITALS — BP 110/80 | HR 73 | Temp 97.7°F | Ht 79.0 in | Wt 154.0 lb

## 2020-01-25 DIAGNOSIS — S90862A Insect bite (nonvenomous), left foot, initial encounter: Secondary | ICD-10-CM | POA: Diagnosis not present

## 2020-01-25 DIAGNOSIS — L089 Local infection of the skin and subcutaneous tissue, unspecified: Secondary | ICD-10-CM

## 2020-01-25 DIAGNOSIS — W57XXXA Bitten or stung by nonvenomous insect and other nonvenomous arthropods, initial encounter: Secondary | ICD-10-CM | POA: Diagnosis not present

## 2020-01-25 MED ORDER — DOXYCYCLINE HYCLATE 100 MG PO TABS: 100.0000 mg | ORAL_TABLET | Freq: Two times a day (BID) | ORAL | 0 refills | Status: DC

## 2020-01-25 NOTE — Progress Notes (Signed)
Subjective:    Patient ID: Christopher Ball, male    DOB: 1970/10/09, 49 y.o.   MRN: [id number]  HPI  Pt presents to the clinic today with c/o a tick bite to the top of his left foot. He noticed this 2 weeks ago. He reports the area is red and swollen. It is tender to touch. He thinks the tick may still be attached. He has not taken anything OTC for this.  Review of Systems  Past Medical History:  Diagnosis Date  . Marfan syndrome   . Stomach problems   . 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 Tobacco abuse     Current Outpatient Medications  Medication Sig Dispense Refill  . atorvastatin (LIPITOR) 20 MG tablet Take 1 tablet (20 mg total) by mouth daily. 90 tablet 2  . clindamycin (CLEOCIN) 150 MG capsule Clindamycin 150 mg capsule = take 4 capsules (600 mg) 1 hour prior to dental procedure 4 capsule 0  . fluticasone (FLONASE) 50 MCG/ACT nasal spray Place 2 sprays into both nostrils daily. MUST SCHEDULE ANNUAL EXAM 16 g 1   Current Facility-Administered Medications  Medication Dose Route Frequency Provider Last Rate Last Admin  . cetirizine (ZYRTEC) tablet 10 mg  10 mg Oral Daily Lorre Munroe, NP        Allergies  Allergen Reactions  . Augmentin [Amoxicillin-Pot Clavulanate] Shortness Of Breath and Nausea And Vomiting    Family History  Problem Relation Age of Onset  . Asthma Mother   . Early death Father   . Heart disease Father   . Asthma Brother   . Diabetes Maternal Grandmother   . Cancer Neg Hx   . Stroke Neg Hx   . Kidney disease Neg Hx   . Hypertension Neg Hx   . Hyperlipidemia Neg Hx     Social History   Socioeconomic History  . Marital status: Married    Spouse name: Not on file  . Number of children: Not on file  . Years of education: Not on file  . Highest education level: Not on file  Occupational History  . Not on file  Tobacco Use  . Smoking status: Current Every Day  Smoker    Packs/day: 1.00    Years: 20.00    Pack years: 20.00  . Smokeless tobacco: Never Used  Substance and Sexual Activity  . Alcohol use: Yes    Alcohol/week: 1.0 standard drinks    Types: 1 Shots of liquor per week    Comment: rare  . Drug use: No  . Sexual activity: Yes  Other Topics Concern  . Not on file  Social History Narrative  . Not on file   Social Determinants of Health   Financial Resource Strain:   . Difficulty of Paying Living Expenses:   Food Insecurity:   . Worried About Programme researcher, broadcasting/film/video in the Last Year:   . Barista in the Last Year:   Transportation Needs:   . Freight forwarder (Medical):   Marland Kitchen Lack of Transportation (Non-Medical):   Physical Activity:   . Days of Exercise per Week:   . Minutes of Exercise per Session:   Stress:   . Feeling of Stress :   Social Connections:   . Frequency of Communication with Friends and Family:   . Frequency of Social Gatherings with Friends and Family:   . Attends Religious Services:   .  Active Member of Clubs or Organizations:   . Attends Banker Meetings:   Marland Kitchen Marital Status:   Intimate Partner Violence:   . Fear of Current or Ex-Partner:   . Emotionally Abused:   Marland Kitchen Physically Abused:   . Sexually Abused:      Constitutional: Denies fever, malaise, fatigue, headache or abrupt weight changes.  Respiratory: Denies difficulty breathing, shortness of breath, cough or sputum production.   Cardiovascular: Denies chest pain, chest tightness, palpitations or swelling in the hands or feet.  Skin: Pt reports tick bite of left foot, redness. Denies rashes, or ulcercations.    No other specific complaints in a complete review of systems (except as listed in HPI above).     Objective:   Physical Exam  BP 110/80 (BP Location: Left Arm, Patient Position: Sitting, Cuff Size: Normal)   Pulse 73   Temp 97.7 F (36.5 C) (Temporal)   Ht 6\' 7"  (2.007 m)   Wt 154 lb (69.9 kg)   SpO2 97%    BMI 17.35 kg/m   Wt Readings from Last 3 Encounters:  08/24/19 157 lb (71.2 kg)  06/24/18 153 lb 12.8 oz (69.8 kg)  06/27/17 150 lb 8 oz (68.3 kg)    General: Appears his stated age, well developed, well nourished in NAD. Skin: Scab noted to top of left foot, no tick head present once scab removed. Infection noted. Cardiovascular: Normal rate and rhythm. Pulmonary/Chest: Normal effort and positive vesicular breath sounds. No respiratory distress. No wheezes, rales or ronchi noted.  Musculoskeletal:  No difficulty with gait.  Neurological: Alert and oriented.    BMET    Component Value Date/Time   NA 142 06/23/2017 0917   K 4.9 06/23/2017 0917   CL 107 (H) 06/23/2017 0917   CO2 23 06/23/2017 0917   GLUCOSE 94 06/23/2017 0917   GLUCOSE 89 08/05/2014 1510   BUN 20 06/23/2017 0917   CREATININE 1.27 06/23/2017 0917   CALCIUM 9.4 06/23/2017 0917   GFRNONAA 68 06/23/2017 0917   GFRAA 78 06/23/2017 0917    Lipid Panel     Component Value Date/Time   CHOL 166 08/24/2019 0942   TRIG 57 08/24/2019 0942   HDL 50 08/24/2019 0942   CHOLHDL 3.3 08/24/2019 0942   LDLCALC 105 (H) 08/24/2019 0942    CBC    Component Value Date/Time   WBC 8.3 06/23/2017 0917   WBC 8.2 08/05/2014 1510   RBC 4.41 06/23/2017 0917   RBC 4.00 (L) 08/05/2014 1510   HGB 14.4 06/23/2017 0917   HCT 41.6 06/23/2017 0917   PLT 220 06/23/2017 0917   MCV 94 06/23/2017 0917   MCH 32.7 06/23/2017 0917   MCH 31.8 08/05/2014 1510   MCHC 34.6 06/23/2017 0917   MCHC 33.6 08/05/2014 1510   RDW 12.9 06/23/2017 0917   LYMPHSABS 2.3 01/01/2014 2000   MONOABS 0.6 01/01/2014 2000   EOSABS 0.4 01/01/2014 2000   BASOSABS 0.1 01/01/2014 2000    Hgb A1C Lab Results  Component Value Date   HGBA1C 5.4 06/23/2017           Assessment & Plan:   Infected Tick Bite of Left Foot:  RX for Doxycycline 100 mg BID x 10 days Monitor for increased redness, swelling  Return precautions discussed  Nicki Reaper,  NP This visit occurred during the SARS-CoV-2 public health emergency.  Safety protocols were in place, including screening questions prior to the visit, additional usage of staff PPE, and extensive cleaning  of exam room while observing appropriate contact time as indicated for disinfecting solutions.

## 2020-01-25 NOTE — Patient Instructions (Signed)

## 2020-04-18 ENCOUNTER — Encounter: Payer: Federal, State, Local not specified - PPO | Admitting: Internal Medicine

## 2020-04-20 DIAGNOSIS — K080 Exfoliation of teeth due to systemic causes: Secondary | ICD-10-CM | POA: Diagnosis not present

## 2020-04-25 ENCOUNTER — Encounter: Payer: Self-pay | Admitting: Internal Medicine

## 2020-04-25 ENCOUNTER — Other Ambulatory Visit: Payer: Self-pay

## 2020-04-25 ENCOUNTER — Ambulatory Visit (INDEPENDENT_AMBULATORY_CARE_PROVIDER_SITE_OTHER): Payer: Federal, State, Local not specified - PPO | Admitting: Internal Medicine

## 2020-04-25 VITALS — BP 110/74 | HR 52 | Temp 98.0°F | Ht 76.75 in | Wt 150.0 lb

## 2020-04-25 DIAGNOSIS — Q8740 Marfan's syndrome, unspecified: Secondary | ICD-10-CM | POA: Diagnosis not present

## 2020-04-25 DIAGNOSIS — Z0001 Encounter for general adult medical examination with abnormal findings: Secondary | ICD-10-CM

## 2020-04-25 DIAGNOSIS — E782 Mixed hyperlipidemia: Secondary | ICD-10-CM | POA: Diagnosis not present

## 2020-04-25 NOTE — Patient Instructions (Signed)

## 2020-04-25 NOTE — Assessment & Plan Note (Signed)
Follows with cardiology 

## 2020-04-25 NOTE — Assessment & Plan Note (Signed)
HFP and lipid profile reviewed Continue Atorvastatin Encouraged him to consume a low fat diet

## 2020-04-25 NOTE — Progress Notes (Signed)
Subjective:    Patient ID: Christopher Ball, male    DOB: [birth year], 49 y.o.   MRN: [id number]  HPI  Patient presents the clinic today for his annual exam.  Marfan's: Hx of thoracic aneurysm. He follows with cardiology yearly  HLD: His last LDL was 105, 08/2019. He denies myalgias on Atorvastatin. He does not consume a low fat diet.  Flu: 06/2019 Tetanus: 05/2015 Covid: Pfizer Colon screening: never Vision screening: annually Dentist: annually  Diet: He does eat meat. He consumes fruits and veggies daily. He does eat some fried foods. He drinks mostly water, sweet tea, coffee Exercise: Walking  Review of Systems      Past Medical History:  Diagnosis Date  . Marfan syndrome   . Stomach problems   . 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 Tobacco abuse     Current Outpatient Medications  Medication Sig Dispense Refill  . atorvastatin (LIPITOR) 20 MG tablet Take 1 tablet (20 mg total) by mouth daily. 90 tablet 2  . clindamycin (CLEOCIN) 150 MG capsule Clindamycin 150 mg capsule = take 4 capsules (600 mg) 1 hour prior to dental procedure 4 capsule 0  . doxycycline (VIBRA-TABS) 100 MG tablet Take 1 tablet (100 mg total) by mouth 2 (two) times daily. 20 tablet 0  . fluticasone (FLONASE) 50 MCG/ACT nasal spray Place 2 sprays into both nostrils daily. MUST SCHEDULE ANNUAL EXAM 16 g 1   Current Facility-Administered Medications  Medication Dose Route Frequency Provider Last Rate Last Admin  . cetirizine (ZYRTEC) tablet 10 mg  10 mg Oral Daily Lorre Munroe, NP        Allergies  Allergen Reactions  . Augmentin [Amoxicillin-Pot Clavulanate] Shortness Of Breath and Nausea And Vomiting    Family History  Problem Relation Age of Onset  . Asthma Mother   . Early death Father   . Heart disease Father   . Asthma Brother   . Diabetes Maternal Grandmother   . Cancer Neg Hx   . Stroke Neg Hx   . Kidney  disease Neg Hx   . Hypertension Neg Hx   . Hyperlipidemia Neg Hx     Social History   Socioeconomic History  . Marital status: Married    Spouse name: Not on file  . Number of children: Not on file  . Years of education: Not on file  . Highest education level: Not on file  Occupational History  . Not on file  Tobacco Use  . Smoking status: Current Every Day Smoker    Packs/day: 1.00    Years: 20.00    Pack years: 20.00  . Smokeless tobacco: Never Used  Substance and Sexual Activity  . Alcohol use: Yes    Alcohol/week: 1.0 standard drink    Types: 1 Shots of liquor per week    Comment: rare  . Drug use: No  . Sexual activity: Yes  Other Topics Concern  . Not on file  Social History Narrative  . Not on file   Social Determinants of Health   Financial Resource Strain:   . Difficulty of Paying Living Expenses:   Food Insecurity:   . Worried About Programme researcher, broadcasting/film/video in the Last Year:   . Barista in the Last Year:   Transportation Needs:   . Freight forwarder (Medical):   Marland Kitchen Lack of Transportation (Non-Medical):   Physical Activity:   .  Days of Exercise per Week:   . Minutes of Exercise per Session:   Stress:   . Feeling of Stress :   Social Connections:   . Frequency of Communication with Friends and Family:   . Frequency of Social Gatherings with Friends and Family:   . Attends Religious Services:   . Active Member of Clubs or Organizations:   . Attends Banker Meetings:   Marland Kitchen Marital Status:   Intimate Partner Violence:   . Fear of Current or Ex-Partner:   . Emotionally Abused:   Marland Kitchen Physically Abused:   . Sexually Abused:      Constitutional: Denies fever, malaise, fatigue, headache or abrupt weight changes.  HEENT: Denies eye pain, eye redness, ear pain, ringing in the ears, wax buildup, runny nose, nasal congestion, bloody nose, or sore throat. Respiratory: Denies difficulty breathing, shortness of breath, cough or sputum  production.   Cardiovascular: Denies chest pain, chest tightness, palpitations or swelling in the hands or feet.  Gastrointestinal: Pt reports intermittent reflux. Denies abdominal pain, bloating, constipation, diarrhea or blood in the stool.  GU: Denies urgency, frequency, pain with urination, burning sensation, blood in urine, odor or discharge. Musculoskeletal: Pt reports intermittent back pain. Denies decrease in range of motion, difficulty with gait, muscle pain or joint swelling.  Skin: Denies redness, rashes, lesions or ulcercations.  Neurological: Denies dizziness, difficulty with memory, difficulty with speech or problems with balance and coordination.  Psych: Denies anxiety, depression, SI/HI.  No other specific complaints in a complete review of systems (except as listed in HPI above).  Objective:   Physical Exam BP 110/74   Pulse (!) 52   Temp 98 F (36.7 C) (Temporal)   Ht 6' 4.75" (1.949 m)   Wt 150 lb (68 kg)   SpO2 98%   BMI 17.90 kg/m   Wt Readings from Last 3 Encounters:  01/25/20 154 lb (69.9 kg)  08/24/19 157 lb (71.2 kg)  06/24/18 153 lb 12.8 oz (69.8 kg)    General: Appears his stated age, well developed, well nourished in NAD. Skin: Warm, dry and intact. Multiple scabs, ulcerations noted of BUE. HEENT: Head: normal shape and size; Eyes: sclera white, no icterus, conjunctiva pink, PERRLA and EOMs intact;  Neck:  Neck supple, trachea midline. No masses, lumps or thyromegaly present.  Cardiovascular: Normal rate and rhythm. S1,S2 noted.  No murmur, rubs or gallops noted. No JVD or BLE edema.  Pulmonary/Chest: Normal effort and positive vesicular breath sounds. No respiratory distress. No wheezes, rales or ronchi noted.  Abdomen: Soft and nontender. Normal bowel sounds. No distention or masses noted. Liver, spleen and kidneys non palpable. Musculoskeletal: Pectus carinatum noted. Strength 5/5 BUE/BLE. No difficulty with gait.  Neurological: Alert and oriented.  Cranial nerves II-XII grossly intact. Coordination normal.  Psychiatric: Mood and affect normal. Behavior is normal. Judgment and thought content normal.     BMET    Component Value Date/Time   NA 142 06/23/2017 0917   K 4.9 06/23/2017 0917   CL 107 (H) 06/23/2017 0917   CO2 23 06/23/2017 0917   GLUCOSE 94 06/23/2017 0917   GLUCOSE 89 08/05/2014 1510   BUN 20 06/23/2017 0917   CREATININE 1.27 06/23/2017 0917   CALCIUM 9.4 06/23/2017 0917   GFRNONAA 68 06/23/2017 0917   GFRAA 78 06/23/2017 0917    Lipid Panel     Component Value Date/Time   CHOL 166 08/24/2019 0942   TRIG 57 08/24/2019 0942   HDL 50 08/24/2019  0942   CHOLHDL 3.3 08/24/2019 0942   LDLCALC 105 (H) 08/24/2019 0942    CBC    Component Value Date/Time   WBC 8.3 06/23/2017 0917   WBC 8.2 08/05/2014 1510   RBC 4.41 06/23/2017 0917   RBC 4.00 (L) 08/05/2014 1510   HGB 14.4 06/23/2017 0917   HCT 41.6 06/23/2017 0917   PLT 220 06/23/2017 0917   MCV 94 06/23/2017 0917   MCH 32.7 06/23/2017 0917   MCH 31.8 08/05/2014 1510   MCHC 34.6 06/23/2017 0917   MCHC 33.6 08/05/2014 1510   RDW 12.9 06/23/2017 0917   LYMPHSABS 2.3 01/01/2014 2000   MONOABS 0.6 01/01/2014 2000   EOSABS 0.4 01/01/2014 2000   BASOSABS 0.1 01/01/2014 2000    Hgb A1C Lab Results  Component Value Date   HGBA1C 5.4 06/23/2017            Assessment & Plan:   Preventative Health Maintenance:  Encouraged him to get a flu shot in the fall Tetanus UTD Encouraged him to get a Covid vaccine He declines referral to GI for colonoscopy, Cologuard or Ifob Encouraged him to consume a balanced diet and exercise regimen Advised him to see an eye doctor and dentist annually Labs from 08/2019 reviewed  RTC in 1 year, sooner if needed Nicki Reaper, NP This visit occurred during the SARS-CoV-2 public health emergency.  Safety protocols were in place, including screening questions prior to the visit, additional usage of staff PPE, and  extensive cleaning of exam room while observing appropriate contact time as indicated for disinfecting solutions.

## 2020-06-02 DIAGNOSIS — Z20822 Contact with and (suspected) exposure to covid-19: Secondary | ICD-10-CM | POA: Diagnosis not present

## 2020-07-09 ENCOUNTER — Other Ambulatory Visit: Payer: Self-pay | Admitting: Cardiovascular Disease

## 2020-07-09 DIAGNOSIS — E7800 Pure hypercholesterolemia, unspecified: Secondary | ICD-10-CM

## 2020-07-13 ENCOUNTER — Ambulatory Visit (HOSPITAL_COMMUNITY): Payer: Federal, State, Local not specified - PPO | Attending: Cardiovascular Disease

## 2020-07-13 ENCOUNTER — Other Ambulatory Visit: Payer: Self-pay

## 2020-07-13 DIAGNOSIS — Z952 Presence of prosthetic heart valve: Secondary | ICD-10-CM | POA: Diagnosis not present

## 2020-07-13 LAB — ECHOCARDIOGRAM COMPLETE
AR max vel: 1.84 cm2
AV Area VTI: 2 cm2
AV Area mean vel: 1.84 cm2
AV Mean grad: 6 mmHg
AV Peak grad: 13.1 mmHg
Ao pk vel: 1.81 m/s
Area-P 1/2: 3.85 cm2
S' Lateral: 2.9 cm

## 2020-10-16 ENCOUNTER — Encounter: Payer: Self-pay | Admitting: Internal Medicine

## 2020-10-16 ENCOUNTER — Ambulatory Visit: Payer: Federal, State, Local not specified - PPO | Admitting: Internal Medicine

## 2020-10-16 ENCOUNTER — Other Ambulatory Visit: Payer: Self-pay

## 2020-10-16 VITALS — BP 134/78 | HR 73 | Temp 97.5°F | Wt 149.0 lb

## 2020-10-16 DIAGNOSIS — S39840A Fracture of corpus cavernosum penis, initial encounter: Secondary | ICD-10-CM

## 2020-10-16 NOTE — Progress Notes (Signed)
Subjective:    Patient ID: Christopher Ball, male    DOB: [birth year], 50 y.o.   MRN: [id number]  HPI  Pt presents to the clinic today with c/o a penile injury. This occurred 1.5 weeks ago. He fell off the bed while trying to have sexual intercourse. As he was re penetrating, his penis bent downward, causing him extreme pain. Since that time, he reports a hard area to the base of his penis. There is no redness, bruising, swelling, tenderness or pain. He is not having any urinary frequency, urgency, dysuria or blood in his urine. He denies testicular pain, swelling or mass. He has been unable to obtain an erection since this penile injury. He has not tried anything OTC for this.   Review of Systems  Past Medical History:  Diagnosis Date  . Marfan syndrome   . Stomach problems   . 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 Tobacco abuse     Current Outpatient Medications  Medication Sig Dispense Refill  . atorvastatin (LIPITOR) 20 MG tablet TAKE 1 TABLET BY MOUTH EVERY DAY 90 tablet 2  . clindamycin (CLEOCIN) 150 MG capsule Clindamycin 150 mg capsule = take 4 capsules (600 mg) 1 hour prior to dental procedure 4 capsule 0   No current facility-administered medications for this visit.    Allergies  Allergen Reactions  . Augmentin [Amoxicillin-Pot Clavulanate] Shortness Of Breath and Nausea And Vomiting    Family History  Problem Relation Age of Onset  . Asthma Mother   . Early death Father   . Heart disease Father   . Asthma Brother   . Diabetes Maternal Grandmother   . Cancer Neg Hx   . Stroke Neg Hx   . Kidney disease Neg Hx   . Hypertension Neg Hx   . Hyperlipidemia Neg Hx     Social History   Socioeconomic History  . Marital status: Married    Spouse name: Not on file  . Number of children: Not on file  . Years of education: Not on file  . Highest education level: Not on file  Occupational History   . Not on file  Tobacco Use  . Smoking status: Current Every Day Smoker    Packs/day: 1.00    Years: 20.00    Pack years: 20.00    Types: Cigars  . Smokeless tobacco: Never Used  Substance and Sexual Activity  . Alcohol use: Yes    Alcohol/week: 1.0 standard drink    Types: 1 Shots of liquor per week    Comment: rare  . Drug use: No  . Sexual activity: Yes  Other Topics Concern  . Not on file  Social History Narrative  . Not on file   Social Determinants of Health   Financial Resource Strain: Not on file  Food Insecurity: Not on file  Transportation Needs: Not on file  Physical Activity: Not on file  Stress: Not on file  Social Connections: Not on file  Intimate Partner Violence: Not on file     Constitutional: Denies fever, malaise, fatigue, headache or abrupt weight changes.  Respiratory: Denies difficulty breathing, shortness of breath, cough or sputum production.   Cardiovascular: Denies chest pain, chest tightness, palpitations or swelling in the hands or feet.  Gastrointestinal: Denies abdominal pain, bloating, constipation, diarrhea or blood in the stool.  GU: Pt reports penile injury. Denies urgency, frequency, pain with urination, burning sensation, blood  in urine, odor or discharge.   No other specific complaints in a complete review of systems (except as listed in HPI above).     Objective:   Physical Exam  BP 134/78   Pulse 73   Temp (!) 97.5 F (36.4 C) (Temporal)   Wt 149 lb (67.6 kg)   SpO2 97%   BMI 17.78 kg/m   Wt Readings from Last 3 Encounters:  04/25/20 150 lb (68 kg)  01/25/20 154 lb (69.9 kg)  08/24/19 157 lb (71.2 kg)    General: Appears his stated age, gangly, in NAD. Cardiovascular: Normal rate. Pulmonary/Chest: Normal effort. GU: Normal male anatomy. 2 cm area of hard tissue noted at the base of the penis. No pain with palpation. No abnormal skin lesions. No testicular swelling noted. Neurological: Alert and oriented.    BMET    Component Value Date/Time   NA 142 06/23/2017 0917   K 4.9 06/23/2017 0917   CL 107 (H) 06/23/2017 0917   CO2 23 06/23/2017 0917   GLUCOSE 94 06/23/2017 0917   GLUCOSE 89 08/05/2014 1510   BUN 20 06/23/2017 0917   CREATININE 1.27 06/23/2017 0917   CALCIUM 9.4 06/23/2017 0917   GFRNONAA 68 06/23/2017 0917   GFRAA 78 06/23/2017 0917    Lipid Panel     Component Value Date/Time   CHOL 166 08/24/2019 0942   TRIG 57 08/24/2019 0942   HDL 50 08/24/2019 0942   CHOLHDL 3.3 08/24/2019 0942   LDLCALC 105 (H) 08/24/2019 0942    CBC    Component Value Date/Time   WBC 8.3 06/23/2017 0917   WBC 8.2 08/05/2014 1510   RBC 4.41 06/23/2017 0917   RBC 4.00 (L) 08/05/2014 1510   HGB 14.4 06/23/2017 0917   HCT 41.6 06/23/2017 0917   PLT 220 06/23/2017 0917   MCV 94 06/23/2017 0917   MCH 32.7 06/23/2017 0917   MCH 31.8 08/05/2014 1510   MCHC 34.6 06/23/2017 0917   MCHC 33.6 08/05/2014 1510   RDW 12.9 06/23/2017 0917   LYMPHSABS 2.3 01/01/2014 2000   MONOABS 0.6 01/01/2014 2000   EOSABS 0.4 01/01/2014 2000   BASOSABS 0.1 01/01/2014 2000    Hgb A1C Lab Results  Component Value Date   HGBA1C 5.4 06/23/2017           Assessment & Plan:   Possible Penile Fracture:  Will refer to urology for further evaluation and treatment  Return precautions discussed  Nicki Reaper, NP This visit occurred during the SARS-CoV-2 public health emergency.  Safety protocols were in place, including screening questions prior to the visit, additional usage of staff PPE, and extensive cleaning of exam room while observing appropriate contact time as indicated for disinfecting solutions.

## 2020-10-16 NOTE — Patient Instructions (Signed)
Penile Fracture A penile fracture is a break (fracture) in a layer of tissue that lines the penis when the penis is hardened (erect). A penile fracture may involve a break in one, two, or all three of the following:  The structures inside the penis that fill with blood when the penis is erect (corpora cavernosa).  The tight membrane around the corpora cavernosa (tunica albuginea). If this membrane fractures, it can cause blood to leak into surrounding tissues and collect there (hematoma).  The part of the body that drains urine from the bladder (urethra). This is rare. This may cause bleeding from the urethra or an inability to pass urine. Penile fracture is a medical emergency. What are the causes? This condition is usually caused by sudden force on an erect penis, such as forceful sex. What increases the risk? This condition is more likely to develop in men who have:  Any kind of forceful sex.  Sex with a sexual partner on top. This position makes it more likely for the penis to slip out. The partner's weight on the erect penis may cause a fracture. What are the signs or symptoms? The first signs of penile fracture include:  A snapping or cracking sound.  Severe pain in the penis.  Loss of erection. Within a short time, other signs may develop, including:  Swelling of the penis.  Bruising and discoloration of the penis.  An unusual bend in the penis at the location of the fracture (deformity). How is this diagnosed? This condition may be diagnosed based on:  Your symptoms.  The description of how your injury happened.  A physical exam.  Imaging tests, such as: ? Ultrasound. ? X-ray of blood flow in the penis (cavernosogram). ? X-ray of the urethra (urethrogram). ? MRI. How is this treated? A penile fracture requires emergency surgery to:  Remove hematomas.  Repair fractured tissue and membranes.  Repair the urethra, if necessary. Summary  A penile fracture is  a break (fracture) in a layer of tissue that lines the penis when the penis is hardened (erect).  This condition is usually caused by sudden force on an erect penis, such as forceful sex.  Penile fracture is a medical emergency that requires emergency surgery. This information is not intended to replace advice given to you by your health care provider. Make sure you discuss any questions you have with your health care provider. Document Revised: 09/22/2017 Document Reviewed: 09/22/2017 Elsevier Patient Education  2021 [employer].

## 2020-10-26 ENCOUNTER — Other Ambulatory Visit: Payer: Self-pay

## 2020-10-26 ENCOUNTER — Ambulatory Visit: Payer: Federal, State, Local not specified - PPO | Admitting: Urology

## 2020-10-26 ENCOUNTER — Encounter: Payer: Self-pay | Admitting: Urology

## 2020-10-26 VITALS — BP 135/61 | HR 76 | Ht 75.0 in | Wt 149.0 lb

## 2020-10-26 DIAGNOSIS — N486 Induration penis plastica: Secondary | ICD-10-CM

## 2020-10-26 DIAGNOSIS — N529 Male erectile dysfunction, unspecified: Secondary | ICD-10-CM

## 2020-10-26 NOTE — Progress Notes (Signed)
10/26/2020 10:10 AM   Christopher Ball 1971-07-07 [id number]  Referring provider: Lorre Munroe, NP [address] [place],  [place] [address]  Chief Complaint  Patient presents with  . Other    HPI: Christopher Ball is a 50 y.o. male referred for evaluation of possible penile fracture.   Initially presented to his PCP on 10/16/2020 stating 1.5 weeks prior he was having intercourse and started to fall backwards and in an attempt to keep from falling move forward striking his penis on his partners leg.  States his penis bent and he felt pain but did not have loss of erection, ecchymoses or swelling  Was complaining of a ED at the time of that visit and all also felt a "lump" on his penile shaft  Has noted decreased erections with an hourglass deformity and decreased rigidity distally but feels this is improving  No curvature noted   PMH: Past Medical History:  Diagnosis Date  . Marfan syndrome   . Stomach problems   . 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 Tobacco abuse     Surgical History: Past Surgical History:  Procedure Laterality Date  . AORTIC VALVE REPLACEMENT    . VASECTOMY  09/16/2002    Home Medications:  Allergies as of 10/26/2020      Reactions   Augmentin [amoxicillin-pot Clavulanate] Shortness Of Breath, Nausea And Vomiting      Medication List       Accurate as of October 26, 2020 10:10 AM. If you have any questions, ask your nurse or doctor.        atorvastatin 20 MG tablet Commonly known as: LIPITOR TAKE 1 TABLET BY MOUTH EVERY DAY   clindamycin 150 MG capsule Commonly known as: CLEOCIN Clindamycin 150 mg capsule = take 4 capsules (600 mg) 1 hour prior to dental procedure       Allergies:  Allergies  Allergen Reactions  . Augmentin [Amoxicillin-Pot Clavulanate] Shortness Of Breath and Nausea And Vomiting    Family History: Family History  Problem  Relation Age of Onset  . Asthma Mother   . Early death Father   . Heart disease Father   . Asthma Brother   . Diabetes Maternal Grandmother   . Cancer Neg Hx   . Stroke Neg Hx   . Kidney disease Neg Hx   . Hypertension Neg Hx   . Hyperlipidemia Neg Hx     Social History:  reports that he has been smoking cigars. He has a 20.00 pack-year smoking history. He has never used smokeless tobacco. He reports current alcohol use of about 1.0 standard drink of alcohol per week. He reports that he does not use drugs.   Physical Exam: BP 135/61   Pulse 76   Ht 6\' 3"  (1.905 m)   Wt 149 lb (67.6 kg)   BMI 18.62 kg/m   Constitutional:  Alert and oriented, No acute distress. HEENT: [place] AT, moist mucus membranes.  Trachea midline, no masses. Cardiovascular: No clubbing, cyanosis, or edema. Respiratory: Normal respiratory effort, no increased work of breathing. GU: Phallus circumcised, dorsal proximal/mid shaft plaque ~1.5 x 3 cm Skin: No rashes, bruises or suspicious lesions. Neurologic: Grossly intact, no focal deficits, moving all 4 extremities. Psychiatric: Normal mood and affect.   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 Guy Sandifer at [facility] for evaluation/second opinion   Riki Altes, MD  [facility] [address] [place], [place] [address] [phone]

## 2020-11-02 ENCOUNTER — Telehealth: Payer: Self-pay | Admitting: *Deleted

## 2020-11-02 DIAGNOSIS — N486 Induration penis plastica: Secondary | ICD-10-CM

## 2020-11-02 NOTE — Addendum Note (Signed)
Addended by: Abbie Sons on: 11/02/2020 09:22 AM   Modules accepted: Orders

## 2020-11-02 NOTE — Telephone Encounter (Signed)
Referral order entered

## 2020-11-02 NOTE — Telephone Encounter (Signed)
Patient called and wants to be referred tp   Dr. Francesca Jewett at [facility] for evaluation/second opinion

## 2020-11-15 DIAGNOSIS — N486 Induration penis plastica: Secondary | ICD-10-CM | POA: Diagnosis not present

## 2020-11-15 DIAGNOSIS — Z681 Body mass index (BMI) 19 or less, adult: Secondary | ICD-10-CM | POA: Diagnosis not present

## 2020-11-15 DIAGNOSIS — N5201 Erectile dysfunction due to arterial insufficiency: Secondary | ICD-10-CM | POA: Diagnosis not present

## 2020-11-28 DIAGNOSIS — N5201 Erectile dysfunction due to arterial insufficiency: Secondary | ICD-10-CM | POA: Diagnosis not present

## 2020-11-28 DIAGNOSIS — S39840D Fracture of corpus cavernosum penis, subsequent encounter: Secondary | ICD-10-CM | POA: Diagnosis not present

## 2020-11-28 DIAGNOSIS — Z880 Allergy status to penicillin: Secondary | ICD-10-CM | POA: Diagnosis not present

## 2020-11-28 DIAGNOSIS — N486 Induration penis plastica: Secondary | ICD-10-CM | POA: Diagnosis not present

## 2020-11-28 DIAGNOSIS — X58XXXD Exposure to other specified factors, subsequent encounter: Secondary | ICD-10-CM | POA: Diagnosis not present

## 2020-12-10 DIAGNOSIS — S329XXA Fracture of unspecified parts of lumbosacral spine and pelvis, initial encounter for closed fracture: Secondary | ICD-10-CM | POA: Diagnosis not present

## 2020-12-10 DIAGNOSIS — S39840D Fracture of corpus cavernosum penis, subsequent encounter: Secondary | ICD-10-CM | POA: Diagnosis not present

## 2020-12-10 DIAGNOSIS — X58XXXA Exposure to other specified factors, initial encounter: Secondary | ICD-10-CM | POA: Diagnosis not present

## 2021-04-13 ENCOUNTER — Other Ambulatory Visit: Payer: Self-pay | Admitting: Cardiovascular Disease

## 2021-04-13 DIAGNOSIS — E7800 Pure hypercholesterolemia, unspecified: Secondary | ICD-10-CM

## 2021-04-26 ENCOUNTER — Telehealth: Payer: Self-pay | Admitting: Cardiovascular Disease

## 2021-04-26 MED ORDER — CLINDAMYCIN HCL 150 MG PO CAPS: ORAL_CAPSULE | ORAL | 0 refills | Status: DC

## 2021-04-26 NOTE — Telephone Encounter (Signed)
Refill has been sent in.  Lorretta Harp, MD to Me     2:51 PM Yes, okay to refill

## 2021-04-26 NOTE — Telephone Encounter (Signed)
Spoke to the patient who was requesting a refill of the Clindamycin 600 mg for a dental procedure on 8/26. He has been advised that he is past due for a follow up with Dr. Gwenlyn Found and will need to schedule that. He was last seen in 08/2019

## 2021-04-26 NOTE — Telephone Encounter (Signed)
Wife of the patient called. The patient is supposed to have dental work done 05/01/21 and needs to be pre-medicated. The wife called the pharmacy and there was no medicine there for the patient to take. Please reach out to the patient for further clarification

## 2021-05-12 ENCOUNTER — Other Ambulatory Visit: Payer: Self-pay | Admitting: Cardiovascular Disease

## 2021-05-12 DIAGNOSIS — E7800 Pure hypercholesterolemia, unspecified: Secondary | ICD-10-CM

## 2021-06-23 ENCOUNTER — Other Ambulatory Visit: Payer: Self-pay | Admitting: Cardiovascular Disease

## 2021-06-23 DIAGNOSIS — E7800 Pure hypercholesterolemia, unspecified: Secondary | ICD-10-CM

## 2021-07-10 ENCOUNTER — Other Ambulatory Visit: Payer: Self-pay

## 2021-07-10 ENCOUNTER — Ambulatory Visit (INDEPENDENT_AMBULATORY_CARE_PROVIDER_SITE_OTHER): Payer: Federal, State, Local not specified - PPO | Admitting: Internal Medicine

## 2021-07-10 ENCOUNTER — Encounter: Payer: Self-pay | Admitting: Internal Medicine

## 2021-07-10 VITALS — BP 105/65 | HR 69 | Temp 98.7°F | Ht 75.0 in | Wt 146.0 lb

## 2021-07-10 DIAGNOSIS — Z1159 Encounter for screening for other viral diseases: Secondary | ICD-10-CM | POA: Diagnosis not present

## 2021-07-10 DIAGNOSIS — E782 Mixed hyperlipidemia: Secondary | ICD-10-CM | POA: Diagnosis not present

## 2021-07-10 DIAGNOSIS — Q8740 Marfan's syndrome, unspecified: Secondary | ICD-10-CM | POA: Diagnosis not present

## 2021-07-10 DIAGNOSIS — Z23 Encounter for immunization: Secondary | ICD-10-CM

## 2021-07-10 DIAGNOSIS — Z114 Encounter for screening for human immunodeficiency virus [HIV]: Secondary | ICD-10-CM

## 2021-07-10 DIAGNOSIS — N486 Induration penis plastica: Secondary | ICD-10-CM

## 2021-07-10 DIAGNOSIS — M542 Cervicalgia: Secondary | ICD-10-CM

## 2021-07-10 DIAGNOSIS — R636 Underweight: Secondary | ICD-10-CM

## 2021-07-10 DIAGNOSIS — Z1211 Encounter for screening for malignant neoplasm of colon: Secondary | ICD-10-CM

## 2021-07-10 DIAGNOSIS — Z0001 Encounter for general adult medical examination with abnormal findings: Secondary | ICD-10-CM

## 2021-07-10 MED ORDER — CYCLOBENZAPRINE HCL 10 MG PO TABS: 10.0000 mg | ORAL_TABLET | Freq: Every evening | ORAL | 0 refills | Status: DC | PRN

## 2021-07-10 NOTE — Progress Notes (Signed)
Subjective:    Patient ID: Christopher Ball, male    DOB: [birth year], 50 y.o.   MRN: [id number]  HPI  Pt presents to the clinic today for his annual exam. He is also due to follow up chronic conditions.  Marfan's Syndrome: He denies any issues at this time.  He is not following with a geneticist at this time.  HLD: His last LDL was 105, triglycerides 57, 08/2019. He denies myalgias on Atorvastatin as prescribed. He does not consume a low fat diet.  Peyronie's Disease: He reports he had an MRI 11/2020 and has not heard any results of this.  He is following with [facility] but is not very happy with their care due to lack of communication.  He also reports left-sided neck pain.  This started 1.5-2 weeks ago.  He describes the pain as sore and achy.  The pain does not radiate into his left arm.  He denies numbness, tingling or weakness of the left upper extremity.  He has not taken any medications OTC for this.  No meds.  Flu: never Tetanus: 05/2015 Covid: Pfizer x 2 Colon Screening: never Vision Screening: annually Dentist: biannually  Diet: He does eat meat. He consumes fruits and veggies. He does eat fried foods. He drinks mostly coffee, water and iced tea. Exercise: Walking  Review of Systems     Past Medical History:  Diagnosis Date   Marfan syndrome    Stomach problems    Thoracic aortic aneurysm (HCC)    replacement by Dr. Valentina Lucks /30/07 with aortic bioprosthetic valve   Tobacco abuse     Current Outpatient Medications  Medication Sig Dispense Refill   atorvastatin (LIPITOR) 20 MG tablet Take 1 tablet (20 mg total) by mouth daily. PATIENT NEEDS TO SCHEDULE AN OFFICE VISIT FOR FUTURE REFILLS. 15 tablet 0   clindamycin (CLEOCIN) 150 MG capsule Clindamycin 150 mg capsule = take 4 capsules (600 mg) 1 hour prior to dental procedure 4 capsule 0   No current facility-administered medications for this visit.    Allergies  Allergen Reactions   Augmentin [Amoxicillin-Pot  Clavulanate] Shortness Of Breath and Nausea And Vomiting    Family History  Problem Relation Age of Onset   Asthma Mother    Early death Father    Heart disease Father    Asthma Brother    Diabetes Maternal Grandmother    Cancer Neg Hx    Stroke Neg Hx    Kidney disease Neg Hx    Hypertension Neg Hx    Hyperlipidemia Neg Hx     Social History   Socioeconomic History   Marital status: Married    Spouse name: Not on file   Number of children: Not on file   Years of education: Not on file   Highest education level: Not on file  Occupational History   Not on file  Tobacco Use   Smoking status: Every Day    Packs/day: 1.00    Years: 20.00    Pack years: 20.00    Types: Cigars, Cigarettes   Smokeless tobacco: Never  Substance and Sexual Activity   Alcohol use: Yes    Alcohol/week: 1.0 standard drink    Types: 1 Shots of liquor per week    Comment: rare   Drug use: No   Sexual activity: Yes  Other Topics Concern   Not on file  Social History Narrative   Not on file   Social Determinants of Health   Financial  Resource Strain: Not on file  Food Insecurity: Not on file  Transportation Needs: Not on file  Physical Activity: Not on file  Stress: Not on file  Social Connections: Not on file  Intimate Partner Violence: Not on file     Constitutional: Denies fever, malaise, fatigue, headache or abrupt weight changes.  HEENT: Denies eye pain, eye redness, ear pain, ringing in the ears, wax buildup, runny nose, nasal congestion, bloody nose, or sore throat. Respiratory: Denies difficulty breathing, shortness of breath, cough or sputum production.   Cardiovascular: Denies chest pain, chest tightness, palpitations or swelling in the hands or feet.  Gastrointestinal: Denies abdominal pain, bloating, constipation, diarrhea or blood in the stool.  GU: Patient reports Peyronie's disease.  Denies urgency, frequency, pain with urination, burning sensation, blood in urine, odor  or discharge. Musculoskeletal: Patient reports left-sided neck pain.  Denies decrease in range of motion, difficulty with gait, or joint pain and swelling.  Skin: Denies redness, rashes, lesions or ulcercations.  Neurological: Denies dizziness, difficulty with memory, difficulty with speech or problems with balance and coordination.  Psych: Denies anxiety, depression, SI/HI.  No other specific complaints in a complete review of systems (except as listed in HPI above).  Objective:   Physical Exam BP 105/65 (BP Location: Right Arm, Patient Position: Sitting, Cuff Size: Normal)   Pulse 69   Temp 98.7 F (37.1 C) (Temporal)   Ht 6\' 3"  (1.905 m)   Wt 146 lb (66.2 kg)   SpO2 99%   BMI 18.25 kg/m   Wt Readings from Last 3 Encounters:  10/26/20 149 lb (67.6 kg)  10/16/20 149 lb (67.6 kg)  04/25/20 150 lb (68 kg)    General: Appears his stated age, well developed, well nourished in NAD. Skin: Warm, dry and intact.  HEENT: Head: normal shape and size; Eyes: EOMs intact;  Neck:  Neck supple, trachea midline. No masses, lumps or thyromegaly present.  Cardiovascular: Normal rate and rhythm. S1,S2 noted.  No murmur, rubs or gallops noted. No JVD or BLE edema.  Pulmonary/Chest: Normal effort and positive vesicular breath sounds. No respiratory distress. No wheezes, rales or ronchi noted.  Abdomen: Soft and nontender. Normal bowel sounds. No distention or masses noted. Liver, spleen and kidneys non palpable. Musculoskeletal: Normal flexion, extension, rotation to the right and lateral bending to the right.  Decreased rotation to the left and lateral bending to the left.  No bony tenderness noted over the cervical spine.  Shoulder shrug equal.  Strength 5/5 BUE/BLE.  No difficulty with gait.  Neurological: Alert and oriented. Cranial nerves II-XII grossly intact. Coordination normal.  Psychiatric: Mood and affect normal. Behavior is normal. Judgment and thought content normal.     BMET     Component Value Date/Time   NA 142 06/23/2017 0917   K 4.9 06/23/2017 0917   CL 107 (H) 06/23/2017 0917   CO2 23 06/23/2017 0917   GLUCOSE 94 06/23/2017 0917   GLUCOSE 89 08/05/2014 1510   BUN 20 06/23/2017 0917   CREATININE 1.27 06/23/2017 0917   CALCIUM 9.4 06/23/2017 0917   GFRNONAA 68 06/23/2017 0917   GFRAA 78 06/23/2017 0917    Lipid Panel     Component Value Date/Time   CHOL 166 08/24/2019 0942   TRIG 57 08/24/2019 0942   HDL 50 08/24/2019 0942   CHOLHDL 3.3 08/24/2019 0942   LDLCALC 105 (H) 08/24/2019 0942    CBC    Component Value Date/Time   WBC 8.3 06/23/2017 0917  Subjective:    Patient ID: Christopher Ball, male    DOB: [birth year], 50 y.o.   MRN: [id number]  HPI  Pt presents to the clinic today for his annual exam. He is also due to follow up chronic conditions.  Marfan's Syndrome: He denies any issues at this time.  He is not following with a geneticist at this time.  HLD: His last LDL was 105, triglycerides 57, 08/2019. He denies myalgias on Atorvastatin as prescribed. He does not consume a low fat diet.  Peyronie's Disease: He reports he had an MRI 11/2020 and has not heard any results of this.  He is following with [facility] urology but is not very happy with their care due to lack of communication.  He also reports left-sided neck pain.  This started 1.5-2 weeks ago.  He describes the pain as sore and achy.  The pain does not radiate into his left arm.  He denies numbness, tingling or weakness of the left upper extremity.  He has not taken any medications OTC for this.  No meds.  Flu: never Tetanus: 05/2015 Covid: Pfizer x 2 Colon Screening: never Vision Screening: annually Dentist: biannually  Diet: He does eat meat. He consumes fruits and veggies. He does eat fried foods. He drinks mostly coffee, water and iced tea. Exercise: Walking  Review of Systems     Past Medical History:  Diagnosis Date   Marfan syndrome    Stomach problems    Thoracic aortic aneurysm (HCC)    replacement by Dr. Valentina Lucks /30/07 with aortic bioprosthetic valve   Tobacco abuse     Current Outpatient Medications  Medication Sig Dispense Refill   atorvastatin (LIPITOR) 20 MG tablet Take 1 tablet (20 mg total) by mouth daily. PATIENT NEEDS TO SCHEDULE AN OFFICE VISIT FOR FUTURE REFILLS. 15 tablet 0   clindamycin (CLEOCIN) 150 MG capsule Clindamycin 150 mg capsule = take 4 capsules (600 mg) 1 hour prior to dental procedure 4 capsule 0   No current facility-administered medications for this visit.    Allergies  Allergen Reactions   Augmentin [Amoxicillin-Pot  Clavulanate] Shortness Of Breath and Nausea And Vomiting    Family History  Problem Relation Age of Onset   Asthma Mother    Early death Father    Heart disease Father    Asthma Brother    Diabetes Maternal Grandmother    Cancer Neg Hx    Stroke Neg Hx    Kidney disease Neg Hx    Hypertension Neg Hx    Hyperlipidemia Neg Hx     Social History   Socioeconomic History   Marital status: Married    Spouse name: Not on file   Number of children: Not on file   Years of education: Not on file   Highest education level: Not on file  Occupational History   Not on file  Tobacco Use   Smoking status: Every Day    Packs/day: 1.00    Years: 20.00    Pack years: 20.00    Types: Cigars, Cigarettes   Smokeless tobacco: Never  Substance and Sexual Activity   Alcohol use: Yes    Alcohol/week: 1.0 standard drink    Types: 1 Shots of liquor per week    Comment: rare   Drug use: No   Sexual activity: Yes  Other Topics Concern   Not on file  Social History Narrative   Not on file   Social Determinants of Health   Financial

## 2021-07-11 DIAGNOSIS — R636 Underweight: Secondary | ICD-10-CM | POA: Insufficient documentation

## 2021-07-11 DIAGNOSIS — N486 Induration penis plastica: Secondary | ICD-10-CM | POA: Insufficient documentation

## 2021-07-11 LAB — COMPLETE METABOLIC PANEL WITH GFR
AG Ratio: 1.8 (calc) (ref 1.0–2.5)
ALT: 27 U/L (ref 9–46)
AST: 24 U/L (ref 10–40)
Albumin: 4.6 g/dL (ref 3.6–5.1)
Alkaline phosphatase (APISO): 61 U/L (ref 36–130)
BUN: 19 mg/dL (ref 7–25)
CO2: 26 mmol/L (ref 20–32)
Calcium: 9.7 mg/dL (ref 8.6–10.3)
Chloride: 106 mmol/L (ref 98–110)
Creat: 1.09 mg/dL (ref 0.60–1.29)
Globulin: 2.6 g/dL (calc) (ref 1.9–3.7)
Glucose, Bld: 84 mg/dL (ref 65–139)
Potassium: 4.5 mmol/L (ref 3.5–5.3)
Sodium: 141 mmol/L (ref 135–146)
Total Bilirubin: 0.7 mg/dL (ref 0.2–1.2)
Total Protein: 7.2 g/dL (ref 6.1–8.1)
eGFR: 83 mL/min/{1.73_m2} (ref >=60)

## 2021-07-11 LAB — LIPID PANEL
Cholesterol: 152 mg/dL (ref <200)
HDL: 45 mg/dL (ref >=40)
LDL Cholesterol (Calc): 90 mg/dL (calc)
Non-HDL Cholesterol (Calc): 107 mg/dL (calc) (ref <130)
Total CHOL/HDL Ratio: 3.4 (calc) (ref <5.0)
Triglycerides: 79 mg/dL (ref <150)

## 2021-07-11 LAB — CBC
HCT: 41.8 % (ref 38.5–50.0)
Hemoglobin: 14.5 g/dL (ref 13.2–17.1)
MCH: 33.6 pg — ABNORMAL HIGH (ref 27.0–33.0)
MCHC: 34.7 g/dL (ref 32.0–36.0)
MCV: 96.8 fL (ref 80.0–100.0)
MPV: 12.2 fL (ref 7.5–12.5)
Platelets: 204 10*3/uL (ref 140–400)
RBC: 4.32 10*6/uL (ref 4.20–5.80)
RDW: 12.3 % (ref 11.0–15.0)
WBC: 10.1 10*3/uL (ref 3.8–10.8)

## 2021-07-11 LAB — HEPATITIS C ANTIBODY
Hepatitis C Ab: NONREACTIVE
SIGNAL TO CUT-OFF: 0.06 (ref <1.00)

## 2021-07-11 LAB — HIV ANTIBODY (ROUTINE TESTING W REFLEX): HIV 1&2 Ab, 4th Generation: NONREACTIVE

## 2021-07-11 NOTE — Patient Instructions (Signed)
Health Maintenance, Male Adopting a healthy lifestyle and getting preventive care are important in promoting health and wellness. Ask your health care provider about: The right schedule for you to have regular tests and exams. Things you can do on your own to prevent diseases and keep yourself healthy. What should I know about diet, weight, and exercise? Eat a healthy diet  Eat a diet that includes plenty of vegetables, fruits, low-fat dairy products, and lean protein. Do not eat a lot of foods that are high in solid fats, added sugars, or sodium. Maintain a healthy weight Body mass index (BMI) is a measurement that can be used to identify possible weight problems. It estimates body fat based on height and weight. Your health care provider can help determine your BMI and help you achieve or maintain a healthy weight. Get regular exercise Get regular exercise. This is one of the most important things you can do for your health. Most adults should: Exercise for at least 150 minutes each week. The exercise should increase your heart rate and make you sweat (moderate-intensity exercise). Do strengthening exercises at least twice a week. This is in addition to the moderate-intensity exercise. Spend less time sitting. Even light physical activity can be beneficial. Watch cholesterol and blood lipids Have your blood tested for lipids and cholesterol at 49 years of age, then have this test every 5 years. You may need to have your cholesterol levels checked more often if: Your lipid or cholesterol levels are high. You are older than 49 years of age. You are at high risk for heart disease. What should I know about cancer screening? Many types of cancers can be detected early and may often be prevented. Depending on your health history and family history, you may need to have cancer screening at various ages. This may include screening for: Colorectal cancer. Prostate cancer. Skin cancer. Lung  cancer. What should I know about heart disease, diabetes, and high blood pressure? Blood pressure and heart disease High blood pressure causes heart disease and increases the risk of stroke. This is more likely to develop in people who have high blood pressure readings, are of African descent, or are overweight. Talk with your health care provider about your target blood pressure readings. Have your blood pressure checked: Every 3-5 years if you are 18-39 years of age. Every year if you are 40 years old or older. If you are between the ages of 65 and 75 and are a current or former smoker, ask your health care provider if you should have a one-time screening for abdominal aortic aneurysm (AAA). Diabetes Have regular diabetes screenings. This checks your fasting blood sugar level. Have the screening done: Once every three years after age 45 if you are at a normal weight and have a low risk for diabetes. More often and at a younger age if you are overweight or have a high risk for diabetes. What should I know about preventing infection? Hepatitis B If you have a higher risk for hepatitis B, you should be screened for this virus. Talk with your health care provider to find out if you are at risk for hepatitis B infection. Hepatitis C Blood testing is recommended for: Everyone born from 1945 through 1965. Anyone with known risk factors for hepatitis C. Sexually transmitted infections (STIs) You should be screened each year for STIs, including gonorrhea and chlamydia, if: You are sexually active and are younger than 49 years of age. You are older than 49 years   of age and your health care provider tells you that you are at risk for this type of infection. Your sexual activity has changed since you were last screened, and you are at increased risk for chlamydia or gonorrhea. Ask your health care provider if you are at risk. Ask your health care provider about whether you are at high risk for HIV.  Your health care provider may recommend a prescription medicine to help prevent HIV infection. If you choose to take medicine to prevent HIV, you should first get tested for HIV. You should then be tested every 3 months for as long as you are taking the medicine. Follow these instructions at home: Lifestyle Do not use any products that contain nicotine or tobacco, such as cigarettes, e-cigarettes, and chewing tobacco. If you need help quitting, ask your health care provider. Do not use street drugs. Do not share needles. Ask your health care provider for help if you need support or information about quitting drugs. Alcohol use Do not drink alcohol if your health care provider tells you not to drink. If you drink alcohol: Limit how much you have to 0-2 drinks a day. Be aware of how much alcohol is in your drink. In the [place] one drink equals one 12 oz bottle of beer (355 mL), one 5 oz glass of wine (148 mL), or one 1 oz glass of hard liquor (44 mL). General instructions Schedule regular health, dental, and eye exams. Stay current with your vaccines. Tell your health care provider if: You often feel depressed. You have ever been abused or do not feel safe at home. Summary Adopting a healthy lifestyle and getting preventive care are important in promoting health and wellness. Follow your health care provider's instructions about healthy diet, exercising, and getting tested or screened for diseases. Follow your health care provider's instructions on monitoring your cholesterol and blood pressure. This information is not intended to replace advice given to you by your health care provider. Make sure you discuss any questions you have with your health care provider. Document Revised: 11/10/2020 Document Reviewed: 08/26/2018 Elsevier Patient Education  2022 Elsevier Inc.  

## 2021-07-11 NOTE — Assessment & Plan Note (Signed)
Currently no issues Not following with genetics

## 2021-07-11 NOTE — Assessment & Plan Note (Signed)
Secondary to Marfan's Encourage high-protein, high-calorie diet

## 2021-07-11 NOTE — Assessment & Plan Note (Signed)
C-Met and lipid profile today Encouraged him to consume a low-fat diet Continue Atorvastatin, will adjust if needed based on labs

## 2021-07-11 NOTE — Assessment & Plan Note (Signed)
Advised his wife to call [facility] urology and request a follow-up

## 2021-07-17 ENCOUNTER — Other Ambulatory Visit: Payer: Self-pay

## 2021-07-17 DIAGNOSIS — E7800 Pure hypercholesterolemia, unspecified: Secondary | ICD-10-CM

## 2021-07-17 MED ORDER — ATORVASTATIN CALCIUM 20 MG PO TABS: 20.0000 mg | ORAL_TABLET | Freq: Every day | ORAL | 0 refills | Status: DC

## 2021-07-17 MED ORDER — CLINDAMYCIN HCL 150 MG PO CAPS: ORAL_CAPSULE | ORAL | 0 refills | Status: DC

## 2021-07-17 NOTE — Telephone Encounter (Signed)
Spoke to patient. He is aware he will need an office visit.  Patient schedule appointment. Patient is schedule for appointment one refill of both medications.

## 2021-08-14 ENCOUNTER — Other Ambulatory Visit: Payer: Self-pay

## 2021-08-14 ENCOUNTER — Ambulatory Visit: Payer: Federal, State, Local not specified - PPO | Admitting: Cardiovascular Disease

## 2021-08-14 ENCOUNTER — Encounter: Payer: Self-pay | Admitting: Cardiovascular Disease

## 2021-08-14 VITALS — BP 130/68 | HR 60 | Ht 78.0 in | Wt 148.0 lb

## 2021-08-14 DIAGNOSIS — Z952 Presence of prosthetic heart valve: Secondary | ICD-10-CM | POA: Diagnosis not present

## 2021-08-14 DIAGNOSIS — Q8740 Marfan's syndrome, unspecified: Secondary | ICD-10-CM

## 2021-08-14 DIAGNOSIS — E782 Mixed hyperlipidemia: Secondary | ICD-10-CM

## 2021-08-14 DIAGNOSIS — I8393 Asymptomatic varicose veins of bilateral lower extremities: Secondary | ICD-10-CM

## 2021-08-14 NOTE — Progress Notes (Signed)
08/14/2021 Christopher Ball   09-21-1970  [id number]  Primary Physician Sampson Si Salvadore Oxford, NP Primary Cardiologist: [name] MD Milagros Loll, [place], [place]  HPI:  Christopher Ball is a 50 y.o.  tall and thin appearing married Caucasian male father of 2 children who does not work.  I last saw him in the office 08/24/2019. He has a history of Marfan syndrome and underwent cardiac catheterization by myself 12/04/05 revealing normal coronary arteries and normal LV function. He ultimately underwent thoracic aortic aneurysm resection and grafting on 01/13/06 by Dr. [name] with aortic valve replacement using a bioprosthesis and coronary artery reimplantation. As of the problems include continued tobacco abuse 1 pack per day.    Since I saw him a year ago he is remained stable.  He denies chest pain or shortness of breath.  He had increased his smoking from 1 pack a day up to 2 packs/day because of "stress" related to property that he owns.  He is now back down to 1 pack/day.  His most recent 2D echo performed 07/05/2020 revealed normal LV size and function, stable aortic root, and a well-functioning aortic bioprosthesis.  Since I saw him 2 years ago he has remained stable.  He does complain of some varicosities in his legs.  He does continue to smoke 1 pack/day.  He denies chest pain or shortness of breath.   Current Meds  Medication Sig   atorvastatin (LIPITOR) 20 MG tablet Take 1 tablet (20 mg total) by mouth daily. KEEP OFFICE VISIT FOR FUTURE REFILLS.   clindamycin (CLEOCIN) 150 MG capsule Clindamycin 150 mg capsule = take 4 capsules (600 mg) 1 hour prior to dental procedure   cyclobenzaprine (FLEXERIL) 10 MG tablet Take 1 tablet (10 mg total) by mouth at bedtime as needed for muscle spasms.     Allergies  Allergen Reactions   Augmentin [Amoxicillin-Pot Clavulanate] Shortness Of Breath and Nausea And Vomiting    Social History   Socioeconomic History   Marital status: Married     Spouse name: Not on file   Number of children: Not on file   Years of education: Not on file   Highest education level: Not on file  Occupational History   Not on file  Tobacco Use   Smoking status: Every Day    Packs/day: 1.00    Years: 20.00    Pack years: 20.00    Types: Cigars, Cigarettes   Smokeless tobacco: Never  Substance and Sexual Activity   Alcohol use: Yes    Alcohol/week: 1.0 standard drink    Types: 1 Shots of liquor per week    Comment: rare   Drug use: No   Sexual activity: Yes  Other Topics Concern   Not on file  Social History Narrative   Not on file   Social Determinants of Health   Financial Resource Strain: Not on file  Food Insecurity: Not on file  Transportation Needs: Not on file  Physical Activity: Not on file  Stress: Not on file  Social Connections: Not on file  Intimate Partner Violence: Not on file     Review of Systems: General: negative for chills, fever, night sweats or weight changes.  Cardiovascular: negative for chest pain, dyspnea on exertion, edema, orthopnea, palpitations, paroxysmal nocturnal dyspnea or shortness of breath Dermatological: negative for rash Respiratory: negative for cough or wheezing Urologic: negative for hematuria Abdominal: negative for nausea, vomiting, diarrhea, bright red blood per rectum, melena, or hematemesis Neurologic:  negative for visual changes, syncope, or dizziness All other systems reviewed and are otherwise negative except as noted above.    Blood pressure (!) 158/82, pulse 60, height 6\' 6"  (1.981 m), weight 148 lb (67.1 kg), SpO2 99 %.  General appearance: alert and no distress Neck: no adenopathy, no carotid bruit, no JVD, supple, symmetrical, trachea midline, and thyroid not enlarged, symmetric, no tenderness/mass/nodules Lungs: clear to auscultation bilaterally Heart: regular rate and rhythm, S1, S2 normal, no murmur, click, rub or gallop Extremities: extremities normal, atraumatic, no  cyanosis or edema Pulses: 2+ and symmetric Skin: Skin color, texture, turgor normal. No rashes or lesions Neurologic: Grossly normal  EKG sinus rhythm at 60 with voltage criteria for LVH and early repolarization pattern.  I personally reviewed this EKG.  ASSESSMENT AND PLAN:   Marfan syndrome History of Marfan's disease status post aortic root replacement and aortic valve replacement with a bioprosthesis by Dr. [name] 01/13/2006.  He had normal coronaries at cath and had coronary artery reimplantation.  His most recent 2D echo performed 07/13/2020 revealed normal LV systolic function with a well-functioning aortic bioprosthesis and stable thoracic aortic graft.  We will continue to follow this on annual basis.  Hyperlipidemia History of hyperlipidemia on low-dose statin therapy with lipid profile performed since 07/10/2021 revealing total cholesterol 152, LDL of 90 and HDL 45.     [name] MD FACP,FACC,FAHA, [facility] 08/14/2021 11:02 AM

## 2021-08-14 NOTE — Assessment & Plan Note (Signed)
History of Marfan's disease status post aortic root replacement and aortic valve replacement with a bioprosthesis by Dr. Servando Snare 01/13/2006.  He had normal coronaries at cath and had coronary artery reimplantation.  His most recent 2D echo performed 07/13/2020 revealed normal LV systolic function with a well-functioning aortic bioprosthesis and stable thoracic aortic graft.  We will continue to follow this on annual basis.

## 2021-08-14 NOTE — Patient Instructions (Signed)
Medication Instructions:  Your physician recommends that you continue on your current medications as directed. Please refer to the Current Medication list given to you today.  *If you need a refill on your cardiac medications before your next appointment, please call your pharmacy*   Testing/Procedures: Your physician has requested that you have an echocardiogram. Echocardiography is a painless test that uses sound waves to create images of your heart. It provides your doctor with information about the size and shape of your heart and how well your heart's chambers and valves are working. This procedure takes approximately one hour. There are no restrictions for this procedure. To be done in Oct/Nov 2023. This procedure is done at [address].    Follow-Up: At [facility], you and your health needs are our priority.  As part of our continuing mission to provide you with exceptional heart care, we have created designated Provider Care Teams.  These Care Teams include your primary Cardiologist (physician) and Advanced Practice Providers (APPs -  Physician Assistants and Nurse Practitioners) who all work together to provide you with the care you need, when you need it.  We recommend signing up for the patient portal called "MyChart".  Sign up information is provided on this After Visit Summary.  MyChart is used to connect with patients for Virtual Visits (Telemedicine).  Patients are able to view lab/test results, encounter notes, upcoming appointments, etc.  Non-urgent messages can be sent to your provider as well.   To learn more about what you can do with MyChart, go to [email or web address].    Your next appointment:   12 month(s)  The format for your next appointment:   In Person  Provider:   Quay Burow, MD

## 2021-08-14 NOTE — Assessment & Plan Note (Signed)
History of hyperlipidemia on low-dose statin therapy with lipid profile performed since 07/10/2021 revealing total cholesterol 152, LDL of 90 and HDL 45.

## 2021-08-18 ENCOUNTER — Other Ambulatory Visit: Payer: Self-pay | Admitting: Cardiovascular Disease

## 2021-08-18 DIAGNOSIS — E7800 Pure hypercholesterolemia, unspecified: Secondary | ICD-10-CM

## 2021-08-20 ENCOUNTER — Other Ambulatory Visit: Payer: Self-pay | Admitting: Cardiovascular Disease

## 2021-08-21 ENCOUNTER — Encounter (INDEPENDENT_AMBULATORY_CARE_PROVIDER_SITE_OTHER): Payer: Federal, State, Local not specified - PPO | Admitting: Ophthalmology

## 2021-08-21 ENCOUNTER — Other Ambulatory Visit: Payer: Self-pay

## 2021-08-21 DIAGNOSIS — H35413 Lattice degeneration of retina, bilateral: Secondary | ICD-10-CM

## 2021-08-21 DIAGNOSIS — Q8740 Marfan's syndrome, unspecified: Secondary | ICD-10-CM | POA: Diagnosis not present

## 2021-08-21 DIAGNOSIS — H33303 Unspecified retinal break, bilateral: Secondary | ICD-10-CM

## 2021-08-21 DIAGNOSIS — D3132 Benign neoplasm of left choroid: Secondary | ICD-10-CM | POA: Diagnosis not present

## 2021-08-21 DIAGNOSIS — H25813 Combined forms of age-related cataract, bilateral: Secondary | ICD-10-CM | POA: Diagnosis not present

## 2021-08-21 DIAGNOSIS — H4423 Degenerative myopia, bilateral: Secondary | ICD-10-CM

## 2021-08-21 DIAGNOSIS — H2513 Age-related nuclear cataract, bilateral: Secondary | ICD-10-CM

## 2021-08-21 DIAGNOSIS — H43813 Vitreous degeneration, bilateral: Secondary | ICD-10-CM

## 2021-08-21 NOTE — Telephone Encounter (Signed)
This is Dr. Berry's pt 

## 2021-08-28 ENCOUNTER — Encounter (INDEPENDENT_AMBULATORY_CARE_PROVIDER_SITE_OTHER): Payer: Federal, State, Local not specified - PPO | Admitting: Ophthalmology

## 2021-08-28 ENCOUNTER — Other Ambulatory Visit: Payer: Self-pay

## 2021-08-28 DIAGNOSIS — H33302 Unspecified retinal break, left eye: Secondary | ICD-10-CM | POA: Diagnosis not present

## 2021-09-04 ENCOUNTER — Ambulatory Visit (HOSPITAL_COMMUNITY): Payer: Federal, State, Local not specified - PPO | Attending: Cardiovascular Disease

## 2021-09-04 ENCOUNTER — Other Ambulatory Visit: Payer: Self-pay

## 2021-09-04 DIAGNOSIS — Q8740 Marfan's syndrome, unspecified: Secondary | ICD-10-CM | POA: Diagnosis not present

## 2021-09-04 DIAGNOSIS — E782 Mixed hyperlipidemia: Secondary | ICD-10-CM | POA: Diagnosis not present

## 2021-09-04 DIAGNOSIS — Z952 Presence of prosthetic heart valve: Secondary | ICD-10-CM | POA: Diagnosis not present

## 2021-09-04 LAB — ECHOCARDIOGRAM COMPLETE
AR max vel: 1.14 cm2
AV Area VTI: 1.38 cm2
AV Area mean vel: 1.13 cm2
AV Mean grad: 11 mmHg
AV Peak grad: 19.2 mmHg
Ao pk vel: 2.19 m/s
Area-P 1/2: 3.1 cm2
S' Lateral: 2.9 cm

## 2021-09-13 ENCOUNTER — Other Ambulatory Visit: Payer: Self-pay | Admitting: *Deleted

## 2021-09-13 DIAGNOSIS — I83893 Varicose veins of bilateral lower extremities with other complications: Secondary | ICD-10-CM

## 2021-09-19 ENCOUNTER — Encounter (INDEPENDENT_AMBULATORY_CARE_PROVIDER_SITE_OTHER): Payer: Federal, State, Local not specified - PPO | Admitting: Ophthalmology

## 2021-09-19 ENCOUNTER — Other Ambulatory Visit: Payer: Self-pay

## 2021-09-19 DIAGNOSIS — H33302 Unspecified retinal break, left eye: Secondary | ICD-10-CM

## 2021-09-22 ENCOUNTER — Ambulatory Visit
Admission: EM | Admit: 2021-09-22 | Discharge: 2021-09-22 | Disposition: A | Discharge status: Home / Self Care | Payer: Federal, State, Local not specified - PPO | Attending: Physician Assistant | Admitting: Physician Assistant

## 2021-09-22 ENCOUNTER — Other Ambulatory Visit: Payer: Self-pay

## 2021-09-22 ENCOUNTER — Ambulatory Visit (INDEPENDENT_AMBULATORY_CARE_PROVIDER_SITE_OTHER): Payer: Federal, State, Local not specified - PPO

## 2021-09-22 ENCOUNTER — Encounter: Payer: Self-pay | Admitting: Emergency Medicine

## 2021-09-22 DIAGNOSIS — S51811A Laceration without foreign body of right forearm, initial encounter: Secondary | ICD-10-CM | POA: Diagnosis not present

## 2021-09-22 DIAGNOSIS — S51851A Open bite of right forearm, initial encounter: Secondary | ICD-10-CM

## 2021-09-22 DIAGNOSIS — W540XXA Bitten by dog, initial encounter: Secondary | ICD-10-CM

## 2021-09-22 MED ORDER — DOXYCYCLINE HYCLATE 100 MG PO CAPS: 100.0000 mg | ORAL_CAPSULE | Freq: Two times a day (BID) | ORAL | 0 refills | Status: DC

## 2021-09-22 MED ORDER — KETOROLAC TROMETHAMINE 60 MG/2ML IM SOLN
60.0000 mg | Freq: Once | INTRAMUSCULAR | Status: AC
  Administered 2021-09-22: 60 mg via INTRAMUSCULAR

## 2021-09-22 NOTE — ED Triage Notes (Signed)
Pt here for dog bite to right arm from known dog that is up to date on rabies today; pt with puncture to right forearm and bleeding controlled

## 2021-09-22 NOTE — ED Provider Notes (Addendum)
[facility]    CSN: [id number] Arrival date & time: 09/22/21  1318      History   Chief Complaint Chief Complaint  Patient presents with   Animal Bite    HPI Christopher Ball is a 51 y.o. male.   Patient here c/w "dog bite" x R forearm x today . He was bitten by  a friends lab.  Admits pain, tenderness, n/t of digits 4/5, RROM of fingers, pain with extension.  He reports he has tetanus booster 3 months ago.   Past Medical History:  Diagnosis Date   Marfan syndrome    Stomach problems    Thoracic aortic aneurysm    replacement by Dr. 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 Authorizing Provider  doxycycline (VIBRAMYCIN) 100 MG capsule Take 1 capsule (100 mg total) by mouth 2 (two) times daily. 09/22/21  Yes Peri Jefferson, PA-C  atorvastatin (LIPITOR) 20 MG tablet TAKE 1 TABLET (20 MG TOTAL) BY MOUTH DAILY. KEEP OFFICE VISIT FOR FUTURE REFILLS. 08/20/21   Lorretta Harp, MD  clindamycin (CLEOCIN) 150 MG capsule Clindamycin 150 mg capsule = take 4 capsules (600 mg) 1 hour prior to dental procedure 07/17/21   Lorretta Harp, MD  cyclobenzaprine (FLEXERIL) 10 MG tablet Take 1 tablet (10 mg total) by mouth at bedtime as needed for muscle spasms. 07/10/21   Jearld Fenton, NP    Family History Family History  Problem Relation Age of Onset   Asthma Mother    Early death Father    Heart disease Father    Asthma Brother    Diabetes Maternal Grandmother    Cancer Neg Hx    Stroke Neg Hx    Kidney disease Neg Hx    Hypertension Neg Hx     Hyperlipidemia Neg Hx     Social History Social History   Tobacco Use   Smoking status: Every Day    Packs/day: 1.00    Years: 20.00    Pack years: 20.00    Types: Cigars, Cigarettes   Smokeless tobacco: Never  Substance Use Topics   Alcohol use: Yes    Alcohol/week: 1.0 standard drink    Types: 1 Shots of liquor per week    Comment: rare   Drug use: No     Allergies   Augmentin [amoxicillin-pot clavulanate]   Review of Systems Review of Systems  Musculoskeletal:  Positive for arthralgias and myalgias. Negative for gait problem and joint swelling.  Skin:  Positive for wound. Negative for color change.  Neurological:  Positive for weakness and numbness.    Physical Exam Triage Vital Signs ED Triage Vitals [09/22/21 1408]  Enc Vitals Group     BP (!) 163/72     Pulse Rate 77     Resp 18     Temp 98.2 F (36.8 C)     Temp Source Oral     SpO2 98 %     Weight      Height  Head Circumference      Peak Flow      Pain Score 7     Pain Loc      Pain Edu?      Excl. in [place]?    No data found.  Updated Vital Signs BP (!) 163/72 (BP Location: Left Arm)    Pulse 77    Temp 98.2 F (36.8 C) (Oral)    Resp 18    SpO2 98%   Visual Acuity Right Eye Distance:   Left Eye Distance:   Bilateral Distance:    Right Eye Near:   Left Eye Near:    Bilateral Near:     Physical Exam Vitals and nursing note reviewed.  Constitutional:      General: He is not in acute distress.    Appearance: Normal appearance. He is not ill-appearing.  HENT:     Head: Normocephalic and atraumatic.  Eyes:     General: No scleral icterus.    Extraocular Movements: Extraocular movements intact.     Conjunctiva/sclera: Conjunctivae normal.  Pulmonary:     Effort: Pulmonary effort is normal. No respiratory distress.  Musculoskeletal:     Right forearm: Laceration, tenderness and bony tenderness present. No swelling.     Cervical back: Normal range of motion. No rigidity.      Comments: 3 lacerations R forearm,   #1 largest wedge shaped 2 cm x 2 cm, 4 mm deep, through facial layer dorsum R mid forearm  #2 1 cm skin tear, 1 - 2 mm deep  #3 8 mm skin tear, 1 mm deep    Skin:    General: Skin is warm.     Findings: No rash.  Neurological:     General: No focal deficit present.     Mental Status: He is alert and oriented to person, place, and time.     Motor: No weakness.     Gait: Gait normal.  Psychiatric:        Mood and Affect: Mood normal.        Behavior: Behavior normal.     UC Treatments / Results  Labs (all labs ordered are listed, but only abnormal results are displayed) Labs Reviewed - No data to display  EKG   Radiology DG Forearm Right  Result Date: 09/22/2021 CLINICAL DATA:  Dog bite EXAM: RIGHT FOREARM - 2 VIEW COMPARISON:  None. FINDINGS: There is no evidence of fracture or other focal bone lesions. Soft tissue laceration over the posterior forearm. No radiopaque foreign body. IMPRESSION: 1.  No fracture or dislocation of the right forearm. 2. Soft tissue laceration over the posterior forearm. No radiopaque foreign body. Electronically Signed   By: Delanna Ahmadi M.D.   On: 09/22/2021 14:53    Procedures Laceration Repair  Date/Time: 09/22/2021 3:30 PM Performed by: Peri Jefferson, PA-C Authorized by: Peri Jefferson, PA-C   Consent:    Consent obtained:  Verbal   Consent given by:  Patient   Risks discussed:  Infection, pain, poor cosmetic result and poor wound healing   Alternatives discussed:  Referral Universal protocol:    Procedure explained and questions answered to patient or proxy's satisfaction: yes     Patient identity confirmed:  Verbally with patient and arm band Anesthesia:    Anesthesia method:  Local infiltration   Local anesthetic:  Lidocaine 1% w/o epi Laceration details:    Location:  Shoulder/arm   Shoulder/arm location:  R lower arm   Length (cm):  2 Pre-procedure  details:    Preparation:  Patient was  prepped and draped in usual sterile fashion Exploration:    Limited defect created (wound extended): no     Hemostasis achieved with:  Direct pressure   Imaging obtained: x-ray     Imaging outcome: foreign body not noted     Wound exploration: wound explored through full range of motion     Wound extent: areolar tissue violated, fascia violated and muscle damage     Wound extent: no nerve damage noted, no tendon damage noted and no underlying fracture noted     Contaminated: no   Treatment:    Area cleansed with:  Chlorhexidine and saline   Amount of cleaning:  Extensive   Irrigation solution:  Sterile saline   Irrigation volume:  250   Irrigation method:  Pressure wash   Layers/structures repaired:  Muscle fascia Muscle fascia:    Suture size:  6-0 (larger suture size unavailable, discussed with patient, pt wishes to proceed)   Suture material:  Vicryl   Suture technique:  Simple interrupted   Number of sutures:  1 Skin repair:    Repair method:  Sutures   Suture size:  4-0   Suture material:  Nylon   Suture technique:  Simple interrupted   Number of sutures:  2 Approximation:    Approximation:  Loose Repair type:    Repair type:  Intermediate Post-procedure details:    Dressing:  Antibiotic ointment   Procedure completion:  Tolerated well, no immediate complications Comments:     Discussed risks and benefits of suturing vs leaving open.  Due to size and depth of wound, patient agreed to have sutures placed.  He is aware of risk of infection.   (including critical care time)  Medications Ordered in UC Medications  ketorolac (TORADOL) injection 60 mg (60 mg Intramuscular Given 09/22/21 1446)    Initial Impression / Assessment and Plan / UC Course  I have reviewed the triage vital signs and the nursing notes.  Pertinent labs & imaging results that were available during my care of the patient were reviewed by me and considered in my medical decision making (see chart for  details).     Do not submerge in water Change dressing daily Take antibiotics Return 7 - 10 days for suture removal, sooner with any signs of infection such as spreading redness, pain, tenderness, or discharge  Numbness of hand resolved after lidocaine placed at laceration site RROM returned to fingers after lidocaine placed at laceration site  Final Clinical Impressions(s) / UC Diagnoses   Final diagnoses:  Laceration of right forearm, initial encounter  Dog bite, initial encounter     Discharge Instructions      Change dressing daily Return sooner if you have ANY worsening symptoms Go to ED if you experience numbness and weakness again Keep wound clean, do not submerge wound in water     ED Prescriptions     Medication Sig Dispense Auth. Provider   doxycycline (VIBRAMYCIN) 100 MG capsule Take 1 capsule (100 mg total) by mouth 2 (two) times daily. 10 capsule Peri Jefferson, PA-C      PDMP not reviewed this encounter.   Peri Jefferson, PA-C 09/22/21 1535    Peri Jefferson, PA-C 09/22/21 1536

## 2021-09-22 NOTE — Discharge Instructions (Addendum)
Change dressing daily Return sooner if you have ANY worsening symptoms Go to ED if you experience numbness and weakness again Keep wound clean, do not submerge wound in water

## 2021-09-28 ENCOUNTER — Other Ambulatory Visit: Payer: Self-pay | Admitting: Cardiovascular Disease

## 2021-09-28 MED ORDER — CLINDAMYCIN HCL 150 MG PO CAPS: ORAL_CAPSULE | ORAL | 0 refills | Status: DC

## 2021-09-28 NOTE — Telephone Encounter (Signed)
Prophylactic cleocin sent to pharmacy.

## 2021-10-01 ENCOUNTER — Encounter: Payer: Self-pay | Admitting: Physician Assistant

## 2021-10-01 ENCOUNTER — Other Ambulatory Visit: Payer: Self-pay

## 2021-10-01 ENCOUNTER — Ambulatory Visit: Payer: Federal, State, Local not specified - PPO | Admitting: Physician Assistant

## 2021-10-01 ENCOUNTER — Ambulatory Visit (HOSPITAL_COMMUNITY)
Admission: RE | Admit: 2021-10-01 | Discharge: 2021-10-01 | Disposition: A | Discharge status: Home / Self Care | Payer: Federal, State, Local not specified - PPO | Source: Ambulatory Visit | Attending: Surgery | Admitting: Surgery

## 2021-10-01 VITALS — BP 152/81 | HR 66 | Temp 98.6°F | Resp 20 | Ht 78.0 in | Wt 150.0 lb

## 2021-10-01 DIAGNOSIS — G9519 Other vascular myelopathies: Secondary | ICD-10-CM

## 2021-10-01 DIAGNOSIS — I83893 Varicose veins of bilateral lower extremities with other complications: Secondary | ICD-10-CM | POA: Insufficient documentation

## 2021-10-01 NOTE — Progress Notes (Signed)
Office Note     CC:  follow up Requesting Provider:  Lorre Munroe, NP  HPI: Christopher Ball is a 51 y.o. 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  Past medical history significant for Marfan syndrome.  Surgical history significant for thoracic aortic aneurysm repair in 2007 by Dr. [name] requiring aortic bioprosthetic valve as well as root replacement and reimplantation of coronary arteries.  He was referred to this office by Dr. Gery Pray who he sees on an annual basis.  He denies history of DVT, venous ulcerations, trauma, or prior vascular intervention of bilateral lower extremities.  He does not have any pain associated with his varicose veins.  He does however have burning and cramping in his calves and thighs with ambulating long distances.  He has a history of lumbar spine stenosis.  He has been offered lumbar spine surgery however does not want to proceed.  He is a tobacco user.  He denies rest pain or nonhealing wounds of bilateral lower extremities.   Past Medical History:  Diagnosis Date   Marfan syndrome    Stomach problems    Thoracic aortic aneurysm    replacement by Dr. [name] /30/07 with aortic bioprosthetic valve   Tobacco abuse     Past Surgical History:  Procedure Laterality Date   AORTIC VALVE REPLACEMENT     VASECTOMY  09/16/2002    Social History   Socioeconomic History   Marital status: Married    Spouse name: Not on file   Number of children: Not on file   Years of education: Not on file   Highest education level: Not on file  Occupational History   Not on file  Tobacco Use   Smoking status: Every Day    Packs/day: 1.00    Years: 20.00    Pack years: 20.00    Types: Cigars, Cigarettes   Smokeless tobacco: Never  Substance and Sexual Activity   Alcohol use: Yes    Alcohol/week: 1.0 standard drink    Types: 1 Shots of liquor per week    Comment: rare   Drug use: No   Sexual  activity: Yes  Other Topics Concern   Not on file  Social History Narrative   Not on file   Social Determinants of Health   Financial Resource Strain: Not on file  Food Insecurity: Not on file  Transportation Needs: Not on file  Physical Activity: Not on file  Stress: Not on file  Social Connections: Not on file  Intimate Partner Violence: Not on file    Family History  Problem Relation Age of Onset   Asthma Mother    Early death Father    Heart disease Father    Asthma Brother    Diabetes Maternal Grandmother    Cancer Neg Hx    Stroke Neg Hx    Kidney disease Neg Hx    Hypertension Neg Hx    Hyperlipidemia Neg Hx     Current Outpatient Medications  Medication Sig Dispense Refill   atorvastatin (LIPITOR) 20 MG tablet TAKE 1 TABLET (20 MG TOTAL) BY MOUTH DAILY. KEEP OFFICE VISIT FOR FUTURE REFILLS. 30 tablet 3   clindamycin (CLEOCIN) 150 MG capsule Clindamycin 150 mg capsule = take 4 capsules (600 mg) 1 hour prior to dental procedure 4 capsule 0   cyclobenzaprine (FLEXERIL) 10 MG tablet Take 1 tablet (10 mg total) by mouth at bedtime as needed for muscle spasms. 20 tablet  0   doxycycline (VIBRAMYCIN) 100 MG capsule Take 1 capsule (100 mg total) by mouth 2 (two) times daily. 10 capsule 0   No current facility-administered medications for this visit.    Allergies  Allergen Reactions   Augmentin [Amoxicillin-Pot Clavulanate] Shortness Of Breath and Nausea And Vomiting     REVIEW OF SYSTEMS:   [X]  denotes positive finding, [ ]  denotes negative finding Cardiac  Comments:  Chest pain or chest pressure:    Shortness of breath upon exertion:    Short of breath when lying flat:    Irregular heart rhythm:        Vascular    Pain in calf, thigh, or hip brought on by ambulation:    Pain in feet at night that wakes you up from your sleep:     Blood clot in your veins:    Leg swelling:         Pulmonary    Oxygen at home:    Productive cough:     Wheezing:          Neurologic    Sudden weakness in arms or legs:     Sudden numbness in arms or legs:     Sudden onset of difficulty speaking or slurred speech:    Temporary loss of vision in one eye:     Problems with dizziness:         Gastrointestinal    Blood in stool:     Vomited blood:         Genitourinary    Burning when urinating:     Blood in urine:        Psychiatric    Major depression:         Hematologic    Bleeding problems:    Problems with blood clotting too easily:        Skin    Rashes or ulcers:        Constitutional    Fever or chills:      PHYSICAL EXAMINATION:  Vitals:   10/01/21 1354  BP: (!) 152/81  Pulse: 66  Resp: 20  Temp: 98.6 F (37 C)  TempSrc: Temporal  SpO2: 99%  Weight: 150 lb (68 kg)  Height: 6\' 6"  (1.981 m)    General:  WDWN in NAD; vital signs documented above Gait: Not observed HENT: WNL, normocephalic Pulmonary: normal non-labored breathing , without Rales, rhonchi,  wheezing Cardiac: regular HR Abdomen: soft, NT, no masses Skin: without rashes Vascular Exam/Pulses:  Right Left  Radial 2+ (normal) 2+ (normal)  DP 2+ (normal) 2+ (normal)  PT 2+ (normal) 2+ (normal)   Extremities: Bulging varicosities of the right posterior thigh and posterior and medial calf; varicosities of left medial calf; no pitting edema noted bilateral lower extremity Musculoskeletal: no muscle wasting or atrophy  Neurologic: A&O X 3;  No focal weakness or paresthesias are detected Psychiatric:  The pt has Normal affect.   Non-Invasive Vascular Imaging:   Right lower extremity venous reflux study negative for DVT Negative for deep venous reflux Scattered reflux noted in saphenous vein    ASSESSMENT/PLAN:: 51 y.o. male here for evaluation of asymptomatic varicose veins; claudication symptoms of thighs and calves   -Patient is experiencing symmetrical burning and cramping in the medial thigh and calfs with ambulation long distances -Unlikely vascular  claudication given easily palpable PT and DP pulses bilaterally; etiology of claudication likely lumbar spine.  I encouraged the patient to remain active however would follow-up with Ortho  versus neurosurgery if symptoms were to become unbearable -He appears to have bilateral lower extremity asymptomatic varicose veins.  Right lower extremity venous reflux study was negative for DVT.  Study was also negative for deep venous reflux.  He does have some reflux in the greater saphenous vein however the saphenofemoral junction is competent thus he would not be a great candidate for laser ablation.  Should these veins begin to bother him I recommended wearing 15 to 20 mmHg knee-high compression stockings on a daily basis.  He should also elevate his legs above the level of his heart periodically throughout the day.  I also encouraged him to avoid prolonged sitting and standing. -Patient will call/return office sooner with any questions or concerns.  For now he will follow-up on an as-needed basis   Emilie Rutter, PA-C [facility] [phone]  Clinic MD:   Myra Gianotti

## 2021-10-03 ENCOUNTER — Ambulatory Visit
Admission: EM | Admit: 2021-10-03 | Discharge: 2021-10-03 | Disposition: A | Discharge status: Home / Self Care | Payer: Federal, State, Local not specified - PPO

## 2021-10-03 ENCOUNTER — Other Ambulatory Visit: Payer: Self-pay

## 2021-10-03 DIAGNOSIS — Z4802 Encounter for removal of sutures: Secondary | ICD-10-CM

## 2021-10-03 NOTE — ED Triage Notes (Signed)
Pt here for rn visit suture removal. Sutures removed without complication. Education provided.

## 2021-10-16 DIAGNOSIS — N5201 Erectile dysfunction due to arterial insufficiency: Secondary | ICD-10-CM | POA: Diagnosis not present

## 2021-10-16 DIAGNOSIS — Z681 Body mass index (BMI) 19 or less, adult: Secondary | ICD-10-CM | POA: Diagnosis not present

## 2021-10-16 DIAGNOSIS — E291 Testicular hypofunction: Secondary | ICD-10-CM | POA: Diagnosis not present

## 2021-10-16 DIAGNOSIS — N486 Induration penis plastica: Secondary | ICD-10-CM | POA: Diagnosis not present

## 2021-12-26 ENCOUNTER — Other Ambulatory Visit: Payer: Self-pay | Admitting: Cardiovascular Disease

## 2021-12-26 DIAGNOSIS — E7800 Pure hypercholesterolemia, unspecified: Secondary | ICD-10-CM

## 2022-01-23 ENCOUNTER — Encounter (INDEPENDENT_AMBULATORY_CARE_PROVIDER_SITE_OTHER): Payer: Federal, State, Local not specified - PPO | Admitting: Ophthalmology

## 2022-01-23 DIAGNOSIS — H2513 Age-related nuclear cataract, bilateral: Secondary | ICD-10-CM

## 2022-01-23 DIAGNOSIS — H35413 Lattice degeneration of retina, bilateral: Secondary | ICD-10-CM

## 2022-01-23 DIAGNOSIS — H43813 Vitreous degeneration, bilateral: Secondary | ICD-10-CM

## 2022-01-23 DIAGNOSIS — H33303 Unspecified retinal break, bilateral: Secondary | ICD-10-CM | POA: Diagnosis not present

## 2022-02-19 DIAGNOSIS — L820 Inflamed seborrheic keratosis: Secondary | ICD-10-CM | POA: Diagnosis not present

## 2022-02-19 DIAGNOSIS — X32XXXD Exposure to sunlight, subsequent encounter: Secondary | ICD-10-CM | POA: Diagnosis not present

## 2022-02-19 DIAGNOSIS — L570 Actinic keratosis: Secondary | ICD-10-CM | POA: Diagnosis not present

## 2022-03-27 ENCOUNTER — Encounter (HOSPITAL_COMMUNITY): Payer: Self-pay

## 2022-03-27 ENCOUNTER — Emergency Department (HOSPITAL_COMMUNITY)
Admission: EM | Admit: 2022-03-27 | Discharge: 2022-03-27 | Disposition: A | Discharge status: Home / Self Care | Payer: Federal, State, Local not specified - PPO | Attending: Emergency Medicine | Admitting: Emergency Medicine

## 2022-03-27 ENCOUNTER — Emergency Department (HOSPITAL_COMMUNITY): Payer: Federal, State, Local not specified - PPO

## 2022-03-27 DIAGNOSIS — S61452A Open bite of left hand, initial encounter: Secondary | ICD-10-CM | POA: Diagnosis not present

## 2022-03-27 DIAGNOSIS — Z23 Encounter for immunization: Secondary | ICD-10-CM | POA: Insufficient documentation

## 2022-03-27 DIAGNOSIS — S51832A Puncture wound without foreign body of left forearm, initial encounter: Secondary | ICD-10-CM | POA: Insufficient documentation

## 2022-03-27 DIAGNOSIS — R001 Bradycardia, unspecified: Secondary | ICD-10-CM | POA: Insufficient documentation

## 2022-03-27 DIAGNOSIS — S51852A Open bite of left forearm, initial encounter: Secondary | ICD-10-CM | POA: Diagnosis not present

## 2022-03-27 DIAGNOSIS — W540XXA Bitten by dog, initial encounter: Secondary | ICD-10-CM | POA: Insufficient documentation

## 2022-03-27 DIAGNOSIS — S51812A Laceration without foreign body of left forearm, initial encounter: Secondary | ICD-10-CM | POA: Diagnosis not present

## 2022-03-27 DIAGNOSIS — S61412A Laceration without foreign body of left hand, initial encounter: Secondary | ICD-10-CM | POA: Diagnosis not present

## 2022-03-27 DIAGNOSIS — S6992XA Unspecified injury of left wrist, hand and finger(s), initial encounter: Secondary | ICD-10-CM | POA: Diagnosis not present

## 2022-03-27 MED ORDER — CLINDAMYCIN HCL 300 MG PO CAPS: 300.0000 mg | ORAL_CAPSULE | Freq: Three times a day (TID) | ORAL | 0 refills | Status: AC

## 2022-03-27 MED ORDER — TETANUS-DIPHTH-ACELL PERTUSSIS 5-2.5-18.5 LF-MCG/0.5 IM SUSY
0.5000 mL | PREFILLED_SYRINGE | Freq: Once | INTRAMUSCULAR | Status: AC
  Administered 2022-03-27: 0.5 mL via INTRAMUSCULAR
  Filled 2022-03-27: qty 0.5

## 2022-03-27 MED ORDER — OXYCODONE-ACETAMINOPHEN 5-325 MG PO TABS: 1.0000 | ORAL_TABLET | ORAL | 0 refills | Status: DC | PRN

## 2022-03-27 MED ORDER — SULFAMETHOXAZOLE-TRIMETHOPRIM 800-160 MG PO TABS
1.0000 | ORAL_TABLET | Freq: Once | ORAL | Status: AC
  Administered 2022-03-27: 1 via ORAL
  Filled 2022-03-27: qty 1

## 2022-03-27 MED ORDER — ONDANSETRON 4 MG PO TBDP
4.0000 mg | ORAL_TABLET | Freq: Once | ORAL | Status: AC
Start: 2022-03-27 — End: 2022-03-27
  Administered 2022-03-27: 4 mg via ORAL
  Filled 2022-03-27: qty 1

## 2022-03-27 MED ORDER — LIDOCAINE HCL 2 % IJ SOLN
10.0000 mL | Freq: Once | INTRAMUSCULAR | Status: AC
Start: 2022-03-27 — End: 2022-03-27
  Administered 2022-03-27: 200 mg
  Filled 2022-03-27: qty 20

## 2022-03-27 MED ORDER — OXYCODONE-ACETAMINOPHEN 5-325 MG PO TABS
2.0000 | ORAL_TABLET | Freq: Once | ORAL | Status: AC
  Administered 2022-03-27: 2 via ORAL
  Filled 2022-03-27: qty 2

## 2022-03-27 MED ORDER — CLINDAMYCIN HCL 300 MG PO CAPS
300.0000 mg | ORAL_CAPSULE | Freq: Once | ORAL | Status: AC
  Administered 2022-03-27: 300 mg via ORAL
  Filled 2022-03-27: qty 1

## 2022-03-27 MED ORDER — SULFAMETHOXAZOLE-TRIMETHOPRIM 800-160 MG PO TABS: 1.0000 | ORAL_TABLET | Freq: Two times a day (BID) | ORAL | 0 refills | Status: AC

## 2022-03-27 NOTE — ED Provider Triage Note (Signed)
Emergency Medicine Provider Triage Evaluation Note  Christopher Ball , a 51 y.o. male  was evaluated in triage.  Pt complains of dog bite to the left hand and forearm sustained just prior to arrival.  He was bitten by his dog in 3 different places.  Tetanus is up-to-date.  Pressure applied prior to arrival.  No other treatments.  No anticoagulation.  Review of Systems  Positive: Wounds Negative:   Physical Exam  There were no vitals taken for this visit. Gen:   Awake, no distress   Resp:  Normal effort  MSK:   Moves extremities without difficulty  Other:  Irregular lacerations over the thenar eminence of the left hand, active oozing of blood, small puncture to the distal forearm as well  Medical Decision Making  Medically screening exam initiated at 5:26 PM.  Appropriate orders placed.  Christopher Ball was informed that the remainder of the evaluation will be completed by another provider, this initial triage assessment does not replace that evaluation, and the importance of remaining in the ED until their evaluation is complete.     Carlisle Cater, PA-C 03/27/22 1727

## 2022-03-27 NOTE — ED Provider Notes (Signed)
[facility] DEPT Provider Note   CSN: [id number] Arrival date & time: 03/27/22  1719     History No pertinent PMHx Chief Complaint  Patient presents with   Animal Bite    Christopher Ball is a 51 y.o. male.  Pt states his two dogs got in fight over food because pt accidentally left dog food bin open, pt and wife both grabbed a dog, pt was bit by [place] bull dog on left forearm hand. This occurred around 5 pm today and pt and wife came straight here. Dog is UTD on rabies vaccines. Last tetanus shot was in 2016. He has not taken anything for pain and states he is in severe pain currently.    Animal Bite Associated symptoms: no fever and no numbness        Home Medications Prior to Admission medications   Medication Sig Start Date End Date Taking? Authorizing Provider  clindamycin (CLEOCIN) 300 MG capsule Take 1 capsule (300 mg total) by mouth 3 (three) times daily for 7 days. 03/27/22 04/03/22 Yes Precious Gilding, DO  oxyCODONE-acetaminophen (PERCOCET) 5-325 MG tablet Take 1-2 tablets by mouth every 4 (four) hours as needed for severe pain. 03/27/22  Yes Precious Gilding, DO  sulfamethoxazole-trimethoprim (BACTRIM DS) 800-160 MG tablet Take 1 tablet by mouth 2 (two) times daily for 7 days. 03/27/22 04/03/22 Yes Precious Gilding, DO  atorvastatin (LIPITOR) 20 MG tablet TAKE 1 TABLET (20 MG TOTAL) BY MOUTH DAILY. KEEP OFFICE VISIT FOR FUTURE REFILLS. 12/26/21   Lorretta Harp, MD  cyclobenzaprine (FLEXERIL) 10 MG tablet Take 1 tablet (10 mg total) by mouth at bedtime as needed for muscle spasms. 07/10/21   Jearld Fenton, NP  doxycycline (VIBRAMYCIN) 100 MG capsule Take 1 capsule (100 mg total) by mouth 2 (two) times daily. 09/22/21   Peri Jefferson, PA-C      Allergies    Augmentin [amoxicillin-pot clavulanate]    Review of Systems   Review of Systems  Constitutional:  Negative for fever.  Skin:  Positive for wound.  Neurological:  Positive for weakness.  Negative for numbness.  Psychiatric/Behavioral:  The patient is nervous/anxious.     Physical Exam Updated Vital Signs BP 114/61 (BP Location: Left Arm)   Pulse (!) 59   Temp 97.9 F (36.6 C) (Oral)   Resp 16   SpO2 99%  Physical Exam Constitutional:      Appearance: He is not ill-appearing, toxic-appearing or diaphoretic.  Cardiovascular:     Rate and Rhythm: Regular rhythm. Bradycardia present.     Heart sounds: Normal heart sounds.  Pulmonary:     Effort: Pulmonary effort is normal.     Breath sounds: Normal breath sounds.  Abdominal:     General: Abdomen is flat. Bowel sounds are normal. There is no distension.     Palpations: Abdomen is soft.     Tenderness: There is no abdominal tenderness.  Skin:    Findings: Wound present.     Comments: Small puncture wound on left forearm.  Bite wound on thenar eminence actively oozing blood with area of missing skin approximately 1.5 x 1.5 cm and nonbleeding bite wound on dorsal side of left hand at base of thumb  Neurological:     General: No focal deficit present.     Mental Status: He is alert and oriented to person, place, and time.  Psychiatric:        Mood and Affect: Mood normal.  Behavior: Behavior normal.     ED Results / Procedures / Treatments   Labs (all labs ordered are listed, but only abnormal results are displayed) Labs Reviewed - No data to display  EKG None  Radiology DG Forearm Left  Result Date: 03/27/2022 CLINICAL DATA:  Dog bite. EXAM: LEFT FOREARM - 2 VIEW COMPARISON:  None Available. FINDINGS: There is no evidence of fracture or other focal bone lesions. Soft tissues are unremarkable. IMPRESSION: Negative. Electronically Signed   By: Ronney Asters M.D.   On: 03/27/2022 18:01   DG Hand Complete Left  Result Date: 03/27/2022 CLINICAL DATA:  Dog bite. EXAM: LEFT HAND - COMPLETE 3+ VIEW COMPARISON:  None Available. FINDINGS: There is no evidence of fracture or dislocation. There is no evidence of  arthropathy or other focal bone abnormality. Soft tissues are unremarkable. No evidence for foreign body. There is a ring on the fourth finger. IMPRESSION: Negative. Electronically Signed   By: Ronney Asters M.D.   On: 03/27/2022 18:00    Procedures .Marland KitchenLaceration Repair  Date/Time: 03/27/2022 8:30 PM  Performed by: Precious Gilding, DO Authorized by: Drenda Freeze, MD   Consent:    Consent obtained:  Verbal   Consent given by:  Patient   Risks discussed:  Infection and pain Anesthesia:    Anesthesia method:  Local infiltration   Local anesthetic:  Lidocaine 2% w/o epi Laceration details:    Location:  Hand   Hand location:  L palm Pre-procedure details:    Preparation:  Patient was prepped and draped in usual sterile fashion Exploration:    Hemostasis achieved with:  Direct pressure   Imaging obtained: x-ray     Imaging outcome: foreign body not noted     Wound extent: no foreign bodies/material noted, no muscle damage noted, no nerve damage noted, no tendon damage noted, no underlying fracture noted and no vascular damage noted   Treatment:    Area cleansed with:  Saline   Amount of cleaning:  Standard   Irrigation solution:  Sterile saline   Irrigation method:  Syringe Skin repair:    Repair method:  Sutures   Suture size:  4-0   Suture material:  Nylon   Suture technique:  Simple interrupted   Number of sutures:  4 Approximation:    Approximation:  Loose Post-procedure details:    Dressing:  Non-adherent dressing   Procedure completion:  Tolerated well, no immediate complications Comments:     Small patch of Xeroform applied to area with missing skin.  3 Steri-Strips applied to the nonbleeding wound on dorsum of L hand    Medications Ordered in ED Medications  oxyCODONE-acetaminophen (PERCOCET/ROXICET) 5-325 MG per tablet 2 tablet (2 tablets Oral Given 03/27/22 1911)  ondansetron (ZOFRAN-ODT) disintegrating tablet 4 mg (4 mg Oral Given 03/27/22 1911)  Tdap  (BOOSTRIX) injection 0.5 mL (0.5 mLs Intramuscular Given 03/27/22 1912)  clindamycin (CLEOCIN) capsule 300 mg (300 mg Oral Given 03/27/22 1911)  sulfamethoxazole-trimethoprim (BACTRIM DS) 800-160 MG per tablet 1 tablet (1 tablet Oral Given 03/27/22 1912)  lidocaine (XYLOCAINE) 2 % (with pres) injection 200 mg (200 mg Infiltration Given 03/27/22 2114)    ED Course/ Medical Decision Making/ A&P                           Medical Decision Making Pt presents with dog bite wounds of left forearm and left hand near thenar eminence. He is neurovascularly intact.  X-ray of the  left forearm and left hand showed no acute fracture, dislocation, or foreign body.  He was given Percocet 5-325 mg 2 tablets for pain.  He was given 1 dose of clindamycin 300 mg and 1 dose of Bactrim 800-160 mg in the ED.  Wounds were cared for with loose simple sutures, Steri-Strips, Xeroform, and dressing applied as described in procedure note above.  Vital signs remained stable, pain controlled with Percocet.  As patient has allergy to Augmentin, he was discharged with 7 days of Bactrim 800-160 mg twice daily and clindamycin 300 mg 3 times daily. He was given instructions to leave the dressing intact until following up with either PCP or urgent care for wound check in 2 to 3 days and that sutures would need to be removed in 7 days.  Risk Prescription drug management.   Final Clinical Impression(s) / ED Diagnoses Final diagnoses:  Dog bite, initial encounter    Rx / DC Orders ED Discharge Orders          Ordered    oxyCODONE-acetaminophen (PERCOCET) 5-325 MG tablet  Every 4 hours PRN        03/27/22 2112    clindamycin (CLEOCIN) 300 MG capsule  3 times daily        03/27/22 2112    sulfamethoxazole-trimethoprim (BACTRIM DS) 800-160 MG tablet  2 times daily        03/27/22 2112              Precious Gilding, DO 03/27/22 2359    Drenda Freeze, MD 03/28/22 1500

## 2022-03-27 NOTE — Discharge Instructions (Signed)
Thank you for allowing [place] to care for you during your stay.  Please leave your dressing in place and follow up with your primary care doctor or an urgent care in 2 to 3 days for a wound check.  You sutures will need to be removed in one week.   I have sent in a prescription for 2 antibiotics to your pharmacy, please complete both of these.  I have also sent in pain medication.

## 2022-03-27 NOTE — ED Triage Notes (Signed)
Pt states that he was bitten by his dog approximately 30 min PTA. Puncture wound noted on L forearm and multiple lacerations

## 2022-03-30 ENCOUNTER — Ambulatory Visit
Admission: EM | Admit: 2022-03-30 | Discharge: 2022-03-30 | Disposition: A | Discharge status: Home / Self Care | Payer: Federal, State, Local not specified - PPO

## 2022-03-30 ENCOUNTER — Encounter: Payer: Self-pay | Admitting: Emergency Medicine

## 2022-03-30 DIAGNOSIS — Z5189 Encounter for other specified aftercare: Secondary | ICD-10-CM | POA: Diagnosis not present

## 2022-03-30 DIAGNOSIS — W540XXD Bitten by dog, subsequent encounter: Secondary | ICD-10-CM

## 2022-03-30 NOTE — Discharge Instructions (Signed)
Continue with antibiotic  Keep wound clean and covered, recommend changing the dressing once daily Follow up Wednesday or Thursday this week for wound check and suture removal If you notice increased redness, swelling, or discharge return for evaluation sooner.

## 2022-03-30 NOTE — ED Provider Notes (Signed)
[facility]    CSN: [id number] Arrival date & time: 03/30/22  0857      History   Chief Complaint Chief Complaint  Patient presents with   Dog Bite Wound Check    HPI Christopher Ball is a 51 y.o. male.   Pt presents for a wound check after he was bitten by a dog four days ago.  Wound loosely approximated with sutures, pt placed on antibiotics.  He is still taking antibiotics. He has not changed the dressing since sutures placed.  He denies increased pain, swelling, redness, drainage, fever, or chills.     Past Medical History:  Diagnosis Date   Marfan syndrome    Stomach problems    Thoracic aortic aneurysm ([place])    replacement by Dr. 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 Authorizing Provider  atorvastatin (LIPITOR) 20 MG tablet TAKE 1 TABLET (20 MG TOTAL) BY MOUTH DAILY. KEEP OFFICE VISIT FOR FUTURE REFILLS. 12/26/21  Yes Lorretta Harp, MD  clindamycin (CLEOCIN) 300 MG capsule Take 1 capsule (300 mg total) by mouth 3 (three) times daily for 7 days. 03/27/22 04/03/22 Yes Precious Gilding, DO  cyclobenzaprine (FLEXERIL) 10 MG tablet Take 1 tablet (10 mg total) by mouth at bedtime as needed for muscle spasms. 07/10/21  Yes Baity, Coralie Keens, NP  doxycycline (VIBRAMYCIN) 100 MG capsule Take 1 capsule (100 mg total) by mouth 2 (two) times daily. 09/22/21  Yes Peri Jefferson, PA-C  oxyCODONE-acetaminophen (PERCOCET) 5-325 MG tablet Take 1-2 tablets by mouth every 4 (four) hours as needed for severe pain. 03/27/22  Yes Precious Gilding, DO   sulfamethoxazole-trimethoprim (BACTRIM DS) 800-160 MG tablet Take 1 tablet by mouth 2 (two) times daily for 7 days. 03/27/22 04/03/22 Yes Precious Gilding, DO    Family History Family History  Problem Relation Age of Onset   Asthma Mother    Early death Father    Heart disease Father    Asthma Brother    Diabetes Maternal Grandmother    Cancer Neg Hx    Stroke Neg Hx    Kidney disease Neg Hx    Hypertension Neg Hx    Hyperlipidemia Neg Hx     Social History Social History   Tobacco Use   Smoking status: Every Day    Packs/day: 1.00    Years: 20.00    Total pack years: 20.00    Types: Cigars, Cigarettes   Smokeless tobacco: Never  Substance Use Topics   Alcohol use: Yes    Alcohol/week: 1.0 standard drink of alcohol    Types: 1 Shots of liquor per week    Comment: rare   Drug use: No     Allergies   Augmentin [amoxicillin-pot clavulanate]   Review of Systems Review of Systems  Constitutional:  Negative for chills and fever.  HENT:  Negative for ear pain and sore throat.   Eyes:  Negative for pain and visual disturbance.  Respiratory:  Negative for cough and shortness of breath.   Cardiovascular:  Negative  for chest pain and palpitations.  Gastrointestinal:  Negative for abdominal pain and vomiting.  Genitourinary:  Negative for dysuria and hematuria.  Musculoskeletal:  Negative for arthralgias and back pain.  Skin:  Negative for color change and rash.  Neurological:  Negative for seizures and syncope.  All other systems reviewed and are negative.    Physical Exam Triage Vital Signs ED Triage Vitals  Enc Vitals Group     BP 03/30/22 0905 132/71     Pulse Rate 03/30/22 0905 67     Resp 03/30/22 0905 18     Temp 03/30/22 0905 (!) 97.5 F (36.4 C)     Temp Source 03/30/22 0905 Oral     SpO2 03/30/22 0905 97 %     Weight 03/30/22 0909 149 lb 14.6 oz (68 kg)     Height 03/30/22 0909 '6\' 6"'$  (1.981 m)     Head Circumference --      Peak Flow --      Pain  Score --      Pain Loc --      Pain Edu? --      Excl. in [place]? --    No data found.  Updated Vital Signs BP 132/71 (BP Location: Left Arm)   Pulse 67   Temp (!) 97.5 F (36.4 C) (Oral)   Resp 18   Ht '6\' 6"'$  (1.981 m)   Wt 149 lb 14.6 oz (68 kg)   SpO2 97%   BMI 17.32 kg/m   Visual Acuity Right Eye Distance:   Left Eye Distance:   Bilateral Distance:    Right Eye Near:   Left Eye Near:    Bilateral Near:     Physical Exam Vitals and nursing note reviewed.  Constitutional:      General: He is not in acute distress.    Appearance: He is well-developed.  HENT:     Head: Normocephalic and atraumatic.  Eyes:     Conjunctiva/sclera: Conjunctivae normal.  Cardiovascular:     Rate and Rhythm: Normal rate and regular rhythm.     Heart sounds: No murmur heard. Pulmonary:     Effort: Pulmonary effort is normal. No respiratory distress.     Breath sounds: Normal breath sounds.  Abdominal:     Palpations: Abdomen is soft.     Tenderness: There is no abdominal tenderness.  Musculoskeletal:        General: No swelling.     Right hand: Normal.     Cervical back: Neck supple.     Comments: Left hand with steri strips in place to small puncture wound, healing well with no signs of infection.  Xeroform in place where skin is missing, left intact.  Sutures in place, wound is healing well.  No redness, warmth, drainage, or swelling noted.   Skin:    General: Skin is warm and dry.     Capillary Refill: Capillary refill takes less than 2 seconds.  Neurological:     Mental Status: He is alert.  Psychiatric:        Mood and Affect: Mood normal.      UC Treatments / Results  Labs (all labs ordered are listed, but only abnormal results are displayed) Labs Reviewed - No data to display  EKG   Radiology No results found.  Procedures Procedures (including critical care time)  Medications Ordered in UC Medications - No data to display  Initial Impression / Assessment and  Plan / UC Course  I have  reviewed the triage vital signs and the nursing notes.  Pertinent labs & imaging results that were available during my care of the patient were reviewed by me and considered in my medical decision making (see chart for details).     It appears wound is healing well.  The non stick dressing was stuck to the wound significantly. Able to remove without disrupting xeroform, sutures, or steri strips.  It appears wound is healing well.  No signs of infection noted.  Advised to change the dressing daily to prevent it from sticking to the wound.  Advised to return in 3-4 days for recheck and suture removal.  Return precautions discussed.  Pt will continue with antibiotic.  Final Clinical Impressions(s) / UC Diagnoses   Final diagnoses:  Visit for wound check  Dog bite, subsequent encounter     Discharge Instructions      Continue with antibiotic  Keep wound clean and covered, recommend changing the dressing once daily Follow up Wednesday or Thursday this week for wound check and suture removal If you notice increased redness, swelling, or discharge return for evaluation sooner.    ED Prescriptions   None    PDMP not reviewed this encounter.   Ward, Lenise Arena, PA-C 03/30/22 [phone]

## 2022-03-30 NOTE — ED Triage Notes (Signed)
Patient was bitten by a dog on Wednesday and received multiple stitches in left hand.  Advised to f/u w/PCP for wound check unable to get in with, needs recheck.

## 2022-04-08 ENCOUNTER — Ambulatory Visit
Admission: EM | Admit: 2022-04-08 | Discharge: 2022-04-08 | Disposition: A | Discharge status: Home / Self Care | Payer: Federal, State, Local not specified - PPO

## 2022-04-08 DIAGNOSIS — Z4802 Encounter for removal of sutures: Secondary | ICD-10-CM | POA: Diagnosis not present

## 2022-04-08 DIAGNOSIS — Z5189 Encounter for other specified aftercare: Secondary | ICD-10-CM

## 2022-04-08 NOTE — ED Notes (Signed)
Sutures removed from wound 5 sutures. Wound still has drainage and is very sensitive and painful. Pt expresses inability to move thumb laterally. Np made aware. Wound cleansed with Hibiclens and water.

## 2022-04-08 NOTE — Discharge Instructions (Signed)
Please keep wound covered until healed over.  Follow-up with hand specialty at provided contact information for further evaluation and management given limited range of motion of your hand.

## 2022-04-08 NOTE — ED Triage Notes (Signed)
Pt reports today here for suture removal

## 2022-04-08 NOTE — ED Provider Notes (Signed)
[facility]    CSN: [id number] Arrival date & time: 04/08/22  9371      History   Chief Complaint Chief Complaint  Patient presents with   Suture / Staple Removal    HPI Christopher Ball is a 51 y.o. male.   Patient originally presented for suture removal but I was called to evaluate patient given appearance of hand on exam.  Patient was seen on 03/27/2022 after a dog bite to left hand.  Patient was trying to break up 2 dogs fighting over food when he was bit by an [employer] operational officer on the left hand.  He had a laceration repair at the ER at time of injury.  He also had negative x-rays at that time.  He was started on clindamycin and Bactrim given Augmentin allergy.  He has completed these antibiotics.  He is concerned given that he has limited range of motion of left thumb.  He states that this has been present since injury.   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 Authorizing Provider  atorvastatin (LIPITOR) 20 MG tablet TAKE 1 TABLET (20 MG TOTAL) BY MOUTH DAILY. KEEP OFFICE VISIT FOR FUTURE REFILLS. 12/26/21   Lorretta Harp, MD  cyclobenzaprine (FLEXERIL) 10 MG tablet Take 1 tablet (10 mg total) by mouth at bedtime as needed for muscle spasms. 07/10/21   Jearld Fenton, NP  doxycycline (VIBRAMYCIN) 100 MG capsule Take 1 capsule (100 mg total) by mouth 2 (two) times daily. 09/22/21   Peri Jefferson, PA-C  oxyCODONE-acetaminophen (PERCOCET) 5-325 MG tablet Take 1-2 tablets by mouth every 4 (four) hours as needed for severe pain. 03/27/22   Precious Gilding, DO    Family History Family History  Problem Relation Age of Onset   Asthma Mother    Early death Father    Heart disease Father    Asthma Brother    Diabetes Maternal Grandmother    Cancer Neg Hx    Stroke Neg Hx    Kidney disease Neg Hx    Hypertension Neg Hx    Hyperlipidemia Neg Hx     Social History Social History   Tobacco Use   Smoking status: Every Day    Packs/day: 1.00    Years: 20.00    Total pack years: 20.00    Types: Cigars, Cigarettes   Smokeless tobacco: Never  Substance Use Topics   Alcohol use: Yes    Alcohol/week: 1.0 standard drink of alcohol    Types: 1 Shots of liquor per week    Comment: rare   Drug use: No     Allergies   Augmentin [amoxicillin-pot clavulanate]   Review of Systems Review of Systems Per HPI  Physical Exam Triage Vital Signs ED Triage Vitals  Enc Vitals Group     BP 04/08/22 0850 Marland Kitchen)  164/74     Pulse Rate 04/08/22 0850 62     Resp 04/08/22 0850 18     Temp 04/08/22 0850 (!) 97.4 F (36.3 C)     Temp src --      SpO2 04/08/22 0850 97 %     Weight --      Height --      Head Circumference --      Peak Flow --      Pain Score 04/08/22 0848 0     Pain Loc --      Pain Edu? --      Excl. in [place]? --    No data found.  Updated Vital Signs BP (!) 164/74   Pulse 62   Temp (!) 97.4 F (36.3 C)   Resp 18   SpO2 97%   Visual Acuity Right Eye Distance:   Left Eye Distance:   Bilateral Distance:    Right Eye Near:   Left Eye Near:    Bilateral Near:     Physical Exam Constitutional:      General: He is not in acute distress.    Appearance: Normal appearance. He is not toxic-appearing or diaphoretic.  HENT:     Head: Normocephalic and atraumatic.  Eyes:     Extraocular Movements: Extraocular movements intact.     Conjunctiva/sclera:  Conjunctivae normal.  Pulmonary:     Effort: Pulmonary effort is normal.  Musculoskeletal:     Comments: Patient has flexed position of themar prominence of the thumb.  He has full range of motion of upper thumb.  Capillary refill and pulses intact.  Tenderness with range of motion of thumb and palpation of the more prominence.  Skin:    Comments: Healing laceration present to themar prominence of hand at the thumb.  No increased swelling, erythema, purulent drainage.  Open area with superficial removal of skin present.  Similar injury direct opposite to dorsal surface of hand at lower portion of the thumb.  No signs of infection throughout.  No bleeding noted.  Neurological:     General: No focal deficit present.     Mental Status: He is alert and oriented to person, place, and time. Mental status is at baseline.  Psychiatric:        Mood and Affect: Mood normal.        Behavior: Behavior normal.        Thought Content: Thought content normal.        Judgment: Judgment normal.      UC Treatments / Results  Labs (all labs ordered are listed, but only abnormal results are displayed) Labs Reviewed - No data to display  EKG   Radiology No results found.  Procedures Procedures (including critical care time)  Medications Ordered in UC Medications - No data to display  Initial Impression / Assessment and Plan / UC Course  I have reviewed the triage vital signs and the nursing notes.  Pertinent labs & imaging results that were available during my care of the patient were reviewed by me and considered in my medical decision making (see chart for details).     There are no current signs of infection.  Nonadherent dressing applied prior to patient discharge and patient was advised to keep covered until healed over.  Sutures were removed by nursing staff.  Laceration appears to be healing well with no signs of infection.  Patient advised of wound care.  There is concern given that  patient has  limited range of motion of thumb and appears to be in a more of a flexed position with pain with range of motion to normal position.  Previous imaging was normal.  Advised patient that he will need to see hand specialty given limited range of motion after injury.  Patient provided with contact information for hand specialty for follow-up.  Do not think that emergent evaluation is necessary given duration of time since injury occurred and patient being neurovascularly intact.  Discussed return precautions.  Patient verbalized understanding and was agreeable with plan. Final Clinical Impressions(s) / UC Diagnoses   Final diagnoses:  Visit for suture removal  Visit for wound check     Discharge Instructions      Please keep wound covered until healed over.  Follow-up with hand specialty at provided contact information for further evaluation and management given limited range of motion of your hand.    ED Prescriptions   None    PDMP not reviewed this encounter.   Teodora Medici, [place] 04/08/22 1027

## 2022-04-16 DIAGNOSIS — N486 Induration penis plastica: Secondary | ICD-10-CM | POA: Diagnosis not present

## 2022-04-16 DIAGNOSIS — Z681 Body mass index (BMI) 19 or less, adult: Secondary | ICD-10-CM | POA: Diagnosis not present

## 2022-04-16 DIAGNOSIS — N5201 Erectile dysfunction due to arterial insufficiency: Secondary | ICD-10-CM | POA: Diagnosis not present

## 2022-04-16 DIAGNOSIS — E291 Testicular hypofunction: Secondary | ICD-10-CM | POA: Diagnosis not present

## 2022-04-18 DIAGNOSIS — E291 Testicular hypofunction: Secondary | ICD-10-CM | POA: Diagnosis not present

## 2022-06-18 ENCOUNTER — Encounter: Payer: Self-pay | Admitting: Cardiovascular Disease

## 2022-06-18 MED ORDER — CLINDAMYCIN HCL 300 MG PO CAPS: ORAL_CAPSULE | ORAL | 3 refills | Status: DC

## 2022-07-17 ENCOUNTER — Ambulatory Visit: Payer: Federal, State, Local not specified - PPO | Admitting: Internal Medicine

## 2022-07-17 NOTE — Progress Notes (Deleted)
Subjective:    Patient ID: Christopher Ball, male    DOB: Nov 16, 1970, 51 y.o.   MRN: [id number]  HPI  Patient presents the clinic today with complaint of leg pain.  Review of Systems     Past Medical History:  Diagnosis Date   Marfan syndrome    Stomach problems    Thoracic aortic aneurysm ([facility])    replacement by Dr. [name] /30/07 with aortic bioprosthetic valve   Tobacco abuse     Current Outpatient Medications  Medication Sig Dispense Refill   atorvastatin (LIPITOR) 20 MG tablet TAKE 1 TABLET (20 MG TOTAL) BY MOUTH DAILY. KEEP OFFICE VISIT FOR FUTURE REFILLS. 90 tablet 3   clindamycin (CLEOCIN) 300 MG capsule Take 2 tablets 1 hour prior to dental appointment 2 capsule 3   cyclobenzaprine (FLEXERIL) 10 MG tablet Take 1 tablet (10 mg total) by mouth at bedtime as needed for muscle spasms. 20 tablet 0   doxycycline (VIBRAMYCIN) 100 MG capsule Take 1 capsule (100 mg total) by mouth 2 (two) times daily. 10 capsule 0   oxyCODONE-acetaminophen (PERCOCET) 5-325 MG tablet Take 1-2 tablets by mouth every 4 (four) hours as needed for severe pain. 10 tablet 0   No current facility-administered medications for this visit.    Allergies  Allergen Reactions   Augmentin [Amoxicillin-Pot Clavulanate] Shortness Of Breath and Nausea And Vomiting    Family History  Problem Relation Age of Onset   Asthma Mother    Early death Father    Heart disease Father    Asthma Brother    Diabetes Maternal Grandmother    Cancer Neg Hx    Stroke Neg Hx    Kidney disease Neg Hx    Hypertension Neg Hx    Hyperlipidemia Neg Hx     Social History   Socioeconomic History   Marital status: Married    Spouse name: Not on file   Number of children: Not on file   Years of education: Not on file   Highest education level: Not on file  Occupational History   Not on file  Tobacco Use   Smoking status: Every Day    Packs/day: 1.00    Years: 20.00    Total pack years: 20.00    Types: Cigars,  Cigarettes   Smokeless tobacco: Never  Substance and Sexual Activity   Alcohol use: Yes    Alcohol/week: 1.0 standard drink of alcohol    Types: 1 Shots of liquor per week    Comment: rare   Drug use: No   Sexual activity: Yes  Other Topics Concern   Not on file  Social History Narrative   Not on file   Social Determinants of Health   Financial Resource Strain: Not on file  Food Insecurity: Not on file  Transportation Needs: Not on file  Physical Activity: Not on file  Stress: Not on file  Social Connections: Not on file  Intimate Partner Violence: Not on file     Constitutional: Denies fever, malaise, fatigue, headache or abrupt weight changes.  HEENT: Denies eye pain, eye redness, ear pain, ringing in the ears, wax buildup, runny nose, nasal congestion, bloody nose, or sore throat. Respiratory: Denies difficulty breathing, shortness of breath, cough or sputum production.   Cardiovascular: Denies chest pain, chest tightness, palpitations or swelling in the hands or feet.  Gastrointestinal: Denies abdominal pain, bloating, constipation, diarrhea or blood in the stool.  GU: Denies urgency, frequency, pain with urination, burning sensation, blood  in urine, odor or discharge. Musculoskeletal: Patient reports leg pain.  Denies decrease in range of motion, difficulty with gait, muscle pain or joint pain and swelling.  Skin: Denies redness, rashes, lesions or ulcercations.  Neurological: Denies dizziness, difficulty with memory, difficulty with speech or problems with balance and coordination.  Psych: Denies anxiety, depression, SI/HI.  No other specific complaints in a complete review of systems (except as listed in HPI above).  Objective:   Physical Exam   There were no vitals taken for this visit. Wt Readings from Last 3 Encounters:  03/30/22 149 lb 14.6 oz (68 kg)  10/01/21 150 lb (68 kg)  08/14/21 148 lb (67.1 kg)    General: Appears their stated age, well developed,  well nourished in NAD. Skin: Warm, dry and intact. No rashes, lesions or ulcerations noted. HEENT: Head: normal shape and size; Eyes: sclera white, no icterus, conjunctiva pink, PERRLA and EOMs intact; Ears: Tm's gray and intact, normal light reflex; Nose: mucosa pink and moist, septum midline; Throat/Mouth: Teeth present, mucosa pink and moist, no exudate, lesions or ulcerations noted.  Neck:  Neck supple, trachea midline. No masses, lumps or thyromegaly present.  Cardiovascular: Normal rate and rhythm. S1,S2 noted.  No murmur, rubs or gallops noted. No JVD or BLE edema. No carotid bruits noted. Pulmonary/Chest: Normal effort and positive vesicular breath sounds. No respiratory distress. No wheezes, rales or ronchi noted.  Abdomen: Soft and nontender. Normal bowel sounds. No distention or masses noted. Liver, spleen and kidneys non palpable. Musculoskeletal: Normal range of motion. No signs of joint swelling. No difficulty with gait.  Neurological: Alert and oriented. Cranial nerves II-XII grossly intact. Coordination normal.  Psychiatric: Mood and affect normal. Behavior is normal. Judgment and thought content normal.   BMET    Component Value Date/Time   NA 141 07/10/2021 1502   NA 142 06/23/2017 0917   K 4.5 07/10/2021 1502   CL 106 07/10/2021 1502   CO2 26 07/10/2021 1502   GLUCOSE 84 07/10/2021 1502   BUN 19 07/10/2021 1502   BUN 20 06/23/2017 0917   CREATININE 1.09 07/10/2021 1502   CALCIUM 9.7 07/10/2021 1502   GFRNONAA 68 06/23/2017 0917   GFRAA 78 06/23/2017 0917    Lipid Panel     Component Value Date/Time   CHOL 152 07/10/2021 1502   CHOL 166 08/24/2019 0942   TRIG 79 07/10/2021 1502   HDL 45 07/10/2021 1502   HDL 50 08/24/2019 0942   CHOLHDL 3.4 07/10/2021 1502   LDLCALC 90 07/10/2021 1502    CBC    Component Value Date/Time   WBC 10.1 07/10/2021 1502   RBC 4.32 07/10/2021 1502   HGB 14.5 07/10/2021 1502   HGB 14.4 06/23/2017 0917   HCT 41.8 07/10/2021 1502    HCT 41.6 06/23/2017 0917   PLT 204 07/10/2021 1502   PLT 220 06/23/2017 0917   MCV 96.8 07/10/2021 1502   MCV 94 06/23/2017 0917   MCH 33.6 (H) 07/10/2021 1502   MCHC 34.7 07/10/2021 1502   RDW 12.3 07/10/2021 1502   RDW 12.9 06/23/2017 0917   LYMPHSABS 2.3 01/01/2014 2000   MONOABS 0.6 01/01/2014 2000   EOSABS 0.4 01/01/2014 2000   BASOSABS 0.1 01/01/2014 2000    Hgb A1C Lab Results  Component Value Date   HGBA1C 5.4 06/23/2017           Assessment & Plan:      Schedule an appointment for your annual exam Nicki Reaper, NP

## 2022-07-18 ENCOUNTER — Ambulatory Visit: Payer: Federal, State, Local not specified - PPO | Admitting: Internal Medicine

## 2022-07-18 ENCOUNTER — Telehealth: Payer: Self-pay

## 2022-07-18 ENCOUNTER — Ambulatory Visit
Admission: RE | Admit: 2022-07-18 | Discharge: 2022-07-18 | Disposition: A | Discharge status: Home / Self Care | Payer: Federal, State, Local not specified - PPO | Source: Ambulatory Visit | Attending: Internal Medicine | Admitting: Internal Medicine

## 2022-07-18 ENCOUNTER — Other Ambulatory Visit: Payer: Self-pay

## 2022-07-18 ENCOUNTER — Encounter: Payer: Self-pay | Admitting: Internal Medicine

## 2022-07-18 ENCOUNTER — Ambulatory Visit
Admission: RE | Admit: 2022-07-18 | Discharge: 2022-07-18 | Disposition: A | Discharge status: Home / Self Care | Payer: Federal, State, Local not specified - PPO | Attending: Internal Medicine | Admitting: Internal Medicine

## 2022-07-18 VITALS — BP 124/82 | HR 79 | Temp 96.9°F | Ht 78.0 in | Wt 145.0 lb

## 2022-07-18 DIAGNOSIS — M79651 Pain in right thigh: Secondary | ICD-10-CM

## 2022-07-18 DIAGNOSIS — Z125 Encounter for screening for malignant neoplasm of prostate: Secondary | ICD-10-CM | POA: Diagnosis not present

## 2022-07-18 DIAGNOSIS — Z1211 Encounter for screening for malignant neoplasm of colon: Secondary | ICD-10-CM

## 2022-07-18 DIAGNOSIS — M545 Low back pain, unspecified: Secondary | ICD-10-CM | POA: Diagnosis not present

## 2022-07-18 DIAGNOSIS — Z23 Encounter for immunization: Secondary | ICD-10-CM

## 2022-07-18 DIAGNOSIS — Z0001 Encounter for general adult medical examination with abnormal findings: Secondary | ICD-10-CM

## 2022-07-18 DIAGNOSIS — M79604 Pain in right leg: Secondary | ICD-10-CM

## 2022-07-18 DIAGNOSIS — R7309 Other abnormal glucose: Secondary | ICD-10-CM | POA: Diagnosis not present

## 2022-07-18 DIAGNOSIS — R636 Underweight: Secondary | ICD-10-CM

## 2022-07-18 DIAGNOSIS — R2242 Localized swelling, mass and lump, left lower limb: Secondary | ICD-10-CM | POA: Diagnosis not present

## 2022-07-18 DIAGNOSIS — G8929 Other chronic pain: Secondary | ICD-10-CM | POA: Insufficient documentation

## 2022-07-18 DIAGNOSIS — M5137 Other intervertebral disc degeneration, lumbosacral region: Secondary | ICD-10-CM | POA: Diagnosis not present

## 2022-07-18 DIAGNOSIS — Z122 Encounter for screening for malignant neoplasm of respiratory organs: Secondary | ICD-10-CM

## 2022-07-18 DIAGNOSIS — R739 Hyperglycemia, unspecified: Secondary | ICD-10-CM

## 2022-07-18 NOTE — Progress Notes (Signed)
Subjective:    Patient ID: DEZJUAN COPUS, male    DOB: 01-Sep-1971, 51 y.o.   MRN: [id number]  HPI  Patient presents to clinic today for his annual exam.  He is concerned about localized swelling of his left lower extremity.  He reports this has been persistent for some time.  He reports the swelling is painful.  The pain radiates down to the top of his foot.  He has seen vascular in the past for the same and was advised that it was secondary to varicose veins.  He also reports right thigh pain.  He reports this started 2 months ago.  He describes the pain as sharp and stabbing.  The pain will radiate into his right hip and right low back.  He denies numbness or tingling but reports when the pain is intense, it can cause him to fall due to weakness.  He reports a history of chronic back pain with sciatica but reports this does not feel the same.  He has not tried anything OTC for this.  Flu: 06/2021 Tetanus: 03/2022 Pneumovax: 06/2012 COVID: Pfizer x3 Shingrix: Never PSA screening: 06/2017 Colon screening: Never Vision screening: annually Dentist: biannually  Diet: He does eat meat. He consumes some fruits and veggies. He does eat fried foods. He drinks mostly coffee, sweet tea Exercise: Walking   Review of Systems   Past Medical History:  Diagnosis Date   Marfan syndrome    Stomach problems    Thoracic aortic aneurysm ([facility])    replacement by Dr. [name] /30/07 with aortic bioprosthetic valve   Tobacco abuse     Current Outpatient Medications  Medication Sig Dispense Refill   atorvastatin (LIPITOR) 20 MG tablet TAKE 1 TABLET (20 MG TOTAL) BY MOUTH DAILY. KEEP OFFICE VISIT FOR FUTURE REFILLS. 90 tablet 3   clindamycin (CLEOCIN) 300 MG capsule Take 2 tablets 1 hour prior to dental appointment 2 capsule 3   cyclobenzaprine (FLEXERIL) 10 MG tablet Take 1 tablet (10 mg total) by mouth at bedtime as needed for muscle spasms. 20 tablet 0   doxycycline (VIBRAMYCIN) 100 MG  capsule Take 1 capsule (100 mg total) by mouth 2 (two) times daily. 10 capsule 0   oxyCODONE-acetaminophen (PERCOCET) 5-325 MG tablet Take 1-2 tablets by mouth every 4 (four) hours as needed for severe pain. 10 tablet 0   No current facility-administered medications for this visit.    Allergies  Allergen Reactions   Augmentin [Amoxicillin-Pot Clavulanate] Shortness Of Breath and Nausea And Vomiting    Family History  Problem Relation Age of Onset   Asthma Mother    Early death Father    Heart disease Father    Asthma Brother    Diabetes Maternal Grandmother    Cancer Neg Hx    Stroke Neg Hx    Kidney disease Neg Hx    Hypertension Neg Hx    Hyperlipidemia Neg Hx     Social History   Socioeconomic History   Marital status: Married    Spouse name: Not on file   Number of children: Not on file   Years of education: Not on file   Highest education level: Not on file  Occupational History   Not on file  Tobacco Use   Smoking status: Every Day    Packs/day: 1.00    Years: 20.00    Total pack years: 20.00    Types: Cigars, Cigarettes   Smokeless tobacco: Never  Substance and Sexual Activity  Alcohol use: Yes    Alcohol/week: 1.0 standard drink of alcohol    Types: 1 Shots of liquor per week    Comment: rare   Drug use: No   Sexual activity: Yes  Other Topics Concern   Not on file  Social History Narrative   Not on file   Social Determinants of Health   Financial Resource Strain: Not on file  Food Insecurity: Not on file  Transportation Needs: Not on file  Physical Activity: Not on file  Stress: Not on file  Social Connections: Not on file  Intimate Partner Violence: Not on file     Constitutional: Denies fever, malaise, fatigue, headache or abrupt weight changes.  HEENT: Denies eye pain, eye redness, ear pain, ringing in the ears, wax buildup, runny nose, nasal congestion, bloody nose, or sore throat. Respiratory: Denies difficulty breathing, shortness of  breath, cough or sputum production.   Cardiovascular: Patient reports swelling of his left lower extremity.  Denies chest pain, chest tightness, palpitations or swelling in the hands.  Gastrointestinal: Denies abdominal pain, bloating, constipation, diarrhea or blood in the stool.  GU: Denies urgency, frequency, pain with urination, burning sensation, blood in urine, odor or discharge. Musculoskeletal: Patient reports right thigh pain.  Denies decrease in range of motion, difficulty with gait, or joint swelling.  Skin: Denies redness, rashes, lesions or ulcercations.  Neurological: Denies dizziness, difficulty with memory, difficulty with speech or problems with balance and coordination.  Psych: Denies anxiety, depression, SI/HI.  No other specific complaints in a complete review of systems (except as listed in HPI above).   Objective:   Physical Exam  BP 124/82 (BP Location: Left Arm, Patient Position: Sitting, Cuff Size: Normal)   Pulse 79   Temp (!) 96.9 F (36.1 C) (Temporal)   Ht 6\' 6"  (1.981 m)   Wt 145 lb (65.8 kg)   SpO2 99%   BMI 16.76 kg/m   Wt Readings from Last 3 Encounters:  03/30/22 149 lb 14.6 oz (68 kg)  10/01/21 150 lb (68 kg)  08/14/21 148 lb (67.1 kg)    General: Appears his stated age, underweight due to Marfan's, in NAD. Skin: Warm, dry and intact.  Slight discoloration noted of bilateral shins without ulceration. HEENT: Head: normal shape and size; Eyes: sclera white, no icterus, conjunctiva pink, PERRLA and EOMs intact;  Neck:  Neck supple, trachea midline. No masses, lumps or thyromegaly present.  Cardiovascular: Normal rate and rhythm. S1,S2 noted.  Murmur noted.  Localized swelling noted over the left anterior shin.  No carotid bruits noted. Pulmonary/Chest: Normal effort and positive vesicular breath sounds. No respiratory distress. No wheezes, rales or ronchi noted.  Abdomen: Normal bowel sounds. Musculoskeletal: Normal flexion, extension, rotation  and lateral bending of the spine.  No bony tenderness noted over the spine or right paralumbar muscles.  Pain in the thigh with internal rotation of the right hip.  Normal external rotation of the right hip.  He has pain with palpation of the mid thigh but without discrete mass or abnormality noted.  Strength 5/5 BUE/BLE.  No difficulty with gait.  Neurological: Alert and oriented. Cranial nerves II-XII grossly intact. Coordination normal.  Psychiatric: Mood and affect normal. Behavior is normal. Judgment and thought content normal.    BMET    Component Value Date/Time   NA 141 07/10/2021 1502   NA 142 06/23/2017 0917   K 4.5 07/10/2021 1502   CL 106 07/10/2021 1502   CO2 26 07/10/2021 1502  GLUCOSE 84 07/10/2021 1502   BUN 19 07/10/2021 1502   BUN 20 06/23/2017 0917   CREATININE 1.09 07/10/2021 1502   CALCIUM 9.7 07/10/2021 1502   GFRNONAA 68 06/23/2017 0917   GFRAA 78 06/23/2017 0917    Lipid Panel     Component Value Date/Time   CHOL 152 07/10/2021 1502   CHOL 166 08/24/2019 0942   TRIG 79 07/10/2021 1502   HDL 45 07/10/2021 1502   HDL 50 08/24/2019 0942   CHOLHDL 3.4 07/10/2021 1502   LDLCALC 90 07/10/2021 1502    CBC    Component Value Date/Time   WBC 10.1 07/10/2021 1502   RBC 4.32 07/10/2021 1502   HGB 14.5 07/10/2021 1502   HGB 14.4 06/23/2017 0917   HCT 41.8 07/10/2021 1502   HCT 41.6 06/23/2017 0917   PLT 204 07/10/2021 1502   PLT 220 06/23/2017 0917   MCV 96.8 07/10/2021 1502   MCV 94 06/23/2017 0917   MCH 33.6 (H) 07/10/2021 1502   MCHC 34.7 07/10/2021 1502   RDW 12.3 07/10/2021 1502   RDW 12.9 06/23/2017 0917   LYMPHSABS 2.3 01/01/2014 2000   MONOABS 0.6 01/01/2014 2000   EOSABS 0.4 01/01/2014 2000   BASOSABS 0.1 01/01/2014 2000    Hgb A1C Lab Results  Component Value Date   HGBA1C 5.4 06/23/2017           Assessment & Plan:   Preventative Health Maintenance:  Flu shot today Tetanus UTD I do not think he needs a repeat Pneumovax  at this time Encouraged him to get his COVID booster Discussed Shingrix vaccine, he will check coverage with his insurance company and schedule a nurse visit if he would like to have this done Referral to GI for screening colonoscopy CT lung cancer screening ordered Encouraged him to consume a balanced diet and exercise regimen Advised him to see an eye doctor and dentist annually We will check CBC, c-Met, lipid, A1c and PSA today  Right Thigh Pain, Chronic Low Back Pain:  X-ray lumbar spine today X-ray right femur Will obtain MRI right femur for further evaluation  Localized Swelling Left Lower Extremity:  We will obtain venous ultrasound for further evaluation  RTC in 6 months, follow-up chronic conditions Nicki Reaper, NP

## 2022-07-18 NOTE — Patient Instructions (Signed)
Health Maintenance, Male Adopting a healthy lifestyle and getting preventive care are important in promoting health and wellness. Ask your health care provider about: The right schedule for you to have regular tests and exams. Things you can do on your own to prevent diseases and keep yourself healthy. What should I know about diet, weight, and exercise? Eat a healthy diet  Eat a diet that includes plenty of vegetables, fruits, low-fat dairy products, and lean protein. Do not eat a lot of foods that are high in solid fats, added sugars, or sodium. Maintain a healthy weight Body mass index (BMI) is a measurement that can be used to identify possible weight problems. It estimates body fat based on height and weight. Your health care provider can help determine your BMI and help you achieve or maintain a healthy weight. Get regular exercise Get regular exercise. This is one of the most important things you can do for your health. Most adults should: Exercise for at least 150 minutes each week. The exercise should increase your heart rate and make you sweat (moderate-intensity exercise). Do strengthening exercises at least twice a week. This is in addition to the moderate-intensity exercise. Spend less time sitting. Even light physical activity can be beneficial. Watch cholesterol and blood lipids Have your blood tested for lipids and cholesterol at 50 years of age, then have this test every 5 years. You may need to have your cholesterol levels checked more often if: Your lipid or cholesterol levels are high. You are older than 50 years of age. You are at high risk for heart disease. What should I know about cancer screening? Many types of cancers can be detected early and may often be prevented. Depending on your health history and family history, you may need to have cancer screening at various ages. This may include screening for: Colorectal cancer. Prostate cancer. Skin cancer. Lung  cancer. What should I know about heart disease, diabetes, and high blood pressure? Blood pressure and heart disease High blood pressure causes heart disease and increases the risk of stroke. This is more likely to develop in people who have high blood pressure readings or are overweight. Talk with your health care provider about your target blood pressure readings. Have your blood pressure checked: Every 3-5 years if you are 18-39 years of age. Every year if you are 40 years old or older. If you are between the ages of 65 and 75 and are a current or former smoker, ask your health care provider if you should have a one-time screening for abdominal aortic aneurysm (AAA). Diabetes Have regular diabetes screenings. This checks your fasting blood sugar level. Have the screening done: Once every three years after age 45 if you are at a normal weight and have a low risk for diabetes. More often and at a younger age if you are overweight or have a high risk for diabetes. What should I know about preventing infection? Hepatitis B If you have a higher risk for hepatitis B, you should be screened for this virus. Talk with your health care provider to find out if you are at risk for hepatitis B infection. Hepatitis C Blood testing is recommended for: Everyone born from 1945 through 1965. Anyone with known risk factors for hepatitis C. Sexually transmitted infections (STIs) You should be screened each year for STIs, including gonorrhea and chlamydia, if: You are sexually active and are younger than 50 years of age. You are older than 50 years of age and your   health care provider tells you that you are at risk for this type of infection. Your sexual activity has changed since you were last screened, and you are at increased risk for chlamydia or gonorrhea. Ask your health care provider if you are at risk. Ask your health care provider about whether you are at high risk for HIV. Your health care provider  may recommend a prescription medicine to help prevent HIV infection. If you choose to take medicine to prevent HIV, you should first get tested for HIV. You should then be tested every 3 months for as long as you are taking the medicine. Follow these instructions at home: Alcohol use Do not drink alcohol if your health care provider tells you not to drink. If you drink alcohol: Limit how much you have to 0-2 drinks a day. Know how much alcohol is in your drink. In the [place] one drink equals one 12 oz bottle of beer (355 mL), one 5 oz glass of wine (148 mL), or one 1 oz glass of hard liquor (44 mL). Lifestyle Do not use any products that contain nicotine or tobacco. These products include cigarettes, chewing tobacco, and vaping devices, such as e-cigarettes. If you need help quitting, ask your health care provider. Do not use street drugs. Do not share needles. Ask your health care provider for help if you need support or information about quitting drugs. General instructions Schedule regular health, dental, and eye exams. Stay current with your vaccines. Tell your health care provider if: You often feel depressed. You have ever been abused or do not feel safe at home. Summary Adopting a healthy lifestyle and getting preventive care are important in promoting health and wellness. Follow your health care provider's instructions about healthy diet, exercising, and getting tested or screened for diseases. Follow your health care provider's instructions on monitoring your cholesterol and blood pressure. This information is not intended to replace advice given to you by your health care provider. Make sure you discuss any questions you have with your health care provider. Document Revised: 01/22/2021 Document Reviewed: 01/22/2021 Elsevier Patient Education  2023 Elsevier Inc.  

## 2022-07-18 NOTE — Assessment & Plan Note (Signed)
Encouraged high-protein, high-calorie diet

## 2022-07-18 NOTE — Telephone Encounter (Signed)
Gastroenterology Pre-Procedure Review  Patient requested to contact his wife once we get cardiac clearance to schedule. Cardiac Clearance sent to Dr.  Quay Burow  Request Date: TBD (Any day but Friday)  Requesting Physician: Dr. Dellie Catholic  PATIENT REVIEW QUESTIONS: The patient responded to the following health history questions as indicated:    1. Are you having any GI issues? no 2. Do you have a personal history of Polyps? no 3. Do you have a family history of Colon Cancer or Polyps? no 4. Diabetes Mellitus? no 5. Joint replacements in the past 12 months?no 6. Major health problems in the past 3 months?no 7. Any artificial heart valves, MVP, or defibrillator?Clearance sent to Dr. Donnella Bi  Cardiology H/O prosthetic aortic valve replacemen      MEDICATIONS & ALLERGIES:    Patient reports the following regarding taking any anticoagulation/antiplatelet therapy:   Plavix, Coumadin, Eliquis, Xarelto, Lovenox, Pradaxa, Brilinta, or Effient? no Aspirin? no  Patient confirms/reports the following medications:  Current Outpatient Medications  Medication Sig Dispense Refill   atorvastatin (LIPITOR) 20 MG tablet TAKE 1 TABLET (20 MG TOTAL) BY MOUTH DAILY. KEEP OFFICE VISIT FOR FUTURE REFILLS. 90 tablet 3   clindamycin (CLEOCIN) 300 MG capsule Take 2 tablets 1 hour prior to dental appointment 2 capsule 3   cyclobenzaprine (FLEXERIL) 10 MG tablet Take 1 tablet (10 mg total) by mouth at bedtime as needed for muscle spasms. 20 tablet 0   No current facility-administered medications for this visit.    Patient confirms/reports the following allergies:  Allergies  Allergen Reactions   Augmentin [Amoxicillin-Pot Clavulanate] Shortness Of Breath and Nausea And Vomiting    No orders of the defined types were placed in this encounter.   AUTHORIZATION INFORMATION Primary Insurance: 1D#: Group #:  Secondary Insurance: 1D#: Group #:  SCHEDULE INFORMATION: Date: TBD Time: Location:

## 2022-07-19 LAB — HEMOGLOBIN A1C
Hgb A1c MFr Bld: 5.6 % of total Hgb (ref <5.7)
Mean Plasma Glucose: 114 mg/dL
eAG (mmol/L): 6.3 mmol/L

## 2022-07-19 LAB — COMPLETE METABOLIC PANEL WITH GFR
AG Ratio: 1.7 (calc) (ref 1.0–2.5)
ALT: 17 U/L (ref 9–46)
AST: 19 U/L (ref 10–35)
Albumin: 4.4 g/dL (ref 3.6–5.1)
Alkaline phosphatase (APISO): 60 U/L (ref 35–144)
BUN: 20 mg/dL (ref 7–25)
CO2: 28 mmol/L (ref 20–32)
Calcium: 9.4 mg/dL (ref 8.6–10.3)
Chloride: 109 mmol/L (ref 98–110)
Creat: 1.09 mg/dL (ref 0.70–1.30)
Globulin: 2.6 g/dL (calc) (ref 1.9–3.7)
Glucose, Bld: 91 mg/dL (ref 65–99)
Potassium: 4.4 mmol/L (ref 3.5–5.3)
Sodium: 144 mmol/L (ref 135–146)
Total Bilirubin: 0.5 mg/dL (ref 0.2–1.2)
Total Protein: 7 g/dL (ref 6.1–8.1)
eGFR: 83 mL/min/{1.73_m2} (ref >=60)

## 2022-07-19 LAB — CBC
HCT: 42.1 % (ref 38.5–50.0)
Hemoglobin: 14.2 g/dL (ref 13.2–17.1)
MCH: 32.8 pg (ref 27.0–33.0)
MCHC: 33.7 g/dL (ref 32.0–36.0)
MCV: 97.2 fL (ref 80.0–100.0)
MPV: 12.4 fL (ref 7.5–12.5)
Platelets: 194 10*3/uL (ref 140–400)
RBC: 4.33 10*6/uL (ref 4.20–5.80)
RDW: 12.2 % (ref 11.0–15.0)
WBC: 8.8 10*3/uL (ref 3.8–10.8)

## 2022-07-19 LAB — LIPID PANEL
Cholesterol: 136 mg/dL (ref <200)
HDL: 49 mg/dL (ref >=40)
LDL Cholesterol (Calc): 74 mg/dL (calc)
Non-HDL Cholesterol (Calc): 87 mg/dL (calc) (ref <130)
Total CHOL/HDL Ratio: 2.8 (calc) (ref <5.0)
Triglycerides: 44 mg/dL (ref <150)

## 2022-07-19 LAB — PSA: PSA: 0.53 ng/mL (ref <=4.00)

## 2022-07-22 ENCOUNTER — Telehealth: Payer: Self-pay

## 2022-07-22 NOTE — Telephone Encounter (Signed)
  Patient Consent for Virtual Visit         Christopher Ball has provided verbal consent on 07/22/2022 for a virtual visit (video or telephone).   CONSENT FOR VIRTUAL VISIT FOR:  Christopher Ball  By participating in this virtual visit I agree to the following:  I hereby voluntarily request, consent and authorize [facility] and its employed or contracted physicians, physician assistants, nurse practitioners or other licensed health care professionals (the Practitioner), to provide me with telemedicine health care services (the "Services") as deemed necessary by the treating Practitioner. I acknowledge and consent to receive the Services by the Practitioner via telemedicine. I understand that the telemedicine visit will involve communicating with the Practitioner through live audiovisual communication technology and the disclosure of certain medical information by electronic transmission. I acknowledge that I have been given the opportunity to request an in-person assessment or other available alternative prior to the telemedicine visit and am voluntarily participating in the telemedicine visit.  I understand that I have the right to withhold or withdraw my consent to the use of telemedicine in the course of my care at any time, without affecting my right to future care or treatment, and that the Practitioner or I may terminate the telemedicine visit at any time. I understand that I have the right to inspect all information obtained and/or recorded in the course of the telemedicine visit and may receive copies of available information for a reasonable fee.  I understand that some of the potential risks of receiving the Services via telemedicine include:  Delay or interruption in medical evaluation due to technological equipment failure or disruption; Information transmitted may not be sufficient (e.g. poor resolution of images) to allow for appropriate medical decision making by the  Practitioner; and/or  In rare instances, security protocols could fail, causing a breach of personal health information.  Furthermore, I acknowledge that it is my responsibility to provide information about my medical history, conditions and care that is complete and accurate to the best of my ability. I acknowledge that Practitioner's advice, recommendations, and/or decision may be based on factors not within their control, such as incomplete or inaccurate data provided by me or distortions of diagnostic images or specimens that may result from electronic transmissions. I understand that the practice of medicine is not an exact science and that Practitioner makes no warranties or guarantees regarding treatment outcomes. I acknowledge that a copy of this consent can be made available to me via my patient portal ([facility]), or I can request a printed copy by calling the office of [facility].    I understand that my insurance will be billed for this visit.   I have read or had this consent read to me. I understand the contents of this consent, which adequately explains the benefits and risks of the Services being provided via telemedicine.  I have been provided ample opportunity to ask questions regarding this consent and the Services and have had my questions answered to my satisfaction. I give my informed consent for the services to be provided through the use of telemedicine in my medical care

## 2022-07-22 NOTE — Telephone Encounter (Signed)
Name: Christopher Ball  DOB: [birth year]  MRN: [id number]  Primary Cardiologist: Nanetta Batty, MD   Preoperative team, please contact this patient and set up a phone call appointment for further preoperative risk assessment. Please obtain consent and complete medication review. Thank you for your help.  I confirm that guidance regarding antiplatelet and oral anticoagulation therapy has been completed and, if necessary, noted below. No request for medication holds.   Ronney Asters, NP 07/22/2022, 11:51 AM [facility]

## 2022-07-22 NOTE — Telephone Encounter (Signed)
Pre-operative Risk Assessment    Patient Name: Christopher Ball  DOB: April 25, 1971 MRN: [id number]      Request for Surgical Clearance    Procedure:   Colonoscopy  Date of Surgery:  Clearance TBD                                 Surgeon:   TBD Surgeon's Group or Practice Name:  [facility] Phone number:  [phone] Fax number:  [phone] Darin Engels, CMA)   Type of Clearance Requested:   - Medical    Type of Anesthesia:  General    Additional requests/questions:   n/a  Signed, Jerrion Tabbert T   07/22/2022, 7:48 AM

## 2022-07-22 NOTE — Telephone Encounter (Signed)
Telehealth appointment scheduled for Monday 07/29/22. Patient agreeable and voiced understanding.

## 2022-07-23 ENCOUNTER — Other Ambulatory Visit: Payer: Self-pay

## 2022-07-23 DIAGNOSIS — Z1211 Encounter for screening for malignant neoplasm of colon: Secondary | ICD-10-CM

## 2022-07-23 MED ORDER — NA SULFATE-K SULFATE-MG SULF 17.5-3.13-1.6 GM/177ML PO SOLN: 1.0000 | Freq: Once | ORAL | 0 refills | Status: AC

## 2022-07-23 NOTE — Addendum Note (Signed)
Addended by: Vanetta Mulders on: 07/23/2022 09:38 AM   Modules accepted: Orders

## 2022-07-23 NOTE — Telephone Encounter (Signed)
Patient has cardiac appt scheduled for 07/29/22 for pre-op clearance.  Colonoscopy scheduled for 08/19/22 [facility] with Dr. Vicente Males.

## 2022-07-25 ENCOUNTER — Ambulatory Visit
Admission: RE | Admit: 2022-07-25 | Discharge: 2022-07-25 | Disposition: A | Discharge status: Home / Self Care | Payer: Federal, State, Local not specified - PPO | Source: Ambulatory Visit | Attending: Internal Medicine | Admitting: Internal Medicine

## 2022-07-25 DIAGNOSIS — R2242 Localized swelling, mass and lump, left lower limb: Secondary | ICD-10-CM

## 2022-07-25 DIAGNOSIS — M7989 Other specified soft tissue disorders: Secondary | ICD-10-CM | POA: Diagnosis not present

## 2022-07-25 DIAGNOSIS — M79651 Pain in right thigh: Secondary | ICD-10-CM | POA: Insufficient documentation

## 2022-07-25 MED ORDER — GADOBUTROL 1 MMOL/ML IV SOLN
6.0000 mL | Freq: Once | INTRAVENOUS | Status: AC | PRN
  Administered 2022-07-25: 6 mL via INTRAVENOUS

## 2022-07-29 ENCOUNTER — Ambulatory Visit: Payer: Federal, State, Local not specified - PPO | Attending: Cardiovascular Disease | Admitting: Physician Assistant

## 2022-07-29 DIAGNOSIS — Z01810 Encounter for preprocedural cardiovascular examination: Secondary | ICD-10-CM | POA: Diagnosis not present

## 2022-07-29 DIAGNOSIS — Z952 Presence of prosthetic heart valve: Secondary | ICD-10-CM | POA: Diagnosis not present

## 2022-07-29 NOTE — Addendum Note (Signed)
Addended by: Michae Kava on: 07/29/2022 10:17 AM   Modules accepted: Orders

## 2022-07-29 NOTE — Progress Notes (Signed)
Virtual Visit via Telephone Note   Because of Christopher Ball's co-morbid illnesses, he is at least at moderate risk for complications without adequate follow up.  This format is felt to be most appropriate for this patient at this time.  The patient did not have access to video technology/had technical difficulties with video requiring transitioning to audio format only (telephone).  All issues noted in this document were discussed and addressed.  No physical exam could be performed with this format.  Please refer to the patient's chart for his consent to telehealth for [facility].  Evaluation Performed:  Preoperative cardiovascular risk assessment _____________   Date:  07/29/2022   Patient ID:  Christopher Ball, DOB Jul 30, 1971, MRN [id number] Patient Location:  Home Provider location:   Office  Primary Care Provider:  Lorre Munroe, NP Primary Cardiologist:  Nanetta Batty, MD  Chief Complaint / Patient Profile   51 y.o. y/o male with a h/o marfans syndrome, thoracic aortic aneurysm, tobacco abuse who is pending colonscopy and presents today for telephonic preoperative cardiovascular risk assessment.  Past Medical History    Past Medical History:  Diagnosis Date   Marfan syndrome    Stomach problems    Thoracic aortic aneurysm (HCC)    replacement by Dr. [name] /30/07 with aortic bioprosthetic valve   Tobacco abuse    Past Surgical History:  Procedure Laterality Date   AORTIC VALVE REPLACEMENT     VASECTOMY  09/16/2002    Allergies  Allergies  Allergen Reactions   Augmentin [Amoxicillin-Pot Clavulanate] Shortness Of Breath and Nausea And Vomiting    History of Present Illness    Christopher Ball is a 51 y.o. male who presents via audio/video conferencing for a telehealth visit today.  Pt was last seen in cardiology clinic on 08/14/2021 by Dr. Allyson Sabal.  At that time Christopher Ball was doing well.  The patient is now pending procedure as outlined  above. Since his last visit, he has not had any issues with his heart.  He denies chest pains, shortness of breath, or palpitations.  His biggest complaint is a hip and leg pain.  He had an x-ray that showed no issues.  He also had an ultrasound which did not show any DVTs.  MRI results were reviewed with the patient and I told him he should be expecting a call from the ordering physician today to go through in further detail.  His hip does limit his activity level but he has scored a 5.62 METS on the DASI.  This exceeds the 4 METS minimum requirement.   No medications indicated as needing held.   Home Medications    Prior to Admission medications   Medication Sig Start Date End Date Taking? Authorizing Provider  atorvastatin (LIPITOR) 20 MG tablet TAKE 1 TABLET (20 MG TOTAL) BY MOUTH DAILY. KEEP OFFICE VISIT FOR FUTURE REFILLS. 12/26/21   [name], MD  clindamycin (CLEOCIN) 300 MG capsule Take 2 tablets 1 hour prior to dental appointment 06/18/22   [name], MD  cyclobenzaprine (FLEXERIL) 10 MG tablet Take 1 tablet (10 mg total) by mouth at bedtime as needed for muscle spasms. Patient not taking: Reported on 07/22/2022 07/10/21   Lorre Munroe, NP  tadalafil (CIALIS) 20 MG tablet Take 20 mg by mouth daily. 04/16/22   [provider]    Physical Exam    Vital Signs:  Christopher Ball does not have vital signs available for review  today.  124/82  Given telephonic nature of communication, physical exam is limited. AAOx3. NAD. Normal affect.  Speech and respirations are unlabored.  Accessory Clinical Findings    None  Assessment & Plan    1.  Preoperative Cardiovascular Risk Assessment:  Christopher Ball perioperative risk of a major cardiac event is 0.4% according to the Revised Cardiac Risk Index (RCRI).  Therefore, he is at low risk for perioperative complications.   His functional capacity is good at 5.62 METs according to the [facility] Activity Status Index  (DASI). Recommendations: The patient requires an echocardiogram before a disposition can be made regarding surgical risk.                 A copy of this note will be routed to requesting surgeon.  Time:   Today, I have spent 10 minutes with the patient with telehealth technology discussing medical history, symptoms, and management plan.    Sharlene Dory, PA-C  07/29/2022, 9:48 AM

## 2022-07-29 NOTE — Addendum Note (Signed)
Addended by: Elgie Collard on: 07/29/2022 10:24 AM   Modules accepted: Orders

## 2022-08-06 ENCOUNTER — Ambulatory Visit (HOSPITAL_COMMUNITY): Payer: Federal, State, Local not specified - PPO | Attending: Physician Assistant

## 2022-08-06 DIAGNOSIS — Z952 Presence of prosthetic heart valve: Secondary | ICD-10-CM

## 2022-08-06 DIAGNOSIS — Z01810 Encounter for preprocedural cardiovascular examination: Secondary | ICD-10-CM

## 2022-08-06 LAB — ECHOCARDIOGRAM COMPLETE
AR max vel: 1.23 cm2
AV Area VTI: 1.33 cm2
AV Area mean vel: 1.25 cm2
AV Mean grad: 18.8 mmHg
AV Peak grad: 36.4 mmHg
Ao pk vel: 3.02 m/s
Area-P 1/2: 3.72 cm2
S' Lateral: 3 cm

## 2022-08-16 ENCOUNTER — Encounter: Payer: Self-pay | Admitting: Gastroenterology

## 2022-08-19 ENCOUNTER — Encounter
Admission: RE | Disposition: A | Discharge status: Home / Self Care | Payer: Self-pay | Source: Home / Self Care | Attending: Gastroenterology

## 2022-08-19 ENCOUNTER — Ambulatory Visit
Admission: RE | Admit: 2022-08-19 | Discharge: 2022-08-19 | Disposition: A | Discharge status: Home / Self Care | Payer: Federal, State, Local not specified - PPO | Attending: Gastroenterology | Admitting: Gastroenterology

## 2022-08-19 ENCOUNTER — Encounter: Payer: Self-pay | Admitting: Gastroenterology

## 2022-08-19 ENCOUNTER — Ambulatory Visit: Payer: Federal, State, Local not specified - PPO | Admitting: Anesthesiology

## 2022-08-19 DIAGNOSIS — Z952 Presence of prosthetic heart valve: Secondary | ICD-10-CM | POA: Insufficient documentation

## 2022-08-19 DIAGNOSIS — E785 Hyperlipidemia, unspecified: Secondary | ICD-10-CM | POA: Diagnosis not present

## 2022-08-19 DIAGNOSIS — Z1211 Encounter for screening for malignant neoplasm of colon: Secondary | ICD-10-CM

## 2022-08-19 DIAGNOSIS — K635 Polyp of colon: Secondary | ICD-10-CM | POA: Diagnosis not present

## 2022-08-19 DIAGNOSIS — Z955 Presence of coronary angioplasty implant and graft: Secondary | ICD-10-CM | POA: Insufficient documentation

## 2022-08-19 DIAGNOSIS — I712 Thoracic aortic aneurysm, without rupture, unspecified: Secondary | ICD-10-CM | POA: Diagnosis not present

## 2022-08-19 DIAGNOSIS — F17210 Nicotine dependence, cigarettes, uncomplicated: Secondary | ICD-10-CM | POA: Diagnosis not present

## 2022-08-19 DIAGNOSIS — D126 Benign neoplasm of colon, unspecified: Secondary | ICD-10-CM | POA: Diagnosis not present

## 2022-08-19 DIAGNOSIS — Z9852 Vasectomy status: Secondary | ICD-10-CM | POA: Diagnosis not present

## 2022-08-19 DIAGNOSIS — Q8740 Marfan's syndrome, unspecified: Secondary | ICD-10-CM | POA: Insufficient documentation

## 2022-08-19 HISTORY — PX: COLONOSCOPY WITH PROPOFOL: SHX5780

## 2022-08-19 SURGERY — COLONOSCOPY WITH PROPOFOL
Anesthesia: General

## 2022-08-19 MED ORDER — SODIUM CHLORIDE 0.9 % IV SOLN: INTRAVENOUS | Status: DC

## 2022-08-19 MED ORDER — PROPOFOL 500 MG/50ML IV EMUL
INTRAVENOUS | Status: DC | PRN
  Administered 2022-08-19: 150 ug/kg/min via INTRAVENOUS

## 2022-08-19 MED ORDER — PROPOFOL 10 MG/ML IV BOLUS
INTRAVENOUS | Status: DC | PRN
  Administered 2022-08-19 (×3): 50 mg via INTRAVENOUS

## 2022-08-19 MED ORDER — LIDOCAINE HCL (CARDIAC) PF 100 MG/5ML IV SOSY
PREFILLED_SYRINGE | INTRAVENOUS | Status: DC | PRN
  Administered 2022-08-19 (×2): 50 mg via INTRAVENOUS

## 2022-08-19 MED ORDER — GLYCOPYRROLATE 0.2 MG/ML IJ SOLN
INTRAMUSCULAR | Status: DC | PRN
  Administered 2022-08-19: .1 mg via INTRAVENOUS

## 2022-08-19 NOTE — Anesthesia Preprocedure Evaluation (Signed)
Anesthesia Evaluation  Patient identified by MRN, date of birth, ID band Patient awake    Reviewed: Allergy & Precautions, NPO status , Patient's Chart, lab work & pertinent test results  History of Anesthesia Complications (+) AWARENESS UNDER ANESTHESIA and history of anesthetic complications  Airway Mallampati: II  TM Distance: >3 FB Neck ROM: full    Dental  (+) Chipped   Pulmonary neg pulmonary ROS, Current Smoker   Pulmonary exam normal        Cardiovascular Exercise Tolerance: Good (-) angina + Cardiac Stents  (-) DOE Normal cardiovascular exam  IMPRESSIONS     1. S/P bioprothetic AVR with restricted right and left cusps; trace AI;  mildly elevated mean gradient of 19 mmHg; AVA 1.3 cm2. Compared  to12/20/22, mean gradient has increased.   2. Left ventricular ejection fraction, by estimation, is 60 to 65%. The  left ventricle has normal function. The left ventricle has no regional  wall motion abnormalities. Left ventricular diastolic parameters were  normal.   3. Right ventricular systolic function is normal. The right ventricular  size is normal.   4. The mitral valve is normal in structure. Trivial mitral valve  regurgitation. No evidence of mitral stenosis.   5. The aortic valve has been repaired/replaced. Aortic valve  regurgitation is trivial. Mild aortic valve stenosis. There is a 27 mm  [employer] bioprosthetic valve present in the aortic position. Procedure  Date: 01/13/06.   6. Aortic dilatation noted. 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   7. The inferior vena cava is normal in size with greater than 50%  respiratory variability, suggesting right atrial pressure of 3 mmHg.     Neuro/Psych negative neurological ROS  negative psych ROS   GI/Hepatic negative GI ROS, Neg liver ROS,,,  Endo/Other  negative endocrine ROS    Renal/GU negative Renal ROS  negative  genitourinary   Musculoskeletal   Abdominal   Peds  Hematology negative hematology ROS (+)   Anesthesia Other Findings Past Medical History: No date: Marfan syndrome No date: Stomach problems No date: Thoracic aortic aneurysm [facility]     Comment:  replacement by Dr. Valentina Lucks /30/07 with aortic               bioprosthetic valve No date: Tobacco abuse  Past Surgical History: No date: AORTIC VALVE REPLACEMENT 09/16/2002: VASECTOMY  BMI    Body Mass Index: 17.47 kg/m      Reproductive/Obstetrics negative OB ROS                             Anesthesia Physical Anesthesia Plan  ASA: 3  Anesthesia Plan: General   Post-op Pain Management: Minimal or no pain anticipated   Induction: Intravenous  PONV Risk Score and Plan: Propofol infusion and TIVA  Airway Management Planned: Natural Airway and Nasal Cannula  Additional Equipment:   Intra-op Plan:   Post-operative Plan:   Informed Consent: I have reviewed the patients History and Physical, chart, labs and discussed the procedure including the risks, benefits and alternatives for the proposed anesthesia with the patient or authorized representative who has indicated his/her understanding and acceptance.     Dental Advisory Given  Plan Discussed with: Anesthesiologist, CRNA and Surgeon  Anesthesia Plan Comments: (Patient consented for risks of anesthesia including but not limited to:  - adverse reactions to medications - risk of airway placement if required - damage to eyes, teeth,  lips or other oral mucosa - nerve damage due to positioning  - sore throat or hoarseness - Damage to heart, brain, nerves, lungs, other parts of body or loss of life  Patient voiced understanding.)        Anesthesia Quick Evaluation

## 2022-08-19 NOTE — Transfer of Care (Signed)
Immediate Anesthesia Transfer of Care Note  Patient: Christopher Ball  Procedure(s) Performed: COLONOSCOPY WITH PROPOFOL  Patient Location: PACU  Anesthesia Type:General  Level of Consciousness: awake, alert , oriented, and patient cooperative  Airway & Oxygen Therapy: Patient Spontanous Breathing and Patient connected to nasal cannula oxygen  Post-op Assessment: Report given to RN and Post -op Vital signs reviewed and stable  Post vital signs: Reviewed and stable  Last Vitals:  Vitals Value Taken Time  BP    Temp    Pulse    Resp    SpO2      Last Pain:  Vitals:   08/19/22 0949  TempSrc: Temporal         Complications: No notable events documented.

## 2022-08-19 NOTE — Anesthesia Postprocedure Evaluation (Signed)
Anesthesia Post Note  Patient: Christopher Ball  Procedure(s) Performed: COLONOSCOPY WITH PROPOFOL  Patient location during evaluation: Endoscopy Anesthesia Type: General Level of consciousness: awake and alert Pain management: pain level controlled Vital Signs Assessment: post-procedure vital signs reviewed and stable Respiratory status: spontaneous breathing, nonlabored ventilation, respiratory function stable and patient connected to nasal cannula oxygen Cardiovascular status: blood pressure returned to baseline and stable Postop Assessment: no apparent nausea or vomiting Anesthetic complications: no   No notable events documented.   Last Vitals:  Vitals:   08/19/22 1054 08/19/22 1114  BP: 97/72 121/67  Pulse: 70 69  Resp: (!) 23 (!) 21  Temp: (!) 35.7 C   SpO2: 98% 100%    Last Pain:  Vitals:   08/19/22 1054  TempSrc: Temporal                 Ilene Qua

## 2022-08-19 NOTE — Op Note (Signed)
[facility] Gastroenterology Patient Name: Christopher Ball Procedure Date: 08/19/2022 10:25 AM MRN: [id number] Account #: [id number] Date of Birth: [birth year] Admit Type: Outpatient Age: 51 Room: [facility] ENDO ROOM 1 Gender: Male Note Status: Finalized Instrument Name: Prentice Docker [id number] Procedure:             Colonoscopy Indications:           Screening for colorectal malignant neoplasm Providers:             Wyline Mood MD, MD Referring MD:          Lorre Munroe (Referring MD) Medicines:             Monitored Anesthesia Care Complications:         No immediate complications. Procedure:             Pre-Anesthesia Assessment:                        - Prior to the procedure, a History and Physical was                         performed, and patient medications, allergies and                         sensitivities were reviewed. The patient's tolerance                         of previous anesthesia was reviewed.                        - The risks and benefits of the procedure and the                         sedation options and risks were discussed with the                         patient. All questions were answered and informed                         consent was obtained.                        - ASA Grade Assessment: II - A patient with mild                         systemic disease.                        - After reviewing the risks and benefits, the patient                         was deemed in satisfactory condition to undergo the                         procedure.                        After obtaining informed consent, the colonoscope was                         passed under direct vision. Throughout  the procedure,                         the patient's blood pressure, pulse, and oxygen                         saturations were monitored continuously. The                         Colonoscope was introduced through the anus and                         advanced to  the the cecum, identified by the                         appendiceal orifice. The colonoscopy was performed                         with ease. The patient tolerated the procedure well.                         The quality of the bowel preparation was excellent.                         The appendiceal orifice was photographed. Findings:      The perianal and digital rectal examinations were normal.      A 10 mm polyp was found in the ascending colon. The polyp was sessile.       The polyp was removed with a cold snare. Resection was complete, but the       polyp tissue was not retrieved. To prevent bleeding after the       polypectomy, one hemostatic clip was successfully placed. There was no       bleeding at the end of the procedure.      The exam was otherwise without abnormality. Impression:            - One 10 mm polyp in the ascending colon, removed with                         a cold snare. Complete resection. Polyp tissue not                         retrieved. Clip was placed.                        - The examination was otherwise normal. Recommendation:        - Discharge patient to home (with escort).                        - Resume previous diet.                        - Continue present medications.                        - Repeat colonoscopy in 3 years for surveillance. Procedure Code(s):     --- Professional ---  47829, Colonoscopy, flexible; with removal of                         tumor(s), polyp(s), or other lesion(s) by snare                         technique Diagnosis Code(s):     --- Professional ---                        Z12.11, Encounter for screening for malignant neoplasm                         of colon                        D12.2, Benign neoplasm of ascending colon CPT copyright 2022 American Medical Association. All rights reserved. The codes documented in this report are preliminary and upon coder review may  be revised to meet current  compliance requirements. Wyline Mood, MD Wyline Mood MD, MD 08/19/2022 10:53:02 AM This report has been signed electronically. Number of Addenda: 0 Note Initiated On: 08/19/2022 10:25 AM Scope Withdrawal Time: 0 hours 10 minutes 18 seconds  Total Procedure Duration: 0 hours 12 minutes 22 seconds  Estimated Blood Loss:  Estimated blood loss: none.      [facility]

## 2022-08-19 NOTE — H&P (Signed)
Christopher Bellows, MD [address] [place], [place], [place], [address] [address], [place], [place], [place], [address] Phone: [phone]  Fax: [phone]  Primary Care Physician:  Jearld Fenton, NP   Pre-Procedure History & Physical: HPI:  Christopher Ball is a 51 y.o. male is here for an colonoscopy.   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 Authorizing Provider  atorvastatin (LIPITOR) 20 MG tablet TAKE 1 TABLET (20 MG TOTAL) BY MOUTH DAILY. KEEP OFFICE VISIT FOR FUTURE REFILLS. 12/26/21  Yes Lorretta Harp, MD  clindamycin (CLEOCIN) 300 MG capsule Take 2 tablets 1 hour prior to dental appointment 06/18/22   Lorretta Harp, MD  cyclobenzaprine (FLEXERIL) 10 MG tablet Take 1 tablet (10 mg total) by mouth at bedtime as needed for muscle spasms. Patient not taking: Reported on 07/22/2022 07/10/21   Jearld Fenton, NP  tadalafil (CIALIS) 20 MG tablet Take 20 mg by mouth daily. 04/16/22   [provider]    Allergies as of 07/23/2022 - Review Complete 07/18/2022  Allergen Reaction Noted   Augmentin [amoxicillin-pot clavulanate] Shortness Of Breath and Nausea And Vomiting 06/24/2012    Family History  Problem Relation Age of Onset   Asthma Mother    Early death Father    Heart disease Father    Asthma Brother    Diabetes Maternal Grandmother    Cancer Neg Hx    Stroke Neg Hx    Kidney disease Neg Hx    Hypertension Neg Hx    Hyperlipidemia Neg Hx     Social History   Socioeconomic History   Marital status: Married    Spouse name: Not on file   Number of children: Not on file   Years of education:  Not on file   Highest education level: Not on file  Occupational History   Not on file  Tobacco Use   Smoking status: Every Day    Packs/day: 1.00    Years: 20.00    Total pack years: 20.00    Types: Cigars, Cigarettes   Smokeless tobacco: Never  Vaping Use   Vaping Use: Every day  Substance and Sexual Activity   Alcohol use: Yes    Alcohol/week: 1.0 standard drink of alcohol    Types: 1 Shots of liquor per week    Comment: rare   Drug use: Yes    Types: Marijuana   Sexual activity: Yes  Other Topics Concern   Not on file  Social History Narrative   Not on file   Social Determinants of Health   Financial Resource Strain: Not on file  Food Insecurity: Not on file  Transportation Needs: Not on file  Physical Activity: Not on file  Stress: Not on file  Social Connections: Not on file  Intimate Partner Violence: Not on file    Review of Systems: See HPI, otherwise negative ROS  Physical Exam: There were no vitals taken for this visit. General:   Alert,  pleasant and cooperative in NAD Head:  Normocephalic and atraumatic. Neck:  Supple; no masses  or thyromegaly. Lungs:  Clear throughout to auscultation, normal respiratory effort.    Heart:  +S1, +S2, Regular rate and rhythm, No edema. Abdomen:  Soft, nontender and nondistended. Normal bowel sounds, without guarding, and without rebound.   Neurologic:  Alert and  oriented x4;  grossly normal neurologically.  Impression/Plan: [name] is here for an colonoscopy to be performed for Screening colonoscopy average risk   Risks, benefits, limitations, and alternatives regarding  colonoscopy have been reviewed with the patient.  Questions have been answered.  All parties agreeable.   Christopher Bellows, MD  08/19/2022, 10:01 AM

## 2022-08-20 ENCOUNTER — Encounter: Payer: Self-pay | Admitting: Gastroenterology

## 2022-09-17 ENCOUNTER — Encounter: Payer: Self-pay | Admitting: *Deleted

## 2022-09-24 ENCOUNTER — Other Ambulatory Visit: Payer: Self-pay | Admitting: *Deleted

## 2022-09-24 DIAGNOSIS — F17210 Nicotine dependence, cigarettes, uncomplicated: Secondary | ICD-10-CM

## 2022-09-24 DIAGNOSIS — Z122 Encounter for screening for malignant neoplasm of respiratory organs: Secondary | ICD-10-CM

## 2022-09-24 DIAGNOSIS — Z87891 Personal history of nicotine dependence: Secondary | ICD-10-CM

## 2022-10-31 DIAGNOSIS — H35413 Lattice degeneration of retina, bilateral: Secondary | ICD-10-CM | POA: Diagnosis not present

## 2022-10-31 DIAGNOSIS — H25812 Combined forms of age-related cataract, left eye: Secondary | ICD-10-CM | POA: Diagnosis not present

## 2022-10-31 DIAGNOSIS — Q8740 Marfan's syndrome, unspecified: Secondary | ICD-10-CM | POA: Diagnosis not present

## 2022-11-05 ENCOUNTER — Encounter: Payer: Self-pay | Admitting: Acute Care

## 2022-11-05 ENCOUNTER — Ambulatory Visit (INDEPENDENT_AMBULATORY_CARE_PROVIDER_SITE_OTHER): Payer: Federal, State, Local not specified - PPO | Admitting: Acute Care

## 2022-11-05 DIAGNOSIS — F17210 Nicotine dependence, cigarettes, uncomplicated: Secondary | ICD-10-CM | POA: Diagnosis not present

## 2022-11-05 NOTE — Patient Instructions (Signed)
Thank you for participating in the [facility]. It was our pleasure to meet you today. We will call you with the results of your scan within the next few days. Your scan will be assigned a Lung RADS category score by the physicians reading the scans.  This Lung RADS score determines follow up scanning.  See below for description of categories, and follow up screening recommendations. We will be in touch to schedule your follow up screening annually or based on recommendations of our providers. We will fax a copy of your scan results to your Primary Care Physician, or the physician who referred you to the program, to ensure they have the results. Please call the office if you have any questions or concerns regarding your scanning experience or results.  Our office number is [phone]. Please speak with Denise Phelps, RN. , or  Denise Buckner RN, They are  our Lung Cancer Screening RN.'s If They are unavailable when you call, Please leave a message on the voice mail. We will return your call at our earliest convenience.This voice mail is monitored several times a day.  Remember, if your scan is normal, we will scan you annually as long as you continue to meet the criteria for the program. (Age 50-80, Current smoker or smoker who has quit within the last 15 years). If you are a smoker, remember, quitting is the single most powerful action that you can take to decrease your risk of lung cancer and other pulmonary, breathing related problems. We know quitting is hard, and we are here to help.  Please let us know if there is anything we can do to help you meet your goal of quitting. If you are a former smoker, congratulations. We are proud of you! Remain smoke free! Remember you can refer friends or family members through the number above.  We will screen them to make sure they meet criteria for the program. Thank you for helping us take better care of you by  participating in Lung Screening.  You can receive free nicotine replacement therapy ( patches, gum or mints) by calling 1-800-QUIT NOW. Please call so we can get you on the path to becoming  a non-smoker. I know it is hard, but you can do this!  Lung RADS Categories:  Lung RADS 1: no nodules or definitely non-concerning nodules.  Recommendation is for a repeat annual scan in 12 months.  Lung RADS 2:  nodules that are non-concerning in appearance and behavior with a very low likelihood of becoming an active cancer. Recommendation is for a repeat annual scan in 12 months.  Lung RADS 3: nodules that are probably non-concerning , includes nodules with a low likelihood of becoming an active cancer.  Recommendation is for a 6-month repeat screening scan. Often noted after an upper respiratory illness. We will be in touch to make sure you have no questions, and to schedule your 6-month scan.  Lung RADS 4 A: nodules with concerning findings, recommendation is most often for a follow up scan in 3 months or additional testing based on our provider's assessment of the scan. We will be in touch to make sure you have no questions and to schedule the recommended 3 month follow up scan.  Lung RADS 4 B:  indicates findings that are concerning. We will be in touch with you to schedule additional diagnostic testing based on our provider's  assessment of the scan.  Other options for assistance in smoking cessation (   As covered by your insurance benefits)  Hypnosis for smoking cessation  [facility] [employer] [phone]  Acupuncture for smoking cessation  [facility] [phone]   

## 2022-11-05 NOTE — Progress Notes (Signed)
Virtual Visit via Video Note  I connected with Christopher Ball on 11/05/22 at  3:30 PM EST by a video enabled telemedicine application and verified that I am speaking with the correct person using two identifiers.  Location: Patient:  At home Provider:  [address] [place], [place]   I discussed the limitations of evaluation and management by telemedicine and the availability of in person appointments. The patient expressed understanding and agreed to proceed.  Shared Decision Making Visit Lung Cancer Screening Program [phone])   Eligibility: Age 52 y.o. Pack Years Smoking History Calculation 31 pack year smoking history (# packs/per year x # years smoked) Recent History of coughing up blood  no Unexplained weight loss? no ( >Than 15 pounds within the last 6 months ) Prior History Lung / other cancer no (Diagnosis within the last 5 years already requiring surveillance chest CT Scans). Smoking Status Current Smoker Former Smokers: Years since quit:  NA  Quit Date:  NA  Visit Components: Discussion included one or more decision making aids. yes Discussion included risk/benefits of screening. yes Discussion included potential follow up diagnostic testing for abnormal scans. yes Discussion included meaning and risk of over diagnosis. yes Discussion included meaning and risk of False Positives. yes Discussion included meaning of total radiation exposure. yes  Counseling Included: Importance of adherence to annual lung cancer LDCT screening. yes Impact of comorbidities on ability to participate in the program. yes Ability and willingness to under diagnostic treatment. yes  Smoking Cessation Counseling: Current Smokers:  Discussed importance of smoking cessation. yes Information about tobacco cessation classes and interventions provided to patient. yes Patient provided with "ticket" for LDCT Scan. yes Symptomatic Patient. no  Counseling NA Diagnosis Code: Tobacco Use  Z72.0 Asymptomatic Patient yes  Counseling (Intermediate counseling: > three minutes counseling) [id number] Former Smokers:  Discussed the importance of maintaining cigarette abstinence. yes Diagnosis Code: Personal History of Nicotine Dependence. [id number] Information about tobacco cessation classes and interventions provided to patient. Yes Patient provided with "ticket" for LDCT Scan. yes Written Order for Lung Cancer Screening with LDCT placed in Epic. Yes (CT Chest Lung Cancer Screening Low Dose W/O CM) [id number] Z12.2-Screening of respiratory organs Z87.891-Personal history of nicotine dependence  I have spent 25 minutes of face to face/ virtual visit   time with  Christopher Ball discussing the risks and benefits of lung cancer screening. We viewed / discussed a power point together that explained in detail the above noted topics. We paused at intervals to allow for questions to be asked and answered to ensure understanding.We discussed that the single most powerful action that he can take to decrease his risk of developing lung cancer is to quit smoking. We discussed whether or not he is ready to commit to setting a quit date. We discussed options for tools to aid in quitting smoking including nicotine replacement therapy, non-nicotine medications, support groups, Quit Smart classes, and behavior modification. We discussed that often times setting smaller, more achievable goals, such as eliminating 1 cigarette a day for a week and then 2 cigarettes a day for a week can be helpful in slowly decreasing the number of cigarettes smoked. This allows for a sense of accomplishment as well as providing a clinical benefit. I provided  him  with smoking cessation  information  with contact information for community resources, classes, free nicotine replacement therapy, and access to mobile apps, text messaging, and on-line smoking cessation help. I have also provided  him  the office  contact information in the event he  needs to contact me, or the screening staff. We discussed the time and location of the scan, and that either Doroteo Glassman RN, Joella Prince, RN  or I will call / send a letter with the results within 24-72 hours of receiving them. The patient verbalized understanding of all of  the above and had no further questions upon leaving the office. They have my contact information in the event they have any further questions.  I spent 3 minutes counseling on smoking cessation and the health risks of continued tobacco abuse.  I explained to the patient that there has been a high incidence of coronary artery disease noted on these exams. I explained that this is a non-gated exam therefore degree or severity cannot be determined. This patient is on statin therapy. I have asked the patient to follow-up with their PCP regarding any incidental finding of coronary artery disease and management with diet or medication as their PCP  feels is clinically indicated. The patient verbalized understanding of the above and had no further questions upon completion of the visit.      Magdalen Spatz, NP 11/05/2022

## 2022-11-06 ENCOUNTER — Ambulatory Visit
Admission: RE | Admit: 2022-11-06 | Discharge: 2022-11-06 | Disposition: A | Discharge status: Home / Self Care | Payer: Federal, State, Local not specified - PPO | Source: Ambulatory Visit | Attending: Acute Care | Admitting: Acute Care

## 2022-11-06 DIAGNOSIS — Z87891 Personal history of nicotine dependence: Secondary | ICD-10-CM | POA: Insufficient documentation

## 2022-11-06 DIAGNOSIS — F17210 Nicotine dependence, cigarettes, uncomplicated: Secondary | ICD-10-CM | POA: Insufficient documentation

## 2022-11-06 DIAGNOSIS — Z122 Encounter for screening for malignant neoplasm of respiratory organs: Secondary | ICD-10-CM | POA: Insufficient documentation

## 2022-11-07 ENCOUNTER — Other Ambulatory Visit: Payer: Self-pay | Admitting: Acute Care

## 2022-11-07 DIAGNOSIS — F17210 Nicotine dependence, cigarettes, uncomplicated: Secondary | ICD-10-CM

## 2022-11-07 DIAGNOSIS — Z87891 Personal history of nicotine dependence: Secondary | ICD-10-CM

## 2022-11-07 DIAGNOSIS — Z122 Encounter for screening for malignant neoplasm of respiratory organs: Secondary | ICD-10-CM

## 2022-11-19 DIAGNOSIS — Q8740 Marfan's syndrome, unspecified: Secondary | ICD-10-CM | POA: Diagnosis not present

## 2022-11-19 DIAGNOSIS — H25812 Combined forms of age-related cataract, left eye: Secondary | ICD-10-CM | POA: Diagnosis not present

## 2022-11-29 DIAGNOSIS — H2511 Age-related nuclear cataract, right eye: Secondary | ICD-10-CM | POA: Diagnosis not present

## 2022-12-03 DIAGNOSIS — H25811 Combined forms of age-related cataract, right eye: Secondary | ICD-10-CM | POA: Diagnosis not present

## 2022-12-03 DIAGNOSIS — Q8740 Marfan's syndrome, unspecified: Secondary | ICD-10-CM | POA: Diagnosis not present

## 2022-12-12 ENCOUNTER — Other Ambulatory Visit: Payer: Self-pay | Admitting: Cardiovascular Disease

## 2022-12-12 DIAGNOSIS — E7800 Pure hypercholesterolemia, unspecified: Secondary | ICD-10-CM

## 2022-12-17 DIAGNOSIS — N486 Induration penis plastica: Secondary | ICD-10-CM | POA: Diagnosis not present

## 2022-12-17 DIAGNOSIS — N5201 Erectile dysfunction due to arterial insufficiency: Secondary | ICD-10-CM | POA: Diagnosis not present

## 2023-01-16 ENCOUNTER — Encounter: Payer: Self-pay | Admitting: Internal Medicine

## 2023-01-16 ENCOUNTER — Ambulatory Visit (INDEPENDENT_AMBULATORY_CARE_PROVIDER_SITE_OTHER): Payer: Federal, State, Local not specified - PPO | Admitting: Internal Medicine

## 2023-01-16 VITALS — BP 120/82 | HR 63 | Ht 78.0 in | Wt 156.0 lb

## 2023-01-16 DIAGNOSIS — Q8740 Marfan's syndrome, unspecified: Secondary | ICD-10-CM | POA: Diagnosis not present

## 2023-01-16 DIAGNOSIS — J439 Emphysema, unspecified: Secondary | ICD-10-CM

## 2023-01-16 DIAGNOSIS — I700 Atherosclerosis of aorta: Secondary | ICD-10-CM

## 2023-01-16 DIAGNOSIS — N486 Induration penis plastica: Secondary | ICD-10-CM | POA: Diagnosis not present

## 2023-01-16 DIAGNOSIS — E782 Mixed hyperlipidemia: Secondary | ICD-10-CM

## 2023-01-16 DIAGNOSIS — R7309 Other abnormal glucose: Secondary | ICD-10-CM

## 2023-01-16 DIAGNOSIS — R636 Underweight: Secondary | ICD-10-CM

## 2023-01-16 MED ORDER — VARENICLINE TARTRATE (STARTER) 0.5 MG X 11 & 1 MG X 42 PO TBPK: ORAL_TABLET | ORAL | 0 refills | Status: DC

## 2023-01-16 NOTE — Assessment & Plan Note (Signed)
Not currently being treated with medication

## 2023-01-16 NOTE — Assessment & Plan Note (Signed)
C-Met and lipid profile today Encouraged to consume low-fat diet Continue atorvastatin Discussed aspirin 81 mg with cardiology

## 2023-01-16 NOTE — Patient Instructions (Signed)

## 2023-01-16 NOTE — Assessment & Plan Note (Signed)
Encourage high-protein high calorie diet

## 2023-01-16 NOTE — Progress Notes (Signed)
Subjective:    Patient ID: Christopher Ball, male    DOB: Sep 08, 1971, 52 y.o.   MRN: [id number]  HPI  Patient presents to clinic today for 19-month follow-up of chronic conditions.  Marfan Syndrome: He denies any issues at this time other than generalized pain.  He is not following with genetics.  HLD with Aortic Atherosclerosis: His last LDL was 74, triglycerides 44, 07/2022.  He denies myalgias on Atorvastatin.  He does not consume low-fat diet.  Emphysema: He denies chronic cough but does have shortness of breath with exertion. He is interested in quitting smoking. He has tried Chantix in the past.   Peyronie's Disease: He takes Cialis as prescribed.  He follows with urology.  Review of Systems     Past Medical History:  Diagnosis Date   Marfan syndrome    Stomach problems    Thoracic aortic aneurysm (HCC)    replacement by Dr. [name] /30/07 with aortic bioprosthetic valve   Tobacco abuse     Current Outpatient Medications  Medication Sig Dispense Refill   atorvastatin (LIPITOR) 20 MG tablet TAKE 1 TABLET (20 MG TOTAL) BY MOUTH DAILY. KEEP OFFICE VISIT FOR FUTURE REFILLS. 90 tablet 3   clindamycin (CLEOCIN) 300 MG capsule Take 2 tablets 1 hour prior to dental appointment 2 capsule 3   cyclobenzaprine (FLEXERIL) 10 MG tablet Take 1 tablet (10 mg total) by mouth at bedtime as needed for muscle spasms. (Patient not taking: Reported on 07/22/2022) 20 tablet 0   tadalafil (CIALIS) 20 MG tablet Take 20 mg by mouth daily.     No current facility-administered medications for this visit.    Allergies  Allergen Reactions   Augmentin [Amoxicillin-Pot Clavulanate] Shortness Of Breath and Nausea And Vomiting    Family History  Problem Relation Age of Onset   Asthma Mother    Early death Father    Heart disease Father    Asthma Brother    Diabetes Maternal Grandmother    Cancer Neg Hx    Stroke Neg Hx    Kidney disease Neg Hx    Hypertension Neg Hx    Hyperlipidemia Neg  Hx     Social History   Socioeconomic History   Marital status: Married    Spouse name: Not on file   Number of children: Not on file   Years of education: Not on file   Highest education level: Not on file  Occupational History   Not on file  Tobacco Use   Smoking status: Every Day    Packs/day: 1.00    Years: 31.00    Additional pack years: 0.00    Total pack years: 31.00    Types: Cigars, Cigarettes   Smokeless tobacco: Never  Vaping Use   Vaping Use: Every day  Substance and Sexual Activity   Alcohol use: Yes    Alcohol/week: 1.0 standard drink of alcohol    Types: 1 Shots of liquor per week    Comment: rare   Drug use: Yes    Types: Marijuana   Sexual activity: Yes  Other Topics Concern   Not on file  Social History Narrative   Not on file   Social Determinants of Health   Financial Resource Strain: Not on file  Food Insecurity: Not on file  Transportation Needs: Not on file  Physical Activity: Not on file  Stress: Not on file  Social Connections: Not on file  Intimate Partner Violence: Not on file  Constitutional: Denies fever, malaise, fatigue, headache or abrupt weight changes.  HEENT: Denies eye pain, eye redness, ear pain, ringing in the ears, wax buildup, runny nose, nasal congestion, bloody nose, or sore throat. Respiratory: Patient reports shortness of breath with exertion.  Denies difficulty breathing, cough or sputum production.   Cardiovascular: Denies chest pain, chest tightness, palpitations or swelling in the hands or feet.  Gastrointestinal: Denies abdominal pain, bloating, constipation, diarrhea or blood in the stool.  GU: Patient reports erectile dysfunction.  Denies urgency, frequency, pain with urination, burning sensation, blood in urine, odor or discharge. Musculoskeletal: Patient reports multiple joint pain.  Denies decrease in range of motion, difficulty with gait, muscle pain or joint swelling.  Skin: Denies redness, rashes,  lesions or ulcercations.  Neurological: Denies dizziness, difficulty with memory, difficulty with speech or problems with balance and coordination.  Psych: Denies anxiety, depression, SI/HI.  No other specific complaints in a complete review of systems (except as listed in HPI above).  Objective:   Physical Exam  BP 120/82   Pulse 63   Ht 6\' 6"  (1.981 m)   Wt 156 lb (70.8 kg)   SpO2 99%   BMI 18.03 kg/m   Wt Readings from Last 3 Encounters:  08/19/22 151 lb 3.2 oz (68.6 kg)  07/18/22 145 lb (65.8 kg)  03/30/22 149 lb 14.6 oz (68 kg)    General: Appears his stated age, underweight, in NAD. Skin: Warm, dry and intact.  HEENT: Head: normal shape and size; Eyes: sclera white, no icterus, conjunctiva pink, PERRLA and EOMs intact;  Cardiovascular: Normal rate and rhythm. S1,S2 noted.  Murmur noted. No JVD or BLE edema. No carotid bruits noted. Pulmonary/Chest: Normal effort and diminished breath sounds. No respiratory distress. No wheezes, rales or ronchi noted.  Musculoskeletal: N No signs of joint swelling. No difficulty with gait.  Neurological: Alert and oriented. Coordination normal.  Psychiatric: Mood and affect normal. Behavior is normal. Judgment and thought content normal.     BMET    Component Value Date/Time   NA 144 07/18/2022 0939   NA 142 06/23/2017 0917   K 4.4 07/18/2022 0939   CL 109 07/18/2022 0939   CO2 28 07/18/2022 0939   GLUCOSE 91 07/18/2022 0939   BUN 20 07/18/2022 0939   BUN 20 06/23/2017 0917   CREATININE 1.09 07/18/2022 0939   CALCIUM 9.4 07/18/2022 0939   GFRNONAA 68 06/23/2017 0917   GFRAA 78 06/23/2017 0917    Lipid Panel     Component Value Date/Time   CHOL 136 07/18/2022 0939   CHOL 166 08/24/2019 0942   TRIG 44 07/18/2022 0939   HDL 49 07/18/2022 0939   HDL 50 08/24/2019 0942   CHOLHDL 2.8 07/18/2022 0939   LDLCALC 74 07/18/2022 0939    CBC    Component Value Date/Time   WBC 8.8 07/18/2022 0939   RBC 4.33 07/18/2022 0939    HGB 14.2 07/18/2022 0939   HGB 14.4 06/23/2017 0917   HCT 42.1 07/18/2022 0939   HCT 41.6 06/23/2017 0917   PLT 194 07/18/2022 0939   PLT 220 06/23/2017 0917   MCV 97.2 07/18/2022 0939   MCV 94 06/23/2017 0917   MCH 32.8 07/18/2022 0939   MCHC 33.7 07/18/2022 0939   RDW 12.2 07/18/2022 0939   RDW 12.9 06/23/2017 0917   LYMPHSABS 2.3 01/01/2014 2000   MONOABS 0.6 01/01/2014 2000   EOSABS 0.4 01/01/2014 2000   BASOSABS 0.1 01/01/2014 2000    Hgb A1C Lab  Results  Component Value Date   HGBA1C 5.6 07/18/2022            Assessment & Plan:  Counseling for Smoking Cessation:  Rx for Chantix starter pack given  RTC in 6 months for your annual exam Nicki Reaper, NP

## 2023-01-16 NOTE — Assessment & Plan Note (Signed)
Continue Cialis per urology

## 2023-01-16 NOTE — Assessment & Plan Note (Signed)
C-Met and lipid profile today Encouraged him to consume low-fat diet Continue atorvastatin 

## 2023-01-16 NOTE — Assessment & Plan Note (Signed)
He does not want to start inhalers at this time Rx for Chantix starting pack for smoking cessation

## 2023-01-17 LAB — CBC
HCT: 42.6 % (ref 38.5–50.0)
Hemoglobin: 14.2 g/dL (ref 13.2–17.1)
MCH: 32.5 pg (ref 27.0–33.0)
MCHC: 33.3 g/dL (ref 32.0–36.0)
MCV: 97.5 fL (ref 80.0–100.0)
MPV: 12.1 fL (ref 7.5–12.5)
Platelets: 193 10*3/uL (ref 140–400)
RBC: 4.37 10*6/uL (ref 4.20–5.80)
RDW: 12 % (ref 11.0–15.0)
WBC: 10.2 10*3/uL (ref 3.8–10.8)

## 2023-01-17 LAB — COMPLETE METABOLIC PANEL WITH GFR
AG Ratio: 1.7 (calc) (ref 1.0–2.5)
ALT: 17 U/L (ref 9–46)
AST: 20 U/L (ref 10–35)
Albumin: 4.5 g/dL (ref 3.6–5.1)
Alkaline phosphatase (APISO): 58 U/L (ref 35–144)
BUN: 14 mg/dL (ref 7–25)
CO2: 28 mmol/L (ref 20–32)
Calcium: 9.6 mg/dL (ref 8.6–10.3)
Chloride: 107 mmol/L (ref 98–110)
Creat: 1.16 mg/dL (ref 0.70–1.30)
Globulin: 2.6 g/dL (calc) (ref 1.9–3.7)
Glucose, Bld: 75 mg/dL (ref 65–99)
Potassium: 4.6 mmol/L (ref 3.5–5.3)
Sodium: 143 mmol/L (ref 135–146)
Total Bilirubin: 0.6 mg/dL (ref 0.2–1.2)
Total Protein: 7.1 g/dL (ref 6.1–8.1)
eGFR: 76 mL/min/{1.73_m2} (ref >=60)

## 2023-01-17 LAB — LIPID PANEL
Cholesterol: 150 mg/dL (ref <200)
HDL: 49 mg/dL (ref >=40)
LDL Cholesterol (Calc): 86 mg/dL (calc)
Non-HDL Cholesterol (Calc): 101 mg/dL (calc) (ref <130)
Total CHOL/HDL Ratio: 3.1 (calc) (ref <5.0)
Triglycerides: 68 mg/dL (ref <150)

## 2023-01-17 LAB — HEMOGLOBIN A1C
Hgb A1c MFr Bld: 5.7 % of total Hgb — ABNORMAL HIGH (ref <5.7)
Mean Plasma Glucose: 117 mg/dL
eAG (mmol/L): 6.5 mmol/L

## 2023-03-07 ENCOUNTER — Other Ambulatory Visit: Payer: Self-pay | Admitting: Internal Medicine

## 2023-03-07 MED ORDER — TADALAFIL 20 MG PO TABS: 20.0000 mg | ORAL_TABLET | Freq: Every day | ORAL | 1 refills | Status: AC

## 2023-03-07 NOTE — Telephone Encounter (Signed)
Medication Refill - Medication: tadalafil (CIALIS) 20 MG tablet [[id number]]   Has the patient contacted their pharmacy? Yes.   (Agent: If no, request that the patient contact the pharmacy for the refill. If patient does not wish to contact the pharmacy document the reason why and proceed with request.) (Agent: If yes, when and what did the pharmacy advise?)  Preferred Pharmacy (with phone number or street name):  [id number] - [place], [place] - [address] Phone: [phone]  Fax: [phone]     Has the patient been seen for an appointment in the last year OR does the patient have an upcoming appointment? Yes.    Agent: Please be advised that RX refills may take up to 3 business days. We ask that you follow-up with your pharmacy.

## 2023-05-09 ENCOUNTER — Encounter (HOSPITAL_COMMUNITY): Payer: Self-pay

## 2023-05-09 ENCOUNTER — Emergency Department (HOSPITAL_COMMUNITY)
Admission: EM | Admit: 2023-05-09 | Discharge: 2023-05-09 | Disposition: A | Discharge status: Home / Self Care | Payer: Federal, State, Local not specified - PPO | Attending: Emergency Medicine | Admitting: Emergency Medicine

## 2023-05-09 ENCOUNTER — Emergency Department (HOSPITAL_COMMUNITY): Payer: Federal, State, Local not specified - PPO

## 2023-05-09 ENCOUNTER — Ambulatory Visit: Payer: Self-pay | Admitting: *Deleted

## 2023-05-09 ENCOUNTER — Other Ambulatory Visit: Payer: Self-pay

## 2023-05-09 DIAGNOSIS — R918 Other nonspecific abnormal finding of lung field: Secondary | ICD-10-CM | POA: Diagnosis not present

## 2023-05-09 DIAGNOSIS — R0789 Other chest pain: Secondary | ICD-10-CM | POA: Diagnosis not present

## 2023-05-09 DIAGNOSIS — R079 Chest pain, unspecified: Secondary | ICD-10-CM | POA: Diagnosis not present

## 2023-05-09 DIAGNOSIS — F17210 Nicotine dependence, cigarettes, uncomplicated: Secondary | ICD-10-CM | POA: Insufficient documentation

## 2023-05-09 DIAGNOSIS — J929 Pleural plaque without asbestos: Secondary | ICD-10-CM | POA: Diagnosis not present

## 2023-05-09 DIAGNOSIS — R0781 Pleurodynia: Secondary | ICD-10-CM

## 2023-05-09 DIAGNOSIS — R0600 Dyspnea, unspecified: Secondary | ICD-10-CM | POA: Diagnosis not present

## 2023-05-09 DIAGNOSIS — J9811 Atelectasis: Secondary | ICD-10-CM | POA: Diagnosis not present

## 2023-05-09 DIAGNOSIS — D72829 Elevated white blood cell count, unspecified: Secondary | ICD-10-CM | POA: Insufficient documentation

## 2023-05-09 DIAGNOSIS — R0601 Orthopnea: Secondary | ICD-10-CM | POA: Insufficient documentation

## 2023-05-09 DIAGNOSIS — R0602 Shortness of breath: Secondary | ICD-10-CM | POA: Diagnosis not present

## 2023-05-09 DIAGNOSIS — J90 Pleural effusion, not elsewhere classified: Secondary | ICD-10-CM | POA: Diagnosis not present

## 2023-05-09 DIAGNOSIS — R112 Nausea with vomiting, unspecified: Secondary | ICD-10-CM | POA: Insufficient documentation

## 2023-05-09 LAB — TROPONIN I (HIGH SENSITIVITY)
Troponin I (High Sensitivity): 46 ng/L — ABNORMAL HIGH (ref <18)
Troponin I (High Sensitivity): 39 ng/L — ABNORMAL HIGH (ref <18)

## 2023-05-09 LAB — HEPATIC FUNCTION PANEL
ALT: 26 U/L (ref 0–44)
AST: 22 U/L (ref 15–41)
Albumin: 3.7 g/dL (ref 3.5–5.0)
Alkaline Phosphatase: 56 U/L (ref 38–126)
Bilirubin, Direct: 0.2 mg/dL (ref 0.0–0.2)
Indirect Bilirubin: 1 mg/dL — ABNORMAL HIGH (ref 0.3–0.9)
Total Bilirubin: 1.2 mg/dL (ref 0.3–1.2)
Total Protein: 6.4 g/dL — ABNORMAL LOW (ref 6.5–8.1)

## 2023-05-09 LAB — BASIC METABOLIC PANEL
Anion gap: 5 (ref 5–15)
BUN: 18 mg/dL (ref 6–20)
CO2: 24 mmol/L (ref 22–32)
Calcium: 9 mg/dL (ref 8.9–10.3)
Chloride: 112 mmol/L — ABNORMAL HIGH (ref 98–111)
Creatinine, Ser: 1.21 mg/dL (ref 0.61–1.24)
GFR, Estimated: >60 mL/min (ref >60)
Glucose, Bld: 89 mg/dL (ref 70–99)
Potassium: 4.4 mmol/L (ref 3.5–5.1)
Sodium: 141 mmol/L (ref 135–145)

## 2023-05-09 LAB — CBC
HCT: 39.7 % (ref 39.0–52.0)
Hemoglobin: 12.9 g/dL — ABNORMAL LOW (ref 13.0–17.0)
MCH: 32.8 pg (ref 26.0–34.0)
MCHC: 32.5 g/dL (ref 30.0–36.0)
MCV: 101 fL — ABNORMAL HIGH (ref 80.0–100.0)
Platelets: 127 10*3/uL — ABNORMAL LOW (ref 150–400)
RBC: 3.93 MIL/uL — ABNORMAL LOW (ref 4.22–5.81)
RDW: 13.3 % (ref 11.5–15.5)
WBC: 11.5 10*3/uL — ABNORMAL HIGH (ref 4.0–10.5)
nRBC: 0 % (ref 0.0–0.2)

## 2023-05-09 LAB — BRAIN NATRIURETIC PEPTIDE: B Natriuretic Peptide: 978.2 pg/mL — ABNORMAL HIGH (ref 0.0–100.0)

## 2023-05-09 MED ORDER — IOHEXOL 350 MG/ML SOLN
100.0000 mL | Freq: Once | INTRAVENOUS | Status: AC | PRN
  Administered 2023-05-09: 100 mL via INTRAVENOUS

## 2023-05-09 MED ORDER — LOSARTAN POTASSIUM-HCTZ 50-12.5 MG PO TABS: 1.0000 | ORAL_TABLET | Freq: Every day | ORAL | 0 refills | Status: DC

## 2023-05-09 NOTE — ED Provider Notes (Signed)
[place] EMERGENCY DEPARTMENT AT [facility] Provider Note   CSN: [id number] Arrival date & time: 05/09/23  1345     History {Add pertinent medical, surgical, social history, OB history to HPI:1} Chief Complaint  Patient presents with   Shortness of Breath   Chest Pain    Christopher Ball is a 52 y.o. male with PMH as listed below who presents with ***.  General unwell feeling, chest heaviness and exertional dyspnea that started Wednesday. Pt has been feeling unwell for over a week. Pt c/o worsening SOB when laying down. States pain is from bottom of torso all the way up into chest.   Constant chest pain on the left side, feels it in both of his arms, now rated 4/10, also goes into his back and shoulders. Worst in the mornings. Yesterday and this morning he woke up and he felt like he couldn't breathe. Has no energy, very fatigued. All symptoms started one week ago. Smokes cigarettes and has had pain with inhalation as well. Also endorses nausea/vomiting, decreased PO intake, and cough productive of clear mucous, feels sometimes like he is choking. +Orthopnea, he starts coughing when he lies flat. Denies leg swelling, abdominal bloating. Smokes 1 ppd for 30+ years. No recent hospitalizations/surgeries. 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 Also has h/o PTX on left side in his 45s.   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 Authorizing Provider  losartan-hydrochlorothiazide (HYZAAR) 50-12.5 MG tablet Take 1 tablet by mouth daily. 05/09/23 06/08/23 Yes Loetta Rough, MD  atorvastatin (LIPITOR) 20 MG tablet TAKE 1 TABLET  (20 MG TOTAL) BY MOUTH DAILY. KEEP OFFICE VISIT FOR FUTURE REFILLS. 12/12/22   [name], MD  clindamycin (CLEOCIN) 300 MG capsule Take 2 tablets 1 hour prior to dental appointment 06/18/22   [name], MD  tadalafil (CIALIS) 20 MG tablet Take 1 tablet (20 mg total) by mouth daily. 03/07/23   Lorre Munroe, NP  Varenicline Tartrate, Starter, (CHANTIX STARTING MONTH PAK) 0.5 MG X 11 & 1 MG X 42 TBPK As directed 01/16/23   Lorre Munroe, NP      Allergies    Augmentin [amoxicillin-pot clavulanate]    Review of Systems   Review of Systems A 10 point review of systems was performed and is negative unless otherwise reported in HPI.  Physical Exam Updated Vital Signs BP (!) 109/37   Pulse 71   Temp 97.8 F (36.6 C)   Resp (!) 26   Ht 6\' 6"  (1.981 m)   Wt 68 kg   SpO2 96%   BMI 17.33 kg/m  Physical Exam General: Normal appearing {Desc; male/male:11659}, lying in bed.  HEENT: PERRLA, Sclera anicteric, MMM, trachea midline.  Cardiology: RRR, no murmurs/rubs/gallops. BL radial and DP pulses equal bilaterally.  Resp: Normal respiratory rate and effort. CTAB, no wheezes, rhonchi, crackles.  Abd: Soft, non-tender, non-distended. No rebound tenderness or guarding.  GU: Deferred. MSK: No peripheral edema or signs of trauma. Extremities without deformity or TTP. No cyanosis or clubbing. Skin: warm, dry. No rashes or lesions.  Back: No CVA tenderness Neuro: A&Ox4, CNs II-XII grossly intact. MAEs. Sensation grossly intact.  Psych: Normal mood and affect.   ED Results / Procedures / Treatments   Labs (all labs ordered are listed, but only abnormal results are displayed) Labs Reviewed  BASIC METABOLIC PANEL - Abnormal; Notable for the following components:      Result Value   Chloride 112 (*)    All other components within normal limits  CBC - Abnormal; Notable for the following components:   WBC 11.5 (*)    RBC 3.93 (*)    Hemoglobin 12.9 (*)    MCV 101.0 (*)     Platelets 127 (*)    All other components within normal limits  BRAIN NATRIURETIC PEPTIDE - Abnormal; Notable for the following components:   B Natriuretic Peptide 978.2 (*)    All other components within normal limits  HEPATIC FUNCTION PANEL - Abnormal; Notable for the following components:   Total Protein 6.4 (*)    Indirect Bilirubin 1.0 (*)    All other components within normal limits  TROPONIN I (HIGH SENSITIVITY) - Abnormal; Notable for the following components:   Troponin I (High Sensitivity) 46 (*)    All other components within normal limits  TROPONIN I (HIGH SENSITIVITY) - Abnormal; Notable for the following components:   Troponin I (High Sensitivity) 39 (*)    All other components within normal limits    EKG EKG Interpretation Date/Time:  Friday May 09 2023 14:00:50 EDT Ventricular Rate:  70 PR Interval:  138 QRS Duration:  94 QT Interval:  400 QTC Calculation: 432 R Axis:   82  Text Interpretation: Normal sinus rhythm Possible Left atrial enlargement Left ventricular hypertrophy ( Sokolow-Lyon , Romhilt-Estes ) Nonspecific ST abnormality Confirmed by Vivi Barrack [phone]) on 05/09/2023 3:48:27 PM  Radiology CT Angio Chest/Abd/Pel for Dissection W and/or Wo Contrast  Result Date: 05/09/2023 CLINICAL DATA:  Chest pain and dyspnea for 2 days, initial encounter EXAM: CT ANGIOGRAPHY CHEST, ABDOMEN AND PELVIS TECHNIQUE: Non-contrast CT of the chest was initially obtained. Multidetector CT imaging through the chest, abdomen and pelvis was performed using the standard protocol during bolus administration of intravenous contrast. Multiplanar reconstructed images and MIPs were obtained and reviewed to evaluate the vascular anatomy. RADIATION DOSE REDUCTION: This exam was performed according to the departmental dose-optimization program which includes automated exposure control, adjustment of the mA and/or kV according to patient size and/or use of iterative reconstruction  technique. CONTRAST:  OMNIPAQUE IOHEXOL 350 MG/ML SOLN COMPARISON:  Chest x-ray from earlier in the same day. FINDINGS: CTA CHEST FINDINGS Cardiovascular: Initial precontrast images show no hyperdense crescent to suggest acute aortic injury. Postcontrast images show atherosclerotic calcification of the thoracic aorta particularly in the region of the aortic root and proximal ascending colon. No findings to suggest aneurysmal dilatation or dissection are noted. Coronary calcifications are seen of a mild degree. The pulmonary artery shows a normal branching pattern bilaterally. No filling defect to suggest pulmonary embolism is noted. Mediastinum/Nodes: Thoracic inlet is within normal limits. No sizable hilar or mediastinal adenopathy is noted. The esophagus as visualized is within normal limits. Lungs/Pleura: Postsurgical changes are noted in the left apex. Emphysematous changes are identified. Small effusions are seen bilaterally slightly greater on the left than the right. Bilateral lower lobe atelectatic changes are seen. No parenchymal nodules are seen. Musculoskeletal: No chest wall abnormality. No acute or significant osseous findings. Review of the MIP images confirms the above findings. CTA ABDOMEN AND PELVIS  FINDINGS VASCULAR Aorta: Atherosclerotic calcifications are noted without aneurysmal dilatation or dissection. Celiac: Patent without evidence of aneurysm, dissection, vasculitis or significant stenosis. SMA: Patent without evidence of aneurysm, dissection, vasculitis or significant stenosis. Renals: Both renal arteries are patent without evidence of aneurysm, dissection, vasculitis, fibromuscular dysplasia or significant stenosis. Dual renal arteries are noted on the right. IMA: Patent without evidence of aneurysm, dissection, vasculitis or significant stenosis. Inflow: Iliacs show atherosclerotic calcifications without focal stenosis. Focal aneurysmal dilatation of the distal aspect of the left  common iliac is noted to 16 mm. Veins: No specific venous abnormality is noted. Review of the MIP images confirms the above findings. NON-VASCULAR Hepatobiliary: No focal liver abnormality is seen. No gallstones, gallbladder wall thickening, or biliary dilatation. Pancreas: Unremarkable. No pancreatic ductal dilatation or surrounding inflammatory changes. Spleen: Normal in size without focal abnormality. Adrenals/Urinary Tract: Adrenal glands are within normal limits. Kidneys show a normal enhancement pattern bilaterally. No renal calculi or obstructive changes are seen. The bladder is partially distended. Stomach/Bowel: The appendix is not well visualized and may have been surgically removed. No inflammatory changes are seen. No obstructive changes of the colon are noted. The small bowel and stomach are within normal limits. Lymphatic: No lymphadenopathy is identified. Reproductive: Prostate is unremarkable. Other: No abdominal wall hernia or abnormality. No abdominopelvic ascites. Musculoskeletal: Degenerative changes of the lumbar spine are noted. 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 Review of the MIP images confirms the above findings. IMPRESSION: CTA of the chest: No aneurysmal dilatation or dissection. Emphysematous changes and postsurgical changes in the left apex. Small effusions bilaterally with basilar atelectasis. CTA of the abdomen and pelvis: No acute arterial abnormality is noted. Mild focal dilatation of the distal left common iliac artery is noted. No acute abnormality in the abdomen and pelvis is seen. Aortic Atherosclerosis (ICD10-I70.0) and Emphysema (ICD10-J43.9). Electronically Signed   By: Alcide Clever M.D.   On: 05/09/2023 19:40   DG Chest 2 View  Result Date: 05/09/2023 CLINICAL DATA:  chest pain, sob EXAM: CHEST - 2 VIEW COMPARISON:  06/18/2017. FINDINGS: There is blunting of bilateral costophrenic angles, which may be due to pleural thickening versus  trace pleural effusions. There is biapical pleural thickening, left-greater-than-right, similar to the prior study. Surgical suture line noted overlying the left lung apex. Bilateral lungs are otherwise clear. No acute consolidation or major lung collapse. Normal cardio-mediastinal silhouette. Sternotomy wires noted. No acute osseous abnormalities. The soft tissues are within normal limits. IMPRESSION: 1. Blunting of bilateral costophrenic angles, which may be due to pleural thickening versus trace pleural effusions. Electronically Signed   By: Jules Schick M.D.   On: 05/09/2023 15:39    Procedures Procedures  {Document cardiac monitor, telemetry assessment procedure when appropriate:1}  Medications Ordered in ED Medications  iohexol (OMNIPAQUE) 350 MG/ML injection 100 mL (100 mLs Intravenous Contrast Given 05/09/23 1851)    ED Course/ Medical Decision Making/ A&P                          Medical Decision Making Amount and/or Complexity of Data Reviewed Labs: ordered. Decision-making details documented in ED Course. Radiology: ordered. Decision-making details documented in ED Course.  Risk Prescription drug management.    This patient presents to the ED for concern of CP, SOB, orthopnea, fatigue; this involves an extensive number of treatment options, and is a complaint that carries with it a high risk of complications and morbidity.  I  considered the following differential and admission for this acute, potentially life threatening condition.   MDM:    Consider ACS/arrhythmia, EKG without any acute ischemic changes and pain seems more pleuritic. Consider PTX given history or pleural effusions, patient complaining of orthopnea/coughing clear fluid/fatigue, consider new heart failure or pulmonary edema, no crackles heard on exam. He has no peripheral edema to suggest right-sided heart failure. Consider valvular heart disease, had aortic valve replacement in 2007 and has wide pulse pressure  today. Also consider aortic aneurysm or dissection esp given history of marfan syndrome and endovascular aortic repair. Considered also PNA, viral bronchitis, pericardial effusion. He has   Clinical Course as of 05/09/23 2048  Fri May 09, 2023  1647 WBC(!): 11.5 Mild leukocytosis [HN]  1647 Troponin I (High Sensitivity)(!): 46 elevated [HN]  1647 Basic metabolic panel(!) Unremarkable in the context of this patient's presentation  [HN]  1648 DG Chest 2 View 1. Blunting of bilateral costophrenic angles, which may be due to pleural thickening versus trace pleural effusions.   [HN]  1808 B Natriuretic Peptide(!): 978.2 +elevated BNP [HN]  1808 Troponin I (High Sensitivity)(!): 39 Mildly elevated, relatively flat troponin. [HN]  1841 Hepatic function panel(!) Unremarkable in the context of this patient's presentation  [HN]  2022 CT Angio Chest/Abd/Pel for Dissection W and/or Wo Contrast CTA of the chest: No aneurysmal dilatation or dissection.  Emphysematous changes and postsurgical changes in the left apex.  Small effusions bilaterally with basilar atelectasis.  CTA of the abdomen and pelvis: No acute arterial abnormality is noted.  Mild focal dilatation of the distal left common iliac artery is noted.  No acute abnormality in the abdomen and pelvis is seen.  Aortic Atherosclerosis (ICD10-I70.0) and Emphysema (ICD10-J43.9).   [HN]  2034 D/w Dr. Jacinto Halim with cardiology for new heart failure symptoms. Patient does not want to be admitted to the hospital. Patient is already established with cardiology. Recommends starting him on losartan-hydrochlorothiazide 50-12.5 mg (Dr. Jacinto Halim aware of the patient's blood pressures while he has been here) and he can go home and call on Monday to make an appointment, take it easy over the weekend.  [HN]  2044 Patient is requesting to leave and is wanting to walk out of ED. He is offered admission to the hospital but declines. He given DC  instructions/return precautions and all questions are answered to his satisfaction.  [HN]    Clinical Course User Index [HN] Loetta Rough, MD    Labs: I Ordered, and personally interpreted labs.  The pertinent results include:  those listed above  Imaging Studies ordered: I ordered imaging studies including CXR, CT dissection study I independently visualized and interpreted imaging. I agree with the radiologist interpretation  Additional history obtained from wife at bedside, chart review.    Cardiac Monitoring: The patient was maintained on a cardiac monitor.  I personally viewed and interpreted the cardiac monitored which showed an underlying rhythm of: NSR  Reevaluation: After the interventions noted above, I reevaluated the patient and found that they have :stayed the same  Social Determinants of Health: Lives independently  Disposition:  DC  Co morbidities that complicate the patient evaluation  Past Medical History:  Diagnosis Date   Marfan syndrome    Stomach problems    Thoracic aortic aneurysm (HCC)    replacement by Dr. [name] /30/07 with aortic bioprosthetic valve   Tobacco abuse      Medicines Meds ordered this encounter  Medications   iohexol (OMNIPAQUE) 350 MG/ML  injection 100 mL   losartan-hydrochlorothiazide (HYZAAR) 50-12.5 MG tablet    Sig: Take 1 tablet by mouth daily.    Dispense:  30 tablet    Refill:  0    I have reviewed the patients home medicines and have made adjustments as needed  Problem List / ED Course: Problem List Items Addressed This Visit   None Visit Diagnoses     Orthopnea    -  Primary   Pleural effusion, bilateral       Pleuritic chest pain                {Document critical care time when appropriate:1} {Document review of labs and clinical decision tools ie heart score, Chads2Vasc2 etc:1}  {Document your independent review of radiology images, and any outside records:1} {Document your discussion with  family members, caretakers, and with consultants:1} {Document social determinants of health affecting pt's care:1} {Document your decision making why or why not admission, treatments were needed:1}  This note was created using dictation software, which may contain spelling or grammatical errors.

## 2023-05-09 NOTE — Discharge Instructions (Signed)
Thank you for coming to [facility] Emergency Department. You were seen for chest pain, shortness of breath. We did an exam, labs, and imaging, and these showed small mount of fluid on your lungs.  Your heart may not be pumping as well as it used to, this could indicate new heart failure.  You will need to be evaluated by cardiology in clinic.  Please call your cardiologist on Monday morning to make an appointment for within the next 1 to 2 weeks.  We called a cardiologist here in the ED who recommended starting losartan-HCTZ tablets once per day.  You can start these tomorrow morning. You were offered admission to the hospital but preferred to be discharged home.  Do not hesitate to return to the ED or call 911 if you experience: -Worsening symptoms -Lightheadedness, passing out -Fevers/chills -Anything else that concerns you

## 2023-05-09 NOTE — Telephone Encounter (Signed)
Will review ED note once available. 

## 2023-05-09 NOTE — Progress Notes (Signed)
Patient presented with symptoms suggestive of acute diastolic heart failure and no dissection on CT, BNP elevated with small bilateral pleural effusions.  In view of Marphans, recommend starting Losartan and because of diastolic CHF, add hydrochlorothiazide 12.5 mg and to call us on Monday to schedule follow up

## 2023-05-09 NOTE — ED Triage Notes (Signed)
General unwell feeling, chest heaviness and exertional dyspnea that started Wednesday. Pt has been feeling unwell for over a week. Pt c/o worsening SOB when laying down. States pain is from bottom of torso all the way up into chest.

## 2023-05-09 NOTE — Telephone Encounter (Signed)
Reason for Disposition  [1] MODERATE difficulty breathing (e.g., speaks in phrases, SOB even at rest, pulse 100-120) AND [2] NEW-onset or WORSE than normal  Answer Assessment - Initial Assessment Questions 1. RESPIRATORY STATUS: "Describe your breathing?" (e.g., wheezing, shortness of breath, unable to speak, severe coughing)      SOB 2. ONSET: "When did this breathing problem begin?"      Started Monday- getting worse 3. PATTERN "Does the difficult breathing come and go, or has it been constant since it started?"      Comes and goes 4. SEVERITY: "How bad is your breathing?" (e.g., mild, moderate, severe)    - MILD: No SOB at rest, mild SOB with walking, speaks normally in sentences, can lie down, no retractions, pulse < 100.    - MODERATE: SOB at rest, SOB with minimal exertion and prefers to sit, cannot lie down flat, speaks in phrases, mild retractions, audible wheezing, pulse 100-120.    - SEVERE: Very SOB at rest, speaks in single words, struggling to breathe, sitting hunched forward, retractions, pulse > 120      severe 5. RECURRENT SYMPTOM: "Have you had difficulty breathing before?" If Yes, ask: "When was the last time?" and "What happened that time?"      Emphysema hx possible- denies any breathing issues in the past  8. CAUSE: "What do you think is causing the breathing problem?"      unknown 9. OTHER SYMPTOMS: "Do you have any other symptoms? (e.g., dizziness, runny nose, cough, chest pain, fever)     Pain in shoulders and arms, SOB- pain with breathing  Protocols used: Breathing Difficulty-A-AH

## 2023-05-09 NOTE — Telephone Encounter (Signed)
Patient's wife on the phone Chief Complaint: trouble breathing Symptoms: SOB, pain with breathing, shoulder pain Frequency: symptoms started Monday  Disposition: [x] ED /[] Urgent Care (no appt availability in office) / [] Appointment(In office/virtual)/ []  [place] [facility]/ [] Home Care/ [] Refused Recommended Disposition /[] [facility]/ []  Follow-up with PCP Additional Notes: Advised ED as soon as possible- call 911 if needed. Patient states he does not want to go- advised ED now. Hopefully wife will convince him to go.

## 2023-05-14 ENCOUNTER — Ambulatory Visit: Payer: Federal, State, Local not specified - PPO | Attending: Nurse Practitioner | Admitting: Nurse Practitioner

## 2023-05-14 ENCOUNTER — Encounter: Payer: Self-pay | Admitting: Nurse Practitioner

## 2023-05-14 VITALS — BP 118/58 | HR 67 | Ht 78.0 in | Wt 150.6 lb

## 2023-05-14 DIAGNOSIS — Z952 Presence of prosthetic heart valve: Secondary | ICD-10-CM | POA: Diagnosis not present

## 2023-05-14 DIAGNOSIS — R0602 Shortness of breath: Secondary | ICD-10-CM

## 2023-05-14 DIAGNOSIS — E782 Mixed hyperlipidemia: Secondary | ICD-10-CM

## 2023-05-14 DIAGNOSIS — R072 Precordial pain: Secondary | ICD-10-CM

## 2023-05-14 DIAGNOSIS — Q8740 Marfan's syndrome, unspecified: Secondary | ICD-10-CM

## 2023-05-14 MED ORDER — METOPROLOL TARTRATE 100 MG PO TABS: ORAL_TABLET | ORAL | 0 refills | Status: DC

## 2023-05-14 MED ORDER — METOPROLOL TARTRATE 100 MG PO TABS
ORAL_TABLET | ORAL | 0 refills | Status: DC
Start: 2023-05-14 — End: 2023-06-23

## 2023-05-14 NOTE — Patient Instructions (Addendum)
Medication Instructions:  Metoprolol 100 mg take 2 hour prior to CT. *If you need a refill on your cardiac medications before your next appointment, please call your pharmacy*   Lab Work: BNP & BMP 1 week prior to the CT.    Testing/Procedures: Your physician has requested that you have an echocardiogram. Echocardiography is a painless test that uses sound waves to create images of your heart. It provides your doctor with information about the size and shape of your heart and how well your heart's chambers and valves are working. This procedure takes approximately one hour. There are no restrictions for this procedure. Please do NOT wear cologne, perfume, aftershave, or lotions (deodorant is allowed). Please arrive 15 minutes prior to your appointment time.    Your cardiac CT will be scheduled at one of the below locations:   [facility] [address] [place], [place] [address] [phone]  OR  [facility] [address] [place], [place] [address] [phone]  OR   [facility] [address] [place], [place] [address] [phone]  If scheduled at [facility], please arrive at the [facility] and Children's Entrance (Entrance C2) of [facility] 30 minutes prior to test start time. You can use the FREE valet parking offered at entrance C (encouraged to control the heart rate for the test)  Proceed to the [facility] Radiology Department (first floor) to check-in and test prep.  All radiology patients and guests should use entrance C2 at [facility], accessed from [facility], even though the hospital's physical address listed is [address]    If scheduled at [facility] or [facility], please arrive 15 mins early for check-in and test prep.  There is spacious parking and easy access to the  radiology department from the [facility] entrance. Please enter here and check-in with the desk attendant.   Please follow these instructions carefully (unless otherwise directed):  An IV will be required for this test and Nitroglycerin will be given.  Hold all erectile dysfunction medications at least 3 days (72 hrs) prior to test. (Ie viagra, cialis, sildenafil, tadalafil, etc)   On the Night Before the Test: Be sure to Drink plenty of water. Do not consume any caffeinated/decaffeinated beverages or chocolate 12 hours prior to your test. Do not take any antihistamines 12 hours prior to your test. If the patient has contrast allergy: Patient will need a prescription for Prednisone and very clear instructions (as follows): Prednisone 50 mg - take 13 hours prior to test Take another Prednisone 50 mg 7 hours prior to test Take another Prednisone 50 mg 1 hour prior to test Take Benadryl 50 mg 1 hour prior to test Patient must complete all four doses of above prophylactic medications. Patient will need a ride after test due to Benadryl.  On the Day of the Test: Drink plenty of water until 1 hour prior to the test. Do not eat any food 1 hour prior to test. You may take your regular medications prior to the test.  Take metoprolol (Lopressor) two hours prior to test. If you take Furosemide/Hydrochlorothiazide/Spironolactone, please HOLD on the morning of the test. FEMALES- please wear underwire-free bra if available, avoid dresses & tight clothing  *For Clinical Staff only. Please instruct patient the following:* Heart Rate Medication Recommendations for Cardiac CT  Resting HR < 50 bpm  No medication  Resting HR 50-60 bpm and BP >110/50 mmHG   Consider Metoprolol tartrate 25 mg PO 90-120 min prior to scan  Resting HR 60-65 bpm and BP >110/50 mmHG  Metoprolol tartrate 50 mg PO 90-120 minutes prior to scan   Resting HR > 65 bpm and BP >110/50 mmHG  Metoprolol tartrate 100 mg  PO 90-120 minutes prior to scan  Consider Ivabradine 10-15 mg PO or a calcium channel blocker for resting HR >60 bpm and contraindication to metoprolol tartrate  Consider Ivabradine 10-15 mg PO in combination with metoprolol tartrate for HR >80 bpm        After the Test: Drink plenty of water. After receiving IV contrast, you may experience a mild flushed feeling. This is normal. On occasion, you may experience a mild rash up to 24 hours after the test. This is not dangerous. If this occurs, you can take Benadryl 25 mg and increase your fluid intake. If you experience trouble breathing, this can be serious. If it is severe call 911 IMMEDIATELY. If it is mild, please call our office. If you take any of these medications: Glipizide/Metformin, Avandament, Glucavance, please do not take 48 hours after completing test unless otherwise instructed.  We will call to schedule your test 2-4 weeks out understanding that some insurance companies will need an authorization prior to the service being performed.   For more information and frequently asked questions, please visit our website : [email or web address]  For non-scheduling related questions, please contact the cardiac imaging nurse navigator should you have any questions/concerns: Cardiac Imaging Nurse Navigators Direct Office Dial: [phone]   For scheduling needs, including cancellations and rescheduling, please call [place], [phone].     Follow-Up: At [facility], you and your health needs are our priority.  As part of our continuing mission to provide you with exceptional heart care, we have created designated Provider Care Teams.  These Care Teams include your primary Cardiologist (physician) and Advanced Practice Providers (APPs -  Physician Assistants and Nurse Practitioners) who all work together to provide you with the care you need, when you need it.  We recommend signing up for the patient portal called  "MyChart".  Sign up information is provided on this After Visit Summary.  MyChart is used to connect with patients for Virtual Visits (Telemedicine).  Patients are able to view lab/test results, encounter notes, upcoming appointments, etc.  Non-urgent messages can be sent to your provider as well.   To learn more about what you can do with MyChart, go to [email or web address].    Your next appointment:   6 week(s)  Provider:   Bernadene Person, NP

## 2023-05-14 NOTE — Progress Notes (Signed)
Office Visit    Patient Name: Christopher Ball Date of Encounter: 05/14/2023  Primary Care Provider:  Lorre Munroe, NP Primary Cardiologist:  Nanetta Batty, MD  Chief Complaint    52 year old male with a history of Marfan syndrome, thoracic aortic aneurysm s/p aortic root replacement and aortic valve replacement with bioprosthesis in 2007, hyperlipidemia, and tobacco use who presents for ED follow-up related to chest pain and shortness of breath.   Past Medical History    Past Medical History:  Diagnosis Date   Marfan syndrome    Stomach problems    Thoracic aortic aneurysm (HCC)    replacement by Dr. [name] /30/07 with aortic bioprosthetic valve   Tobacco abuse    Past Surgical History:  Procedure Laterality Date   AORTIC VALVE REPLACEMENT     COLONOSCOPY WITH PROPOFOL N/A 08/19/2022   Procedure: COLONOSCOPY WITH PROPOFOL;  Surgeon: Wyline Mood, MD;  Location: [facility] ENDOSCOPY;  Service: Gastroenterology;  Laterality: N/A;   VASECTOMY  09/16/2002    Allergies  Allergies  Allergen Reactions   Augmentin [Amoxicillin-Pot Clavulanate] Shortness Of Breath and Nausea And Vomiting     Labs/Other Studies Reviewed    The following studies were reviewed today:  Cardiac Studies & Procedures     STRESS TESTS  NM MYOCAR MULTI W/SPECT W 04/29/2006   ECHOCARDIOGRAM  ECHOCARDIOGRAM COMPLETE 08/06/2022  Narrative ECHOCARDIOGRAM REPORT    Patient Name:   SOULEYMANE Ball [facility] Date of Exam: 08/06/2022 Medical Rec #:  [id number]        Height:       78.0 in Accession #:    [id number]       Weight:       145.0 lb Date of Birth:  [birth year]        BSA:          1.969 m Patient Age:    51 years         BP:           124/82 mmHg Patient Gender: M                HR:           62 bpm. Exam Location:  [address]  Procedure: 2D Echo, Cardiac Doppler and Color Doppler  Indications:    I35.9 Aortic Valve Disorder  History:        Patient has prior history of Echocardiogram  examinations, most recent 09/04/2022. AVR-27 mm Medtronic Freestyle Bioprosthetic.; Risk Factors:Current Smoker. Marfan Syndrome. Aortic Valve: 27 mm Medtronic bioprosthetic valve is present in the aortic position. Procedure Date: 01/13/06.  Sonographer:    Sedonia Small Rodgers-Jones RDCS Referring Phys: [id number] TESSA N CONTE  IMPRESSIONS   1. S/P bioprothetic AVR with restricted right and left cusps; trace AI; mildly elevated mean gradient of 19 mmHg; AVA 1.3 cm2. Compared to12/20/22, mean gradient has increased. 2. Left ventricular ejection fraction, by estimation, is 60 to 65%. The left ventricle has normal function. The left ventricle has no regional wall motion abnormalities. Left ventricular diastolic parameters were normal. 3. Right ventricular systolic function is normal. The right ventricular size is normal. 4. The mitral valve is normal in structure. Trivial mitral valve regurgitation. No evidence of mitral stenosis. 5. The aortic valve has been repaired/replaced. Aortic valve regurgitation is trivial. Mild aortic valve stenosis. There is a 27 mm Medtronic bioprosthetic valve present in the aortic position. Procedure Date: 01/13/06. 6. Aortic dilatation noted. There is borderline dilatation of the aortic root, measuring  39 mm. 7. The inferior vena cava is normal in size with greater than 50% respiratory variability, suggesting right atrial pressure of 3 mmHg.  FINDINGS Left Ventricle: Left ventricular ejection fraction, by estimation, is 60 to 65%. The left ventricle has normal function. The left ventricle has no regional wall motion abnormalities. The left ventricular internal cavity size was normal in size. There is no left ventricular hypertrophy. Left ventricular diastolic parameters were normal.  Right Ventricle: The right ventricular size is normal. Right ventricular systolic function is normal.  Left Atrium: Left atrial size was normal in size.  Right Atrium: Right atrial size was  normal in size.  Pericardium: There is no evidence of pericardial effusion.  Mitral Valve: The mitral valve is normal in structure. Trivial mitral valve regurgitation. No evidence of mitral valve stenosis.  Tricuspid Valve: The tricuspid valve is normal in structure. Tricuspid valve regurgitation is mild . No evidence of tricuspid stenosis.  Aortic Valve: The aortic valve has been repaired/replaced. Aortic valve regurgitation is trivial. Mild aortic stenosis is present. Aortic valve mean gradient measures 18.8 mmHg. Aortic valve peak gradient measures 36.4 mmHg. Aortic valve area, by VTI measures 1.33 cm. There is a 27 mm Medtronic bioprosthetic valve present in the aortic position. Procedure Date: 01/13/06.  Pulmonic Valve: The pulmonic valve was normal in structure. Pulmonic valve regurgitation is trivial. No evidence of pulmonic stenosis.  Aorta: Aortic dilatation noted. There is borderline dilatation of the aortic root, measuring 39 mm.  Venous: The inferior vena cava is normal in size with greater than 50% respiratory variability, suggesting right atrial pressure of 3 mmHg.  IAS/Shunts: No atrial level shunt detected by color flow Doppler.  Additional Comments: S/P bioprothetic AVR with restricted right and left cusps; trace AI; mildly elevated mean gradient of 19 mmHg; AVA 1.3 cm2. Compared to12/20/22, mean gradient has increased.   LEFT VENTRICLE PLAX 2D LVIDd:         4.50 cm   Diastology LVIDs:         3.00 cm   LV e' medial:    11.40 cm/s LV PW:         1.00 cm   LV E/e' medial:  8.4 LV IVS:        1.00 cm   LV e' lateral:   14.00 cm/s LVOT diam:     2.10 cm   LV E/e' lateral: 6.8 LV SV:         86 LV SV Index:   44 LVOT Area:     3.46 cm   RIGHT VENTRICLE             IVC RV Basal diam:  4.00 cm     IVC diam: 1.40 cm RV S prime:     11.65 cm/s TAPSE (M-mode): 1.3 cm  LEFT ATRIUM             Index        RIGHT ATRIUM           Index LA diam:        3.70 cm 1.88 cm/m    RA Area:     12.70 cm LA Vol (A2C):   30.0 ml 15.24 ml/m  RA Volume:   36.10 ml  18.33 ml/m LA Vol (A4C):   34.5 ml 17.52 ml/m LA Biplane Vol: 32.1 ml 16.30 ml/m AORTIC VALVE AV Area (Vmax):    1.23 cm AV Area (Vmean):   1.25 cm AV Area (VTI):  1.33 cm AV Vmax:           301.80 cm/s AV Vmean:          199.000 cm/s AV VTI:            0.645 m AV Peak Grad:      36.4 mmHg AV Mean Grad:      18.8 mmHg LVOT Vmax:         107.50 cm/s LVOT Vmean:        71.950 cm/s LVOT VTI:          0.248 m LVOT/AV VTI ratio: 0.38  AORTA Ao Root diam: 3.90 cm  MITRAL VALVE               TRICUSPID VALVE MV Area (PHT): 3.72 cm    TR Peak grad:   13.1 mmHg MV Decel Time: 204 msec    TR Vmax:        181.00 cm/s MV E velocity: 95.85 cm/s MV A velocity: 41.45 cm/s  SHUNTS MV E/A ratio:  2.31        Systemic VTI:  0.25 m Systemic Diam: 2.10 cm  Olga Millers MD Electronically signed by Olga Millers MD Signature Date/Time: 08/06/2022/3:43:33 PM    Final            Recent Labs: 05/09/2023: ALT 26; B Natriuretic Peptide 978.2; BUN 18; Creatinine, Ser 1.21; Hemoglobin 12.9; Platelets 127; Potassium 4.4; Sodium 141  Recent Lipid Panel    Component Value Date/Time   CHOL 150 01/16/2023 0926   CHOL 166 08/24/2019 0942   TRIG 68 01/16/2023 0926   HDL 49 01/16/2023 0926   HDL 50 08/24/2019 0942   CHOLHDL 3.1 01/16/2023 0926   LDLCALC 86 01/16/2023 0926    History of Present Illness    52 year old male with the above past medical history including Marfan syndrome, 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  Cardiac catheterization in 2007 revealed normal coronary arteries, normal LV function.  He underwent thoracic aortic aneurysm resection and grafting with aortic valve replacement, coronary artery reimplantation by Dr. [name] in April 2007.   He was last seen in the office on  08/14/2021 and was stable from a cardiac standpoint.  He was seen virtually on 07/29/2022 for preoperative cardiac evaluation. Most recent echocardiogram in November 2023 showed EF 60 to 65%, normal LV function, no RWMA, normal RV, mild aortic stenosis, mean gradient 18.8 mmHg, with stable bioprosthetic valve, mean gradient slightly increased from prior study, mild dilation of the aortic root measuring 39 mm.  Repeat echocardiogram was recommended in 1 year.  He presented to the ED on 05/09/2023 in the setting of chest pain, shortness of breath.  Troponin was slightly elevated, BNP was elevated.  CTA chest was negative for dissection or aneurysmal dilation, there was evidence of mild coronary calcifications as well as small bilateral pleural effusions.  He was started on losartan-hydrochlorothiazide, and advised to follow-up with cardiology as an outpatient.   He presents today for follow-up accompanied by his wife. Since his last visit he has been stable from a cardiac standpoint.  He has only taken his losartan-hctz for 2 days. He has not noticed any improvement in his shortness of breath.  He describes orthopnea, associated chest tightness. He denies any exertional symptoms, denies PND, edema, or weight gain.  Home Medications    Current Outpatient Medications  Medication Sig Dispense Refill   atorvastatin (  LIPITOR) 20 MG tablet TAKE 1 TABLET (20 MG TOTAL) BY MOUTH DAILY. KEEP OFFICE VISIT FOR FUTURE REFILLS. 90 tablet 3   clindamycin (CLEOCIN) 300 MG capsule Take 2 tablets 1 hour prior to dental appointment 2 capsule 3   losartan-hydrochlorothiazide (HYZAAR) 50-12.5 MG tablet Take 1 tablet by mouth daily. 30 tablet 0   tadalafil (CIALIS) 20 MG tablet Take 1 tablet (20 mg total) by mouth daily. 10 tablet 1   Varenicline Tartrate, Starter, (CHANTIX STARTING MONTH PAK) 0.5 MG X 11 & 1 MG X 42 TBPK As directed 53 each 0   metoprolol tartrate (LOPRESSOR) 100 MG tablet Take one tablet 2 hours prior to  procedure. 1 tablet 0   No current facility-administered medications for this visit.     Review of Systems    He denies palpitations, pnd, orthopnea, n, v, dizziness, syncope, edema, weight gain, or early satiety. All other systems reviewed and are otherwise negative except as noted above.   Physical Exam    VS:  BP (!) 118/58 (BP Location: Left Arm, Patient Position: Sitting, Cuff Size: Normal)   Pulse 67   Ht 6\' 6"  (1.981 m)   Wt 150 lb 9.6 oz (68.3 kg)   SpO2 97%   BMI 17.40 kg/m  GEN: Well nourished, well developed, in no acute distress. HEENT: normal. Neck: Supple, no JVD, carotid bruits, or masses. Cardiac: RRR, 3/6 murmur, no rubs, or gallops. No clubbing, cyanosis, edema.  Radials/DP/PT 2+ and equal bilaterally.  Respiratory:  Respirations regular and unlabored, clear to auscultation bilaterally. GI: Soft, nontender, nondistended, BS + x 4. MS: no deformity or atrophy. Skin: warm and dry, no rash. Neuro:  Strength and sensation are intact. Psych: Normal affect.  Accessory Clinical Findings    ECG personally reviewed by me today -    - no EKG in office today.    Lab Results  Component Value Date   WBC 11.5 (H) 05/09/2023   HGB 12.9 (L) 05/09/2023   HCT 39.7 05/09/2023   MCV 101.0 (H) 05/09/2023   PLT 127 (L) 05/09/2023   Lab Results  Component Value Date   CREATININE 1.21 05/09/2023   BUN 18 05/09/2023   NA 141 05/09/2023   K 4.4 05/09/2023   CL 112 (H) 05/09/2023   CO2 24 05/09/2023   Lab Results  Component Value Date   ALT 26 05/09/2023   AST 22 05/09/2023   ALKPHOS 56 05/09/2023   BILITOT 1.2 05/09/2023   Lab Results  Component Value Date   CHOL 150 01/16/2023   HDL 49 01/16/2023   LDLCALC 86 01/16/2023   TRIG 68 01/16/2023   CHOLHDL 3.1 01/16/2023    Lab Results  Component Value Date   HGBA1C 5.7 (H) 01/16/2023    Assessment & Plan    1. Chest pain/shortness of breath/elevated troponin/coronary artery calcification on CT: Recent  episode of chest discomfort that prompted ED visit. BNP and troponin were elevated.  CTA chest was negative for dissection or aneurysmal dilation, there was evidence of mild coronary calcifications as well as small bilateral pleural effusions.  He continues to note orthopnea, with associated chest tightness and has not noticed any improvement in his symptoms with addition of losartan/HCTZ.  Will repeat BNP, BMET.  If BNP remains elevated, consider addition of low-dose Lasix.  Will repeat echo.  Will pursue coronary CT angiogram given evidence of mild coronary calcification on recent CT chest, elevated troponin, chest tightness and shortness of breath.  Reviewed ED precautions. If  cardiac workup unrevealing, consider pulmonary evaluation.  Continue Lipitor.  2. Thoracic aortic aneurysm/Marfan syndrome:  S/p thoracic aortic aneurysm resection and grafting with aortic valve replacement in 2007. Most recent echo in November 2023 showed EF 60 to 65%, normal LV function, no RWMA, normal RV, mild aortic stenosis, mean gradient 18.8 mmHg, with stable bioprosthetic valve, mean gradient slightly increased from prior study, mild dilation of the aortic root measuring 39 mm. Repeat echo pending as above.   3. Hyperlipidemia: LDL was 86 in 01/2023. If evidence of significant coronary calcification, consider increasing Lipitor for LDL goal less than 70.  4. Tobacco use: He continues to smoke.  Full cessation advised.  5. Disposition: Follow-up in 6-8 weeks.       Joylene Grapes, NP 05/14/2023, 5:21 PM

## 2023-05-15 ENCOUNTER — Telehealth: Payer: Self-pay

## 2023-05-15 NOTE — Transitions of Care (Post Inpatient/ED Visit) (Signed)
05/15/2023  Name: Christopher Ball MRN: [id number] DOB: September 04, 1971  Today's TOC FU Call Status: Today's TOC FU Call Status:: Successful TOC FU Call Completed TOC FU Call Complete Date: 05/15/23 Patient's Name and Date of Birth confirmed.   Red on EMMI-ED Discharge Alert Date & Reason:05/11/23 "Scheduled follow-up appt? No"   Transition Care Management Follow-up Telephone Call Date of Discharge: 05/09/23 Discharge Facility: [facility]) Type of Discharge: Emergency Department Reason for ED Visit: Other: ("orthopnea") How have you been since you were released from the hospital?: Better (Pt pleased to report "eveything back to normal." He has had no further issues or concerns since ED visit. Saw cardiology yest and appt went well. "BP back to normal.") Any questions or concerns?: No  Items Reviewed: Did you receive and understand the discharge instructions provided?: Yes Medications obtained,verified, and reconciled?: No Medications Not Reviewed Reasons:: Other: (pt states just saw cardiology yest afternoon-reviewed meds during appt-no changes since then) Any new allergies since your discharge?: No Dietary orders reviewed?: Yes Type of Diet Ordered:: low salt/heart healthy Do you have support at home?: Yes People in Home: spouse Name of Support/Comfort Primary Source: Tanya  Medications Reviewed Today: Medications Reviewed Today     Reviewed by Charlyn Minerva, RN (Registered Nurse) on 05/15/23 at [phone]  Med List Status: <None>   Medication Order Taking? Sig Documenting Provider Last Dose Status Informant  atorvastatin (LIPITOR) 20 MG tablet [id number] No TAKE 1 TABLET (20 MG TOTAL) BY MOUTH DAILY. KEEP OFFICE VISIT FOR FUTURE REFILLS. [name], MD Taking Active   clindamycin (CLEOCIN) 300 MG capsule [id number] No Take 2 tablets 1 hour prior to dental appointment [name], MD Taking Active   losartan-hydrochlorothiazide [facility]) 50-12.5 MG tablet  [id number] No Take 1 tablet by mouth daily. Loetta Rough, MD Taking Active   metoprolol tartrate (LOPRESSOR) 100 MG tablet [id number]  Take one tablet 2 hours prior to procedure. Joylene Grapes, NP  Active   tadalafil (CIALIS) 20 MG tablet [id number] No Take 1 tablet (20 mg total) by mouth daily. Lorre Munroe, NP Taking Active   Varenicline Tartrate, Starter, (CHANTIX STARTING MONTH PAK) 0.5 MG X 11 & 1 MG X 42 TBPK [id number] No As directed Lorre Munroe, NP Taking Active             Home Care and Equipment/Supplies: Were Home Health Services Ordered?: NA Any new equipment or medical supplies ordered?: NA  Functional Questionnaire: Do you need assistance with bathing/showering or dressing?: No Do you need assistance with meal preparation?: No Do you need assistance with eating?: No Do you have difficulty maintaining continence: No Do you need assistance with getting out of bed/getting out of a chair/moving?: No Do you have difficulty managing or taking your medications?: No  Follow up appointments reviewed: PCP Follow-up appointment confirmed?: No MD Provider Line Number:[phone] Given: No Specialist Hospital Follow-up appointment confirmed?: Yes Date of Specialist follow-up appointment?: 05/14/23 Follow-Up Specialty Provider:: Liberty Handy Do you need transportation to your follow-up appointment?: No Do you understand care options if your condition(s) worsen?: Yes-patient verbalized understanding  SDOH Interventions Today    Flowsheet Row Most Recent Value  SDOH Interventions   Food Insecurity Interventions Intervention Not Indicated  Transportation Interventions Intervention Not Indicated      TOC Interventions Today    Flowsheet Row Most Recent Value  TOC Interventions   TOC Interventions Discussed/Reviewed TOC Interventions Discussed      Interventions Today  Flowsheet Row Most Recent Value  Chronic Disease   Chronic disease during today's visit  Hypertension (HTN)  General Interventions   General Interventions Discussed/Reviewed General Interventions Discussed, Doctor Visits, Durable Medical Equipment (DME)  Doctor Visits Discussed/Reviewed Doctor Visits Discussed, PCP, Specialist  Durable Medical Equipment (DME) BP Cuff  PCP/Specialist Visits Compliance with follow-up visit  Education Interventions   Education Provided Provided Education  Provided Verbal Education On Nutrition, When to see the doctor, Medication, Other  [sx mgmt]  Nutrition Interventions   Nutrition Discussed/Reviewed Nutrition Discussed  Pharmacy Interventions   Pharmacy Dicussed/Reviewed Pharmacy Topics Discussed, Medications and their functions       [facility] Care Management Care Management Community Coordinator Direct Phone: [phone] Toll Free: [phone] Fax: [phone]

## 2023-05-20 ENCOUNTER — Encounter (HOSPITAL_COMMUNITY): Payer: Self-pay

## 2023-05-21 ENCOUNTER — Telehealth (HOSPITAL_COMMUNITY): Payer: Self-pay | Admitting: *Deleted

## 2023-05-21 NOTE — Telephone Encounter (Signed)
Reaching out to patient to offer assistance regarding upcoming cardiac imaging study; pt verbalizes understanding of appt date/time, parking situation and where to check in, pre-test NPO status and medications ordered, and verified current allergies; name and call back number provided for further questions should they arise Hayley Sharpe RN Navigator Cardiac Imaging [facility] [phone] office [phone] cell  

## 2023-05-22 ENCOUNTER — Ambulatory Visit (HOSPITAL_COMMUNITY)
Admission: RE | Admit: 2023-05-22 | Discharge: 2023-05-22 | Disposition: A | Discharge status: Home / Self Care | Payer: Federal, State, Local not specified - PPO | Source: Ambulatory Visit | Attending: Nurse Practitioner | Admitting: Nurse Practitioner

## 2023-05-22 DIAGNOSIS — R0602 Shortness of breath: Secondary | ICD-10-CM

## 2023-05-22 DIAGNOSIS — R072 Precordial pain: Secondary | ICD-10-CM | POA: Diagnosis not present

## 2023-05-22 DIAGNOSIS — Q8740 Marfan's syndrome, unspecified: Secondary | ICD-10-CM

## 2023-05-22 MED ORDER — IOHEXOL 350 MG/ML SOLN
95.0000 mL | Freq: Once | INTRAVENOUS | Status: AC | PRN
  Administered 2023-05-22: 95 mL via INTRAVENOUS

## 2023-05-22 MED ORDER — NITROGLYCERIN 0.4 MG SL SUBL
0.8000 mg | SUBLINGUAL_TABLET | Freq: Once | SUBLINGUAL | Status: AC
  Administered 2023-05-22: 0.8 mg via SUBLINGUAL

## 2023-05-22 MED ORDER — NITROGLYCERIN 0.4 MG SL SUBL
SUBLINGUAL_TABLET | SUBLINGUAL | Status: AC
  Filled 2023-05-22: qty 2

## 2023-06-05 ENCOUNTER — Ambulatory Visit (HOSPITAL_COMMUNITY): Payer: Federal, State, Local not specified - PPO | Attending: Internal Medicine

## 2023-06-05 DIAGNOSIS — Q8740 Marfan's syndrome, unspecified: Secondary | ICD-10-CM | POA: Diagnosis not present

## 2023-06-05 DIAGNOSIS — Z952 Presence of prosthetic heart valve: Secondary | ICD-10-CM

## 2023-06-05 DIAGNOSIS — R072 Precordial pain: Secondary | ICD-10-CM

## 2023-06-05 DIAGNOSIS — R0602 Shortness of breath: Secondary | ICD-10-CM | POA: Diagnosis not present

## 2023-06-05 LAB — ECHOCARDIOGRAM COMPLETE
AR max vel: 1.09 cm2
AV Area VTI: 1.22 cm2
AV Area mean vel: 1.1 cm2
AV Mean grad: 27 mmHg
AV Peak grad: 44.1 mmHg
Ao pk vel: 3.32 m/s
Area-P 1/2: 9.98 cm2
P 1/2 time: 226 msec
S' Lateral: 3.7 cm

## 2023-06-20 ENCOUNTER — Telehealth: Payer: Self-pay

## 2023-06-20 NOTE — Telephone Encounter (Signed)
Spoke with pt. Pt was notified of echo results and will discuss further at his apt next week.

## 2023-06-23 ENCOUNTER — Encounter: Payer: Self-pay | Admitting: Nurse Practitioner

## 2023-06-23 ENCOUNTER — Ambulatory Visit: Payer: Federal, State, Local not specified - PPO | Attending: Nurse Practitioner | Admitting: Nurse Practitioner

## 2023-06-23 ENCOUNTER — Other Ambulatory Visit: Payer: Self-pay

## 2023-06-23 VITALS — BP 130/50 | HR 71 | Ht 78.0 in | Wt 155.0 lb

## 2023-06-23 DIAGNOSIS — E782 Mixed hyperlipidemia: Secondary | ICD-10-CM

## 2023-06-23 DIAGNOSIS — R0602 Shortness of breath: Secondary | ICD-10-CM

## 2023-06-23 DIAGNOSIS — I712 Thoracic aortic aneurysm, without rupture, unspecified: Secondary | ICD-10-CM

## 2023-06-23 DIAGNOSIS — Z720 Tobacco use: Secondary | ICD-10-CM

## 2023-06-23 DIAGNOSIS — Z952 Presence of prosthetic heart valve: Secondary | ICD-10-CM

## 2023-06-23 DIAGNOSIS — I351 Nonrheumatic aortic (valve) insufficiency: Secondary | ICD-10-CM

## 2023-06-23 DIAGNOSIS — I350 Nonrheumatic aortic (valve) stenosis: Secondary | ICD-10-CM

## 2023-06-23 DIAGNOSIS — Q8740 Marfan's syndrome, unspecified: Secondary | ICD-10-CM

## 2023-06-23 LAB — SPECIMEN STATUS REPORT

## 2023-06-23 MED ORDER — FUROSEMIDE 20 MG PO TABS: 20.0000 mg | ORAL_TABLET | Freq: Every day | ORAL | 3 refills | Status: DC

## 2023-06-23 NOTE — Progress Notes (Signed)
Office Visit    Patient Name: Christopher Ball Date of Encounter: 06/23/2023  Primary Care Provider:  Lorre Munroe, NP Primary Cardiologist:  Nanetta Batty, MD  Chief Complaint    52 year old male with a history of Marfan syndrome, thoracic aortic aneurysm s/p aortic root replacement and aortic valve replacement with bioprosthesis in 2007, hyperlipidemia, and tobacco use who presents for ED follow-up related to shortness of breath and aortic valve disorder.    Past Medical History    Past Medical History:  Diagnosis Date   Marfan syndrome    Stomach problems    Thoracic aortic aneurysm (HCC)    replacement by Dr. [name] /30/07 with aortic bioprosthetic valve   Tobacco abuse    Past Surgical History:  Procedure Laterality Date   AORTIC VALVE REPLACEMENT     COLONOSCOPY WITH PROPOFOL N/A 08/19/2022   Procedure: COLONOSCOPY WITH PROPOFOL;  Surgeon: Wyline Mood, MD;  Location: [facility] ENDOSCOPY;  Service: Gastroenterology;  Laterality: N/A;   VASECTOMY  09/16/2002    Allergies  Allergies  Allergen Reactions   Augmentin [Amoxicillin-Pot Clavulanate] Shortness Of Breath and Nausea And Vomiting     Labs/Other Studies Reviewed    The following studies were reviewed today:  Cardiac Studies & Procedures     STRESS TESTS  NM MYOCAR MULTI W/SPECT W 04/29/2006   ECHOCARDIOGRAM  ECHOCARDIOGRAM COMPLETE 06/05/2023  Narrative ECHOCARDIOGRAM REPORT    Patient Name:   Christopher Ball [facility] Date of Exam: 06/05/2023 Medical Rec #:  [id number]        Height:       78.0 in Accession #:    [id number]       Weight:       150.6 lb Date of Birth:  [birth year]        BSA:          2.001 m Patient Age:    52 years         BP:           148/94 mmHg Patient Gender: M                HR:           72 bpm. Exam Location:  [address]  Procedure: 2D Echo, 3D Echo, Cardiac Doppler and Color Doppler  Indications:    R06.00 SOB  History:        Patient has prior history of  Echocardiogram examinations, most recent 08/06/2022. Signs/Symptoms:Chest Pain; Risk Factors:HLD and Current Smoker. Marfan syndrome.  Sonographer:    Clearence Ped RCS Referring Phys: [phone] Jaki Hammerschmidt C Renay Crammer  IMPRESSIONS   1. Left ventricular ejection fraction, by estimation, is 55 to 60%. The left ventricle has normal function. The left ventricle has no regional wall motion abnormalities. There is mild left ventricular hypertrophy. Left ventricular diastolic parameters are indeterminate. The average left ventricular global longitudinal strain is -16.0 %. 2. Right ventricular systolic function is normal. The right ventricular size is normal. There is moderately elevated pulmonary artery systolic pressure. 3. Possible PFO/ASD Qp/QS 0.5. 4. The mitral valve is grossly normal. Trivial mitral valve regurgitation. 5. S/p bioprosthetic AVR with known restricted R/L cusps. eccentric AI. PHT 198 ms. Has holodiastolic flow reversal. Aortic valve regurgitation is moderate to severe. Moderate aortic valve stenosis. Procedure Date: 12/2005. 6. The inferior vena cava is dilated in size with <50% respiratory variability, suggesting right atrial pressure of 15 mmHg.  Conclusion(s)/Recommendation(s): Compared to prior study, AI has progressed. Based on the above  findings, recommend TEE.  FINDINGS Left Ventricle: Left ventricular ejection fraction, by estimation, is 55 to 60%. The left ventricle has normal function. The left ventricle has no regional wall motion abnormalities. The average left ventricular global longitudinal strain is -16.0 %. The left ventricular internal cavity size was normal in size. There is mild left ventricular hypertrophy. Left ventricular diastolic parameters are indeterminate. The ratio of pulmonic flow to systemic flow (Qp/Qs ratio) is 0.50.  Right Ventricle: The right ventricular size is normal. Right ventricular systolic function is normal. There is moderately elevated pulmonary  artery systolic pressure. The tricuspid regurgitant velocity is 2.82 m/s, and with an assumed right atrial pressure of 15 mmHg, the estimated right ventricular systolic pressure is 46.8 mmHg.  Left Atrium: Left atrial size was normal in size.  Right Atrium: Right atrial size was normal in size.  Pericardium: There is no evidence of pericardial effusion.  Mitral Valve: The mitral valve is grossly normal. Trivial mitral valve regurgitation.  Tricuspid Valve: The tricuspid valve is normal in structure. Tricuspid valve regurgitation is mild.  Aortic Valve: S/p bioprosthetic AVR with known restricted R/L cusps. eccentric AI. PHT 198 ms. Has holodiastolic flow reversal. Aortic valve regurgitation is moderate to severe. Aortic regurgitation PHT measures 226 msec. Moderate aortic stenosis is present. Aortic valve mean gradient measures 27.0 mmHg. Aortic valve peak gradient measures 44.1 mmHg. Aortic valve area, by VTI measures 1.22 cm. There is a 27 mm Medtronic bioprosthetic valve present in the aortic position.  Pulmonic Valve: Pulmonic valve regurgitation is trivial.  Aorta: The aortic root and ascending aorta are structurally normal, with no evidence of dilitation.  Venous: The inferior vena cava is dilated in size with less than 50% respiratory variability, suggesting right atrial pressure of 15 mmHg.  IAS/Shunts: The ratio of pulmonic flow to systemic flow (Qp/Qs ratio) is 0.50.   LEFT VENTRICLE PLAX 2D LVIDd:         5.40 cm   Diastology LVIDs:         3.70 cm   LV e' medial:    12.60 cm/s LV PW:         1.40 cm   LV E/e' medial:  3.4 LV IVS:        1.20 cm   LV e' lateral:   12.70 cm/s LVOT diam:     2.00 cm   LV E/e' lateral: 3.3 LV SV:         81 LV SV Index:   40        2D Longitudinal Strain LVOT Area:     3.14 cm  2D Strain GLS (A2C):   -17.4 % 2D Strain GLS (A3C):   -14.0 % 2D Strain GLS (A4C):   -16.7 % 2D Strain GLS Avg:     -16.0 %  RIGHT VENTRICLE RV Basal diam:   4.10 cm RV S prime:     8.59 cm/s RVOT diam:      2.40 cm TAPSE (M-mode): 1.8 cm RVSP:           34.8 mmHg  LEFT ATRIUM              Index        RIGHT ATRIUM           Index LA diam:        4.20 cm  2.10 cm/m   RA Pressure: 3.00 mmHg LA Vol (A2C):   112.0 ml 55.97 ml/m  RA Area:     19.40  cm LA Vol (A4C):   67.5 ml  33.73 ml/m  RA Volume:   56.70 ml  28.33 ml/m LA Biplane Vol: 86.8 ml  43.38 ml/m AORTIC VALVE                     PULMONIC VALVE AV Area (Vmax):    1.09 cm      RVOT Peak grad: 1 mmHg AV Area (Vmean):   1.10 cm AV Area (VTI):     1.22 cm AV Vmax:           332.00 cm/s AV Vmean:          240.000 cm/s AV VTI:            0.664 m AV Peak Grad:      44.1 mmHg AV Mean Grad:      27.0 mmHg LVOT Vmax:         115.00 cm/s LVOT Vmean:        84.000 cm/s LVOT VTI:          0.257 m LVOT/AV VTI ratio: 0.39 AI PHT:            226 msec  AORTA Ao Root diam: 3.50 cm Ao Asc diam:  2.40 cm  MITRAL VALVE                TRICUSPID VALVE MV Area (PHT):              TR Peak grad:   31.8 mmHg MV Decel Time:              TR Vmax:        282.00 cm/s MV E velocity: 42.40 cm/s   Estimated RAP:  3.00 mmHg MV A velocity: 107.00 cm/s  RVSP:           34.8 mmHg MV E/A ratio:  0.40 SHUNTS Systemic VTI:  0.26 m Systemic Diam: 2.00 cm Pulmonic VTI:  0.098 m Pulmonic Diam: 2.40 cm Qp/Qs:         0.55  Carolan Clines Electronically signed by Carolan Clines Signature Date/Time: 06/05/2023/11:17:43 AM    Final     CT SCANS  CT CORONARY MORPH W/CTA COR W/SCORE 05/22/2023  Addendum 06/02/2023 10:16 AM ADDENDUM REPORT: 06/02/2023 10:13  EXAM: OVER-READ INTERPRETATION CT CHEST  The following report is an over-read performed by radiologist Dr. Lesia Hausen [facility] on 06/02/2023. This over-read does not include interpretation of cardiac or coronary anatomy or pathology. The coronary CTA interpretation by the cardiologist is attached.  COMPARISON:  CT chest dated  05/09/2023  FINDINGS: Cardiovascular: Vascular calcifications are seen in the thoracic aorta. The heart is enlarged.  Mediastinum/Nodes: No enlarged mediastinal lymph nodes. The visible trachea and esophagus demonstrate no significant findings.  Lungs/Pleura: There is a moderate left and a small right pleural effusion with associated atelectasis. Emphysematous changes are noted.  Upper Abdomen: No acute abnormality.  Musculoskeletal: No chest wall mass or suspicious bone lesions identified.  IMPRESSION: Moderate left and small right pleural effusions with associated atelectasis.  Aortic Atherosclerosis (ICD10-I70.0) and Emphysema (ICD10-J43.9).   Electronically Signed By: Romona Curls M.D. On: 06/02/2023 10:13  Narrative CLINICAL DATA:  52 Year-old Male  EXAM: Cardiac/Coronary  CTA  TECHNIQUE: The patient was scanned on a [employer].  FINDINGS: Scan was triggered in the descending thoracic aorta. Axial non-contrast 3 mm slices were carried out through the heart. The data set was analyzed on a dedicated work station and  scored using the Agatson method. Gantry rotation speed was 250 msecs and collimation was .6 mm. 0.8 mg of sl NTG was given. The 3D data set was reconstructed in 5% intervals of the 67-82 % of the R-R cycle. Diastolic phases were analyzed on a dedicated work station using MPR, MIP and VRT modes. The patient received 95 cc of contrast.  Coronary Arteries:  Normal coronary origin. Right dominance.  Coronary Calcium Score:  Left main: 0  Left anterior descending artery: 0  Left circumflex artery: 0  Right coronary artery: 0  Total: 0  Percentile: 1st for age, sex, and race matched control.  Plaque Analysis:  Left main: 51 cubic mm non calcified plaque- calcified plaque picked up by imaging is related to reimplantation.  Right coronary artery: 20 cubic mm non calcified plaque- calcified plaque picked up by imaging is related  to reimplantation.  Total: 71  RCA is a large dominant artery that gives rise to PDA and PLA. There is no significant plaque.  Left main is a large artery that gives rise to LAD and LCX arteries. There is no significant plaque.  LAD is a large vessel that gives rise to one two diagonals. There is no significant plaque.  LCX is a non-dominant artery that gives rise to one large OM1 branch. There is no significant plaque.  Other findings:  Aorta: Prior thoracic aortic resection graft with Medtronic Freestyle Valve. Mild dilation of aortic root, 41 mm by sinus to sinus dimension. Aortic atherosclerosis. No dissection.  Main Pulmonary Artery: Not well visualized in this acquisition.  Systemic Veins: Normal drainage  Aortic Valve: Post 27 mm Medtronic Freestyle Valve. There is Grade 1 hypoattenuation leaflet thickening of the right leaflet. This study was not optimized for valve assessment.  Mitral valve: No calcification or stenosis.  There is abnormal pulmonary vein drainage: Partial anomalous pulmonary venous return- the right upper pulmonary venous return into the posterior-inferior portion of the superior vena cava.  Normal left atrial appendage without a thrombus. Chicken wing morphology.  Interatrial septum notable for left to right shunt concerning for an ASD.  Left Ventricle: Normal size  Left Atrium: Normal size  Right Ventricle: Dilated.  RV basal dimension 54 mm.  Right Atrium: Dilated.  Pericardium: Normal thickness  Extra-cardiac findings: See attached radiology report for non-cardiac structures.  IMPRESSION: 1. Coronary calcium score of 0. This was 1st percentile for age, sex, and race matched control. Total plaque volume of 71, this is greater than the 10th percentile for age and gender.  2. Normal coronary origin with right dominance.  3. Prior thoracic aortic root graft with coronary implantation and 27 mm Medtronic Freestyle valve. Mild  HALT. Mild root dilation. Repeat imaging in one year is recommended.  4. 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 Discussed with primary cardiologist.  5. CAD-RADS 0. No evidence of CAD (0%). Consider non-atherosclerotic causes of chest pain.  RECOMMENDATIONS: RECOMMENDATIONS The proposed cut-off value of 1,651 AU yielded a 93 % sensitivity and 75 % specificity in grading AS severity in patients with classical low-flow, low-gradient AS. Proposed different cut-off values to define severe AS for men and women as 2,065 AU and 1,274 AU, respectively. The joint European and American recommendations for the assessment of AS consider the aortic valve calcium score as a continuum - a very high calcium score suggests severe AS and a low calcium score suggests severe AS  is unlikely.  Sunday Shams, et al. 2017 ESC/EACTS Guidelines for the management of valvular heart disease. Eur Heart J 2017;38:2739-91.  Coronary artery calcium (CAC) score is a strong predictor of incident coronary heart disease (CHD) and provides predictive information beyond traditional risk factors. CAC scoring is reasonable to use in the decision to withhold, postpone, or initiate statin therapy in intermediate-risk or selected borderline-risk asymptomatic adults (age 56-75 years and LDL-C >=70 to <190 mg/dL) who do not have diabetes or established atherosclerotic cardiovascular disease (ASCVD).* In intermediate-risk (10-year ASCVD risk >=7.5% to <20%) adults or selected borderline-risk (10-year ASCVD risk >=5% to <7.5%) adults in whom a CAC score is measured for the purpose of making a treatment decision the following recommendations have been made:  If CAC = 0, it is reasonable to withhold statin therapy and reassess in 5 to 10 years, as long as higher risk conditions are absent (diabetes  mellitus, family history of premature CHD in first degree relatives (males <55 years; females <65 years), cigarette smoking, LDL >=190 mg/dL or other independent risk factors).  If CAC is 1 to 99, it is reasonable to initiate statin therapy for patients >=103 years of age.  If CAC is >=100 or >=75th percentile, it is reasonable to initiate statin therapy at any age.  Cardiology referral should be considered for patients with CAC scores =400 or >=75th percentile.  *2018 AHA/ACC/AACVPR/AAPA/ABC/ACPM/ADA/AGS/APhA/ASPC/NLA/PCNA Guideline on the Management of Blood Cholesterol: A Report of the American College of Cardiology/American Heart Association Task Force on Clinical Practice Guidelines. J Am Coll Cardiol. 2019;73(24):3168-3209.  Riley Lam, MD  Electronically Signed: By: Riley Lam M.D. 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   Cardiac catheterization in 2007 revealed normal coronary arteries, normal LV function.  He underwent thoracic aortic aneurysm resection and grafting with aortic valve replacement, coronary artery reimplantation by Dr. [name] in April 2007.  Most recent echocardiogram in November 2023 showed EF 60 to 65%, normal LV function, no RWMA, normal RV, mild aortic stenosis, mean gradient 18.8 mmHg, with stable  bioprosthetic valve, mean gradient slightly increased from prior study, mild dilation of the aortic root measuring 39 mm.  Repeat echocardiogram was recommended in 1 year.  He presented to the ED on 05/09/2023 in the setting of chest pain, shortness of breath.  Troponin was slightly elevated, BNP was elevated.  CTA chest was negative for dissection or aneurysmal dilation, there was evidence of mild coronary calcifications as well as small bilateral pleural effusions.  He was started on losartan-hydrochlorothiazide, and advised to follow-up with cardiology as an outpatient. He was last seen in the office on 05/14/2023 and noted ongoing shortness of breath with associated chest tightness, orthopnea.  Coronary CT angiogram revealed coronary calcium score of 0, no evidence of CAD, mild dilation of  the aortic root measuring 41 mm. Repeat echocardiogram showed EF 55 to 60%, normal function, no RWMA, mild LVH, normal RV, moderately elevated PASP, moderate to severe aortic valve regurgitation, moderate aortic stenosis s/p bioprosthetic AVR.  Follow-up TEE was recommended given progression of AI.  He presents today for follow-up accompanied by his wife. Since his last visit he has been stable from a cardiac standpoint.  He continues to note intermittent shortness of breath, orthopnea, denies edema, PND, denies chest pain.  He notes a persistent cough in the mornings, along with some postnasal drip, this has been ongoing but he feels that his cough in the mornings has gotten worse.  He continues to smoke both cigarettes and marijuana.   Home Medications    Current Outpatient Medications  Medication Sig Dispense Refill   atorvastatin (LIPITOR) 20 MG tablet TAKE 1 TABLET (20 MG TOTAL) BY MOUTH DAILY. KEEP OFFICE VISIT FOR FUTURE REFILLS. 90 tablet 3   clindamycin (CLEOCIN) 300 MG capsule Take 2 tablets 1 hour prior to dental appointment 2 capsule 3   furosemide (LASIX) 20 MG tablet Take 1 tablet (20 mg total) by mouth  daily. 90 tablet 3   tadalafil (CIALIS) 20 MG tablet Take 1 tablet (20 mg total) by mouth daily. 10 tablet 1   fluticasone (FLONASE) 50 MCG/ACT nasal spray Place 1 spray into both nostrils daily as needed for rhinitis.     levocetirizine (XYZAL) 5 MG tablet Take 5 mg by mouth every evening.     Multiple Vitamins-Minerals (MULTIVITAMIN WITH MINERALS) tablet Take 1 tablet by mouth daily.     No current facility-administered medications for this visit.     Review of Systems    He denies chest pain, palpitations, pnd, n, v, dizziness, syncope, edema, weight gain, or early satiety. All other systems reviewed and are otherwise negative except as noted above.   Physical Exam    VS:  BP (!) 130/50 (BP Location: Left Arm, Patient Position: Sitting, Cuff Size: Normal)   Pulse 71   Ht 6\' 6"  (1.981 m)   Wt 155 lb (70.3 kg)   SpO2 98%   BMI 17.91 kg/m   GEN: Well nourished, well developed, in no acute distress. HEENT: normal. Neck: Supple, no JVD, carotid bruits, or masses. Cardiac: RRR, 2/6 murmur, no rubs, or gallops. No clubbing, cyanosis, edema.  Radials/DP/PT 2+ and equal bilaterally.  Respiratory:  Respirations regular and unlabored, clear to auscultation bilaterally. GI: Soft, nontender, nondistended, BS + x 4. MS: no deformity or atrophy. Skin: warm and dry, no rash. Neuro:  Strength and sensation are intact. Psych: Normal affect.  Accessory Clinical Findings    ECG personally reviewed by me today - EKG Interpretation Date/Time:  Monday June 23 2023 10:18:10 EDT Ventricular Rate:  71 PR Interval:  146 QRS Duration:  86 QT Interval:  406 QTC Calculation: 441 R Axis:   49  Text Interpretation: Normal sinus rhythm Possible Left atrial enlargement Left ventricular hypertrophy ( Sokolow-Lyon , Cornell product ) Nonspecific T wave abnormality Confirmed by Bernadene Person ([id number]) on 06/23/2023 10:24:37 AM  - no acute changes.   Lab Results  Component Value Date   WBC 11.5 (H)  05/09/2023   HGB 12.9 (L) 05/09/2023   HCT 39.7 05/09/2023   MCV 101.0 (H) 05/09/2023   PLT 127 (L) 05/09/2023   Lab Results  Component Value Date   CREATININE 1.21 05/09/2023   BUN 18 05/09/2023   NA 141 05/09/2023   K 4.4 05/09/2023  CL 112 (H) 05/09/2023   CO2 24 05/09/2023   Lab Results  Component Value Date   ALT 26 05/09/2023   AST 22 05/09/2023   ALKPHOS 56 05/09/2023   BILITOT 1.2 05/09/2023   Lab Results  Component Value Date   CHOL 150 01/16/2023   HDL 49 01/16/2023   LDLCALC 86 01/16/2023   TRIG 68 01/16/2023   CHOLHDL 3.1 01/16/2023    Lab Results  Component Value Date   HGBA1C 5.7 (H) 01/16/2023    Assessment & Plan    1. Thoracic aortic aneurysm/AI/Aortic stenosis/shortness of breath/Marfan syndrome:  S/p thoracic aortic aneurysm resection and grafting with aortic valve replacement in 2007. Recent episode of chest discomfort that prompted ED visit. BNP and troponin were elevated.  CTA chest was negative for dissection or aneurysmal dilation, there was evidence of mild coronary calcifications as well as small bilateral pleural effusions. Coronary CT angiogram revealed coronary calcium score of 0, no evidence of CAD, mild dilation of the aortic root measuring 41 mm, as well as evidence of emphysema, small bilateral pleural effusions. Repeat echocardiogram showed EF 55 to 60%, normal function, no RWMA, mild LVH, normal RV, moderately elevated PASP, moderate to severe aortic valve regurgitation, moderate aortic stenosis s/p bioprosthetic AVR. Follow-up TEE was recommended given progression of AI.  We discussed this recommendation at today's visit, he is agreeable to proceed.  Will check CBC, BMET, BNP today.  Will stop losartan-HCTZ (he has not been taking this for the past month).   Will start Lasix 20 mg daily. Will repeat BMET in 1-2 weeks. Continue to monitor symptoms. TEE has been scheduled for 06/25/2023 with Dr. Duke Salvia.   Informed Consent   Shared Decision  Making/Informed Consent The risks [esophageal damage, perforation (1:10,000 risk), bleeding, pharyngeal hematoma as well as other potential complications associated with conscious sedation including aspiration, arrhythmia, respiratory failure and death], benefits (treatment guidance and diagnostic support) and alternatives of a transesophageal echocardiogram were discussed in detail with Mr. Khoury and he is willing to proceed.     2. Hyperlipidemia: LDL was 86 in 01/2023. Continue Lipitor.    3. Tobacco use: He continues to smoke.  Full cessation advised.   4. Disposition: Follow-up in 1 month.       Joylene Grapes, NP 06/23/2023, 12:15 PM

## 2023-06-23 NOTE — Patient Instructions (Addendum)
Medication Instructions:  Stop Losartan/Hydrochlorothiazide.  Start Lasix (Furosemide) 20 mg daily.  *If you need a refill on your cardiac medications before your next appointment, please call your pharmacy*   Lab Work: CBC, BMET, BNP today. BMET in 1-2 weeks.   Testing/Procedures: Your physician has requested that you have a TEE. During a TEE, sound waves are used to create images of your heart. It provides your doctor with information about the size and shape of your heart and how well your heart's chambers and valves are working. In this test, a transducer is attached to the end of a flexible tube that's guided down your throat and into your esophagus (the tube leading from you mouth to your stomach) to get a more detailed image of your heart. You are not awake for the procedure. Please see the instruction sheet given to you today. For further information please visit [email or web address]      Follow-Up: At [facility], you and your health needs are our priority.  As part of our continuing mission to provide you with exceptional heart care, we have created designated Provider Care Teams.  These Care Teams include your primary Cardiologist (physician) and Advanced Practice Providers (APPs -  Physician Assistants and Nurse Practitioners) who all work together to provide you with the care you need, when you need it.  We recommend signing up for the patient portal called "MyChart".  Sign up information is provided on this After Visit Summary.  MyChart is used to connect with patients for Virtual Visits (Telemedicine).  Patients are able to view lab/test results, encounter notes, upcoming appointments, etc.  Non-urgent messages can be sent to your provider as well.   To learn more about what you can do with MyChart, go to [email or web address].    Your next appointment:   1 month(s)  Provider:   Bernadene Person, NP        Other Instructions     Dear Christopher Ball  You are  scheduled for a TEE (Transesophageal Echocardiogram) on Wednesday, October 9 with Dr. Duke Salvia.  Please arrive at the [facility] (Main Entrance A) at [facility]: [address] [place], [place] [address] at 11:00 AM (This time is 1 hour(s) before your procedure to ensure your preparation). Free valet parking service is available. You will check in at ADMITTING. The support person will be asked to wait in the waiting room.  It is OK to have someone drop you off and come back when you are ready to be discharged.      DIET:  Nothing to eat or drink after midnight except a sip of water with medications (see medication instructions below)  MEDICATION INSTRUCTIONS: !!IF ANY NEW MEDICATIONS ARE STARTED AFTER TODAY, PLEASE NOTIFY YOUR PROVIDER AS SOON AS POSSIBLE!!  FYI: Medications such as Semaglutide (Ozempic, [place]), Tirzepatide (Mounjaro, Zepbound), Dulaglutide (Trulicity), etc ("GLP1 agonists") AND Canagliflozin (Invokana), Dapagliflozin (Farxiga), Empagliflozin (Jardiance), Ertugliflozin (Steglatro), Bexagliflozin [employer]) or any combination with one of these drugs such as Invokamet (Canagliflozin/Metformin), Synjardy (Empagliflozin/Metformin), etc ("SGLT2 inhibitors") must be held around the time of a procedure. This is not a comprehensive list of all of these drugs. Please review all of your medications and talk to your provider if you take any one of these. If you are not sure, ask your provider.        LABS: {TIP  Labs   If patient is on Coumadin, they need PT/INR, CBC, BMET within 3 days (No  Being completed today  FYI:  For your safety, and to allow [place] to monitor your vital signs accurately during the surgery/procedure we request: If you have artificial nails, gel coating, SNS etc, please have those removed prior to your surgery/procedure. Not having the nail coverings /polish removed may result in cancellation or delay of your surgery/procedure.  You must have a responsible  person to drive you home and stay in the waiting area during your procedure. Failure to do so could result in cancellation.  Bring your insurance cards.  *Special Note: Every effort is made to have your procedure done on time. Occasionally there are emergencies that occur at the hospital that may cause delays. Please be patient if a delay does occur.

## 2023-06-23 NOTE — H&P (View-Only) (Signed)
Office Visit    Patient Name: Christopher Ball Date of Encounter: 06/23/2023  Primary Care Provider:  Lorre Munroe, NP Primary Cardiologist:  Nanetta Batty, MD  Chief Complaint    52 year old male with a history of Marfan syndrome, thoracic aortic aneurysm s/p aortic root replacement and aortic valve replacement with bioprosthesis in 2007, hyperlipidemia, and tobacco use who presents for ED follow-up related to shortness of breath and aortic valve disorder.    Past Medical History    Past Medical History:  Diagnosis Date   Marfan syndrome    Stomach problems    Thoracic aortic aneurysm (HCC)    replacement by Dr. [name] /30/07 with aortic bioprosthetic valve   Tobacco abuse    Past Surgical History:  Procedure Laterality Date   AORTIC VALVE REPLACEMENT     COLONOSCOPY WITH PROPOFOL N/A 08/19/2022   Procedure: COLONOSCOPY WITH PROPOFOL;  Surgeon: Wyline Mood, MD;  Location: [facility] ENDOSCOPY;  Service: Gastroenterology;  Laterality: N/A;   VASECTOMY  09/16/2002    Allergies  Allergies  Allergen Reactions   Augmentin [Amoxicillin-Pot Clavulanate] Shortness Of Breath and Nausea And Vomiting     Labs/Other Studies Reviewed    The following studies were reviewed today:  Cardiac Studies & Procedures     STRESS TESTS  NM MYOCAR MULTI W/SPECT W 04/29/2006   ECHOCARDIOGRAM  ECHOCARDIOGRAM COMPLETE 06/05/2023  Narrative ECHOCARDIOGRAM REPORT    Patient Name:   Christopher Ball [facility] Date of Exam: 06/05/2023 Medical Rec #:  [id number]        Height:       78.0 in Accession #:    [id number]       Weight:       150.6 lb Date of Birth:  [birth year]        BSA:          2.001 m Patient Age:    52 years         BP:           148/94 mmHg Patient Gender: M                HR:           72 bpm. Exam Location:  [address]  Procedure: 2D Echo, 3D Echo, Cardiac Doppler and Color Doppler  Indications:    R06.00 SOB  History:        Patient has prior history of  Echocardiogram examinations, most recent 08/06/2022. Signs/Symptoms:Chest Pain; Risk Factors:HLD and Current Smoker. Marfan syndrome.  Sonographer:    Clearence Ped RCS Referring Phys: [phone] Jaki Hammerschmidt C Renay Crammer  IMPRESSIONS   1. Left ventricular ejection fraction, by estimation, is 55 to 60%. The left ventricle has normal function. The left ventricle has no regional wall motion abnormalities. There is mild left ventricular hypertrophy. Left ventricular diastolic parameters are indeterminate. The average left ventricular global longitudinal strain is -16.0 %. 2. Right ventricular systolic function is normal. The right ventricular size is normal. There is moderately elevated pulmonary artery systolic pressure. 3. Possible PFO/ASD Qp/QS 0.5. 4. The mitral valve is grossly normal. Trivial mitral valve regurgitation. 5. S/p bioprosthetic AVR with known restricted R/L cusps. eccentric AI. PHT 198 ms. Has holodiastolic flow reversal. Aortic valve regurgitation is moderate to severe. Moderate aortic valve stenosis. Procedure Date: 12/2005. 6. The inferior vena cava is dilated in size with <50% respiratory variability, suggesting right atrial pressure of 15 mmHg.  Conclusion(s)/Recommendation(s): Compared to prior study, AI has progressed. Based on the above  findings, recommend TEE.  FINDINGS Left Ventricle: Left ventricular ejection fraction, by estimation, is 55 to 60%. The left ventricle has normal function. The left ventricle has no regional wall motion abnormalities. The average left ventricular global longitudinal strain is -16.0 %. The left ventricular internal cavity size was normal in size. There is mild left ventricular hypertrophy. Left ventricular diastolic parameters are indeterminate. The ratio of pulmonic flow to systemic flow (Qp/Qs ratio) is 0.50.  Right Ventricle: The right ventricular size is normal. Right ventricular systolic function is normal. There is moderately elevated pulmonary  artery systolic pressure. The tricuspid regurgitant velocity is 2.82 m/s, and with an assumed right atrial pressure of 15 mmHg, the estimated right ventricular systolic pressure is 46.8 mmHg.  Left Atrium: Left atrial size was normal in size.  Right Atrium: Right atrial size was normal in size.  Pericardium: There is no evidence of pericardial effusion.  Mitral Valve: The mitral valve is grossly normal. Trivial mitral valve regurgitation.  Tricuspid Valve: The tricuspid valve is normal in structure. Tricuspid valve regurgitation is mild.  Aortic Valve: S/p bioprosthetic AVR with known restricted R/L cusps. eccentric AI. PHT 198 ms. Has holodiastolic flow reversal. Aortic valve regurgitation is moderate to severe. Aortic regurgitation PHT measures 226 msec. Moderate aortic stenosis is present. Aortic valve mean gradient measures 27.0 mmHg. Aortic valve peak gradient measures 44.1 mmHg. Aortic valve area, by VTI measures 1.22 cm. There is a 27 mm Medtronic bioprosthetic valve present in the aortic position.  Pulmonic Valve: Pulmonic valve regurgitation is trivial.  Aorta: The aortic root and ascending aorta are structurally normal, with no evidence of dilitation.  Venous: The inferior vena cava is dilated in size with less than 50% respiratory variability, suggesting right atrial pressure of 15 mmHg.  IAS/Shunts: The ratio of pulmonic flow to systemic flow (Qp/Qs ratio) is 0.50.   LEFT VENTRICLE PLAX 2D LVIDd:         5.40 cm   Diastology LVIDs:         3.70 cm   LV e' medial:    12.60 cm/s LV PW:         1.40 cm   LV E/e' medial:  3.4 LV IVS:        1.20 cm   LV e' lateral:   12.70 cm/s LVOT diam:     2.00 cm   LV E/e' lateral: 3.3 LV SV:         81 LV SV Index:   40        2D Longitudinal Strain LVOT Area:     3.14 cm  2D Strain GLS (A2C):   -17.4 % 2D Strain GLS (A3C):   -14.0 % 2D Strain GLS (A4C):   -16.7 % 2D Strain GLS Avg:     -16.0 %  RIGHT VENTRICLE RV Basal diam:   4.10 cm RV S prime:     8.59 cm/s RVOT diam:      2.40 cm TAPSE (M-mode): 1.8 cm RVSP:           34.8 mmHg  LEFT ATRIUM              Index        RIGHT ATRIUM           Index LA diam:        4.20 cm  2.10 cm/m   RA Pressure: 3.00 mmHg LA Vol (A2C):   112.0 ml 55.97 ml/m  RA Area:     19.40  cm LA Vol (A4C):   67.5 ml  33.73 ml/m  RA Volume:   56.70 ml  28.33 ml/m LA Biplane Vol: 86.8 ml  43.38 ml/m AORTIC VALVE                     PULMONIC VALVE AV Area (Vmax):    1.09 cm      RVOT Peak grad: 1 mmHg AV Area (Vmean):   1.10 cm AV Area (VTI):     1.22 cm AV Vmax:           332.00 cm/s AV Vmean:          240.000 cm/s AV VTI:            0.664 m AV Peak Grad:      44.1 mmHg AV Mean Grad:      27.0 mmHg LVOT Vmax:         115.00 cm/s LVOT Vmean:        84.000 cm/s LVOT VTI:          0.257 m LVOT/AV VTI ratio: 0.39 AI PHT:            226 msec  AORTA Ao Root diam: 3.50 cm Ao Asc diam:  2.40 cm  MITRAL VALVE                TRICUSPID VALVE MV Area (PHT):              TR Peak grad:   31.8 mmHg MV Decel Time:              TR Vmax:        282.00 cm/s MV E velocity: 42.40 cm/s   Estimated RAP:  3.00 mmHg MV A velocity: 107.00 cm/s  RVSP:           34.8 mmHg MV E/A ratio:  0.40 SHUNTS Systemic VTI:  0.26 m Systemic Diam: 2.00 cm Pulmonic VTI:  0.098 m Pulmonic Diam: 2.40 cm Qp/Qs:         0.55  Carolan Clines Electronically signed by Carolan Clines Signature Date/Time: 06/05/2023/11:17:43 AM    Final     CT SCANS  CT CORONARY MORPH W/CTA COR W/SCORE 05/22/2023  Addendum 06/02/2023 10:16 AM ADDENDUM REPORT: 06/02/2023 10:13  EXAM: OVER-READ INTERPRETATION CT CHEST  The following report is an over-read performed by radiologist Dr. Lesia Hausen [facility] on 06/02/2023. This over-read does not include interpretation of cardiac or coronary anatomy or pathology. The coronary CTA interpretation by the cardiologist is attached.  COMPARISON:  CT chest dated  05/09/2023  FINDINGS: Cardiovascular: Vascular calcifications are seen in the thoracic aorta. The heart is enlarged.  Mediastinum/Nodes: No enlarged mediastinal lymph nodes. The visible trachea and esophagus demonstrate no significant findings.  Lungs/Pleura: There is a moderate left and a small right pleural effusion with associated atelectasis. Emphysematous changes are noted.  Upper Abdomen: No acute abnormality.  Musculoskeletal: No chest wall mass or suspicious bone lesions identified.  IMPRESSION: Moderate left and small right pleural effusions with associated atelectasis.  Aortic Atherosclerosis (ICD10-I70.0) and Emphysema (ICD10-J43.9).   Electronically Signed By: Romona Curls M.D. On: 06/02/2023 10:13  Narrative CLINICAL DATA:  52 Year-old Male  EXAM: Cardiac/Coronary  CTA  TECHNIQUE: The patient was scanned on a [employer].  FINDINGS: Scan was triggered in the descending thoracic aorta. Axial non-contrast 3 mm slices were carried out through the heart. The data set was analyzed on a dedicated work station and  scored using the Agatson method. Gantry rotation speed was 250 msecs and collimation was .6 mm. 0.8 mg of sl NTG was given. The 3D data set was reconstructed in 5% intervals of the 67-82 % of the R-R cycle. Diastolic phases were analyzed on a dedicated work station using MPR, MIP and VRT modes. The patient received 95 cc of contrast.  Coronary Arteries:  Normal coronary origin. Right dominance.  Coronary Calcium Score:  Left main: 0  Left anterior descending artery: 0  Left circumflex artery: 0  Right coronary artery: 0  Total: 0  Percentile: 1st for age, sex, and race matched control.  Plaque Analysis:  Left main: 51 cubic mm non calcified plaque- calcified plaque picked up by imaging is related to reimplantation.  Right coronary artery: 20 cubic mm non calcified plaque- calcified plaque picked up by imaging is related  to reimplantation.  Total: 71  RCA is a large dominant artery that gives rise to PDA and PLA. There is no significant plaque.  Left main is a large artery that gives rise to LAD and LCX arteries. There is no significant plaque.  LAD is a large vessel that gives rise to one two diagonals. There is no significant plaque.  LCX is a non-dominant artery that gives rise to one large OM1 branch. There is no significant plaque.  Other findings:  Aorta: Prior thoracic aortic resection graft with Medtronic Freestyle Valve. Mild dilation of aortic root, 41 mm by sinus to sinus dimension. Aortic atherosclerosis. No dissection.  Main Pulmonary Artery: Not well visualized in this acquisition.  Systemic Veins: Normal drainage  Aortic Valve: Post 27 mm Medtronic Freestyle Valve. There is Grade 1 hypoattenuation leaflet thickening of the right leaflet. This study was not optimized for valve assessment.  Mitral valve: No calcification or stenosis.  There is abnormal pulmonary vein drainage: Partial anomalous pulmonary venous return- the right upper pulmonary venous return into the posterior-inferior portion of the superior vena cava.  Normal left atrial appendage without a thrombus. Chicken wing morphology.  Interatrial septum notable for left to right shunt concerning for an ASD.  Left Ventricle: Normal size  Left Atrium: Normal size  Right Ventricle: Dilated.  RV basal dimension 54 mm.  Right Atrium: Dilated.  Pericardium: Normal thickness  Extra-cardiac findings: See attached radiology report for non-cardiac structures.  IMPRESSION: 1. Coronary calcium score of 0. This was 1st percentile for age, sex, and race matched control. Total plaque volume of 71, this is greater than the 10th percentile for age and gender.  2. Normal coronary origin with right dominance.  3. Prior thoracic aortic root graft with coronary implantation and 27 mm Medtronic Freestyle valve. Mild  HALT. Mild root dilation. Repeat imaging in one year is recommended.  4. 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 Discussed with primary cardiologist.  5. CAD-RADS 0. No evidence of CAD (0%). Consider non-atherosclerotic causes of chest pain.  RECOMMENDATIONS: RECOMMENDATIONS The proposed cut-off value of 1,651 AU yielded a 93 % sensitivity and 75 % specificity in grading AS severity in patients with classical low-flow, low-gradient AS. Proposed different cut-off values to define severe AS for men and women as 2,065 AU and 1,274 AU, respectively. The joint European and American recommendations for the assessment of AS consider the aortic valve calcium score as a continuum - a very high calcium score suggests severe AS and a low calcium score suggests severe AS  is unlikely.  Sunday Shams, et al. 2017 ESC/EACTS Guidelines for the management of valvular heart disease. Eur Heart J 2017;38:2739-91.  Coronary artery calcium (CAC) score is a strong predictor of incident coronary heart disease (CHD) and provides predictive information beyond traditional risk factors. CAC scoring is reasonable to use in the decision to withhold, postpone, or initiate statin therapy in intermediate-risk or selected borderline-risk asymptomatic adults (age 56-75 years and LDL-C >=70 to <190 mg/dL) who do not have diabetes or established atherosclerotic cardiovascular disease (ASCVD).* In intermediate-risk (10-year ASCVD risk >=7.5% to <20%) adults or selected borderline-risk (10-year ASCVD risk >=5% to <7.5%) adults in whom a CAC score is measured for the purpose of making a treatment decision the following recommendations have been made:  If CAC = 0, it is reasonable to withhold statin therapy and reassess in 5 to 10 years, as long as higher risk conditions are absent (diabetes  mellitus, family history of premature CHD in first degree relatives (males <55 years; females <65 years), cigarette smoking, LDL >=190 mg/dL or other independent risk factors).  If CAC is 1 to 99, it is reasonable to initiate statin therapy for patients >=103 years of age.  If CAC is >=100 or >=75th percentile, it is reasonable to initiate statin therapy at any age.  Cardiology referral should be considered for patients with CAC scores =400 or >=75th percentile.  *2018 AHA/ACC/AACVPR/AAPA/ABC/ACPM/ADA/AGS/APhA/ASPC/NLA/PCNA Guideline on the Management of Blood Cholesterol: A Report of the American College of Cardiology/American Heart Association Task Force on Clinical Practice Guidelines. J Am Coll Cardiol. 2019;73(24):3168-3209.  Riley Lam, MD  Electronically Signed: By: Riley Lam M.D. 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   Cardiac catheterization in 2007 revealed normal coronary arteries, normal LV function.  He underwent thoracic aortic aneurysm resection and grafting with aortic valve replacement, coronary artery reimplantation by Dr. [name] in April 2007.  Most recent echocardiogram in November 2023 showed EF 60 to 65%, normal LV function, no RWMA, normal RV, mild aortic stenosis, mean gradient 18.8 mmHg, with stable  bioprosthetic valve, mean gradient slightly increased from prior study, mild dilation of the aortic root measuring 39 mm.  Repeat echocardiogram was recommended in 1 year.  He presented to the ED on 05/09/2023 in the setting of chest pain, shortness of breath.  Troponin was slightly elevated, BNP was elevated.  CTA chest was negative for dissection or aneurysmal dilation, there was evidence of mild coronary calcifications as well as small bilateral pleural effusions.  He was started on losartan-hydrochlorothiazide, and advised to follow-up with cardiology as an outpatient. He was last seen in the office on 05/14/2023 and noted ongoing shortness of breath with associated chest tightness, orthopnea.  Coronary CT angiogram revealed coronary calcium score of 0, no evidence of CAD, mild dilation of  the aortic root measuring 41 mm. Repeat echocardiogram showed EF 55 to 60%, normal function, no RWMA, mild LVH, normal RV, moderately elevated PASP, moderate to severe aortic valve regurgitation, moderate aortic stenosis s/p bioprosthetic AVR.  Follow-up TEE was recommended given progression of AI.  He presents today for follow-up accompanied by his wife. Since his last visit he has been stable from a cardiac standpoint.  He continues to note intermittent shortness of breath, orthopnea, denies edema, PND, denies chest pain.  He notes a persistent cough in the mornings, along with some postnasal drip, this has been ongoing but he feels that his cough in the mornings has gotten worse.  He continues to smoke both cigarettes and marijuana.   Home Medications    Current Outpatient Medications  Medication Sig Dispense Refill   atorvastatin (LIPITOR) 20 MG tablet TAKE 1 TABLET (20 MG TOTAL) BY MOUTH DAILY. KEEP OFFICE VISIT FOR FUTURE REFILLS. 90 tablet 3   clindamycin (CLEOCIN) 300 MG capsule Take 2 tablets 1 hour prior to dental appointment 2 capsule 3   furosemide (LASIX) 20 MG tablet Take 1 tablet (20 mg total) by mouth  daily. 90 tablet 3   tadalafil (CIALIS) 20 MG tablet Take 1 tablet (20 mg total) by mouth daily. 10 tablet 1   fluticasone (FLONASE) 50 MCG/ACT nasal spray Place 1 spray into both nostrils daily as needed for rhinitis.     levocetirizine (XYZAL) 5 MG tablet Take 5 mg by mouth every evening.     Multiple Vitamins-Minerals (MULTIVITAMIN WITH MINERALS) tablet Take 1 tablet by mouth daily.     No current facility-administered medications for this visit.     Review of Systems    He denies chest pain, palpitations, pnd, n, v, dizziness, syncope, edema, weight gain, or early satiety. All other systems reviewed and are otherwise negative except as noted above.   Physical Exam    VS:  BP (!) 130/50 (BP Location: Left Arm, Patient Position: Sitting, Cuff Size: Normal)   Pulse 71   Ht 6\' 6"  (1.981 m)   Wt 155 lb (70.3 kg)   SpO2 98%   BMI 17.91 kg/m   GEN: Well nourished, well developed, in no acute distress. HEENT: normal. Neck: Supple, no JVD, carotid bruits, or masses. Cardiac: RRR, 2/6 murmur, no rubs, or gallops. No clubbing, cyanosis, edema.  Radials/DP/PT 2+ and equal bilaterally.  Respiratory:  Respirations regular and unlabored, clear to auscultation bilaterally. GI: Soft, nontender, nondistended, BS + x 4. MS: no deformity or atrophy. Skin: warm and dry, no rash. Neuro:  Strength and sensation are intact. Psych: Normal affect.  Accessory Clinical Findings    ECG personally reviewed by me today - EKG Interpretation Date/Time:  Monday June 23 2023 10:18:10 EDT Ventricular Rate:  71 PR Interval:  146 QRS Duration:  86 QT Interval:  406 QTC Calculation: 441 R Axis:   49  Text Interpretation: Normal sinus rhythm Possible Left atrial enlargement Left ventricular hypertrophy ( Sokolow-Lyon , Cornell product ) Nonspecific T wave abnormality Confirmed by Bernadene Person ([id number]) on 06/23/2023 10:24:37 AM  - no acute changes.   Lab Results  Component Value Date   WBC 11.5 (H)  05/09/2023   HGB 12.9 (L) 05/09/2023   HCT 39.7 05/09/2023   MCV 101.0 (H) 05/09/2023   PLT 127 (L) 05/09/2023   Lab Results  Component Value Date   CREATININE 1.21 05/09/2023   BUN 18 05/09/2023   NA 141 05/09/2023   K 4.4 05/09/2023  CL 112 (H) 05/09/2023   CO2 24 05/09/2023   Lab Results  Component Value Date   ALT 26 05/09/2023   AST 22 05/09/2023   ALKPHOS 56 05/09/2023   BILITOT 1.2 05/09/2023   Lab Results  Component Value Date   CHOL 150 01/16/2023   HDL 49 01/16/2023   LDLCALC 86 01/16/2023   TRIG 68 01/16/2023   CHOLHDL 3.1 01/16/2023    Lab Results  Component Value Date   HGBA1C 5.7 (H) 01/16/2023    Assessment & Plan    1. Thoracic aortic aneurysm/AI/Aortic stenosis/shortness of breath/Marfan syndrome:  S/p thoracic aortic aneurysm resection and grafting with aortic valve replacement in 2007. Recent episode of chest discomfort that prompted ED visit. BNP and troponin were elevated.  CTA chest was negative for dissection or aneurysmal dilation, there was evidence of mild coronary calcifications as well as small bilateral pleural effusions. Coronary CT angiogram revealed coronary calcium score of 0, no evidence of CAD, mild dilation of the aortic root measuring 41 mm, as well as evidence of emphysema, small bilateral pleural effusions. Repeat echocardiogram showed EF 55 to 60%, normal function, no RWMA, mild LVH, normal RV, moderately elevated PASP, moderate to severe aortic valve regurgitation, moderate aortic stenosis s/p bioprosthetic AVR. Follow-up TEE was recommended given progression of AI.  We discussed this recommendation at today's visit, he is agreeable to proceed.  Will check CBC, BMET, BNP today.  Will stop losartan-HCTZ (he has not been taking this for the past month).   Will start Lasix 20 mg daily. Will repeat BMET in 1-2 weeks. Continue to monitor symptoms. TEE has been scheduled for 06/25/2023 with Dr. Duke Salvia.   Informed Consent   Shared Decision  Making/Informed Consent The risks [esophageal damage, perforation (1:10,000 risk), bleeding, pharyngeal hematoma as well as other potential complications associated with conscious sedation including aspiration, arrhythmia, respiratory failure and death], benefits (treatment guidance and diagnostic support) and alternatives of a transesophageal echocardiogram were discussed in detail with Christopher Ball and he is willing to proceed.     2. Hyperlipidemia: LDL was 86 in 01/2023. Continue Lipitor.    3. Tobacco use: He continues to smoke.  Full cessation advised.   4. Disposition: Follow-up in 1 month.       Joylene Grapes, NP 06/23/2023, 12:15 PM

## 2023-06-24 LAB — CBC
Hematocrit: 41 % (ref 37.5–51.0)
Hemoglobin: 13.2 g/dL (ref 13.0–17.7)
MCH: 32.5 pg (ref 26.6–33.0)
MCHC: 32.2 g/dL (ref 31.5–35.7)
MCV: 101 fL — ABNORMAL HIGH (ref 79–97)
Platelets: 187 10*3/uL (ref 150–450)
RBC: 4.06 x10E6/uL — ABNORMAL LOW (ref 4.14–5.80)
RDW: 11.9 % (ref 11.6–15.4)
WBC: 9.4 10*3/uL (ref 3.4–10.8)

## 2023-06-24 LAB — BASIC METABOLIC PANEL
BUN/Creatinine Ratio: 20 (ref 9–20)
BUN: 29 mg/dL — ABNORMAL HIGH (ref 6–24)
CO2: 23 mmol/L (ref 20–29)
Calcium: 9.4 mg/dL (ref 8.7–10.2)
Chloride: 111 mmol/L — ABNORMAL HIGH (ref 96–106)
Creatinine, Ser: 1.42 mg/dL — ABNORMAL HIGH (ref 0.76–1.27)
Glucose: 92 mg/dL (ref 70–99)
Potassium: 5.1 mmol/L (ref 3.5–5.2)
Sodium: 146 mmol/L — ABNORMAL HIGH (ref 134–144)
eGFR: 60 mL/min/{1.73_m2} (ref >59)

## 2023-06-24 LAB — BRAIN NATRIURETIC PEPTIDE: BNP: 1631.9 pg/mL — ABNORMAL HIGH (ref 0.0–100.0)

## 2023-06-24 NOTE — Progress Notes (Signed)
Spoke to patient on phone regarding procedure tomorrow.  Instructed to arrive at 11 am, NPO after midnight.  Confirmed patient has a ride home and responsible person to stay with patient for 24 hours after the procedure

## 2023-06-24 NOTE — Anesthesia Preprocedure Evaluation (Signed)
Anesthesia Evaluation  Patient identified by MRN, date of birth, ID band Patient awake    Reviewed: Allergy & Precautions, NPO status , Patient's Chart, lab work & pertinent test results  History of Anesthesia Complications (+) AWARENESS UNDER ANESTHESIA and history of anesthetic complications  Airway Mallampati: I  TM Distance: >3 FB Neck ROM: Full    Dental  (+) Dental Advisory Given   Pulmonary neg shortness of breath, neg sleep apnea, COPD (does not use inhalers), neg recent URI, Current Smoker and Patient abstained from smoking.   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 Left ventricular ejection fraction, by estimation, is 55 to 60%. The  left ventricle has normal function. The left ventricle has no regional  wall motion abnormalities. There is mild left ventricular hypertrophy.  Left ventricular diastolic parameters  are indeterminate. The average left ventricular global longitudinal strain  is -16.0 %.   2. Right ventricular systolic function is normal. The right ventricular  size is normal. There is moderately elevated pulmonary artery systolic  pressure.   3. Possible PFO/ASD Qp/QS 0.5.   4. The mitral valve is grossly normal. Trivial mitral valve  regurgitation.   5. S/p bioprosthetic AVR with known restricted R/L cusps. eccentric AI.  PHT 198 ms. Has holodiastolic flow reversal. Aortic valve regurgitation is  moderate to severe. Moderate aortic valve stenosis. Procedure Date:  12/2005.   6. The inferior vena cava is dilated in size with <50% respiratory  variability,  suggesting right atrial pressure of 15 mmHg.   Conclusion(s)/Recommendation(s): Compared to prior study, AI has  progressed. Based on the above findings, recommend TEE.     Neuro/Psych neg Seizures negative neurological ROS     GI/Hepatic negative GI ROS,,,(+)     substance abuse  marijuana use  Endo/Other  negative endocrine ROS    Renal/GU negative Renal ROS     Musculoskeletal   Abdominal   Peds  Hematology negative hematology ROS (+)   Anesthesia Other Findings Marfan syndrome  Reproductive/Obstetrics                             Anesthesia Physical Anesthesia Plan  ASA: 4  Anesthesia Plan: MAC   Post-op Pain Management:    Induction: Intravenous  PONV Risk Score and Plan: 0 and Propofol infusion and Treatment may vary due to age or medical condition  Airway Management Planned: Natural Airway and Simple Face Mask  Additional Equipment:   Intra-op Plan:   Post-operative Plan:   Informed Consent: I have reviewed the patients History and Physical, chart, labs and discussed the procedure including the risks, benefits and alternatives for the proposed anesthesia with the patient or authorized representative who has indicated his/her understanding and acceptance.     Dental advisory given  Plan Discussed with: Anesthesiologist and CRNA  Anesthesia Plan Comments: (Discussed with patient risks of MAC including, but not limited to, minor pain or discomfort, hearing people in the room, and possible need for backup general anesthesia. Risks for general anesthesia also discussed including, but not limited to, sore throat, hoarse voice, chipped/damaged teeth, injury to vocal cords, nausea and vomiting,  allergic reactions, lung infection, heart attack, stroke, and death. All questions answered. )        Anesthesia Quick Evaluation

## 2023-06-25 ENCOUNTER — Ambulatory Visit (HOSPITAL_COMMUNITY): Payer: Federal, State, Local not specified - PPO | Admitting: Anesthesiology

## 2023-06-25 ENCOUNTER — Encounter (HOSPITAL_COMMUNITY)
Admission: RE | Disposition: A | Discharge status: Home / Self Care | Payer: Self-pay | Source: Ambulatory Visit | Attending: Cardiovascular Disease

## 2023-06-25 ENCOUNTER — Ambulatory Visit (HOSPITAL_COMMUNITY): Payer: Federal, State, Local not specified - PPO

## 2023-06-25 ENCOUNTER — Encounter (HOSPITAL_COMMUNITY): Payer: Self-pay | Admitting: Cardiovascular Disease

## 2023-06-25 ENCOUNTER — Telehealth: Payer: Self-pay

## 2023-06-25 ENCOUNTER — Other Ambulatory Visit: Payer: Self-pay

## 2023-06-25 ENCOUNTER — Ambulatory Visit (HOSPITAL_COMMUNITY)
Admission: RE | Admit: 2023-06-25 | Discharge: 2023-06-25 | Disposition: A | Discharge status: Home / Self Care | Payer: Federal, State, Local not specified - PPO | Source: Ambulatory Visit | Attending: Cardiovascular Disease | Admitting: Cardiovascular Disease

## 2023-06-25 DIAGNOSIS — I351 Nonrheumatic aortic (valve) insufficiency: Secondary | ICD-10-CM | POA: Diagnosis not present

## 2023-06-25 DIAGNOSIS — Q2112 Patent foramen ovale: Secondary | ICD-10-CM | POA: Insufficient documentation

## 2023-06-25 DIAGNOSIS — Z79899 Other long term (current) drug therapy: Secondary | ICD-10-CM | POA: Diagnosis not present

## 2023-06-25 DIAGNOSIS — E785 Hyperlipidemia, unspecified: Secondary | ICD-10-CM | POA: Diagnosis not present

## 2023-06-25 DIAGNOSIS — I352 Nonrheumatic aortic (valve) stenosis with insufficiency: Secondary | ICD-10-CM | POA: Insufficient documentation

## 2023-06-25 DIAGNOSIS — I712 Thoracic aortic aneurysm, without rupture, unspecified: Secondary | ICD-10-CM | POA: Diagnosis not present

## 2023-06-25 DIAGNOSIS — I350 Nonrheumatic aortic (valve) stenosis: Secondary | ICD-10-CM

## 2023-06-25 DIAGNOSIS — I3139 Other pericardial effusion (noninflammatory): Secondary | ICD-10-CM

## 2023-06-25 DIAGNOSIS — Z953 Presence of xenogenic heart valve: Secondary | ICD-10-CM | POA: Diagnosis not present

## 2023-06-25 DIAGNOSIS — F17210 Nicotine dependence, cigarettes, uncomplicated: Secondary | ICD-10-CM | POA: Diagnosis not present

## 2023-06-25 DIAGNOSIS — F1290 Cannabis use, unspecified, uncomplicated: Secondary | ICD-10-CM | POA: Insufficient documentation

## 2023-06-25 DIAGNOSIS — Z952 Presence of prosthetic heart valve: Secondary | ICD-10-CM

## 2023-06-25 DIAGNOSIS — R0602 Shortness of breath: Secondary | ICD-10-CM

## 2023-06-25 HISTORY — PX: TEE WITHOUT CARDIOVERSION: SHX5443

## 2023-06-25 LAB — ECHO TEE
AR max vel: 1.48 cm2
AV Area VTI: 1.56 cm2
AV Area mean vel: 1.41 cm2
AV Mean grad: 32 mm[Hg]
AV Peak grad: 61.5 mm[Hg]
Ao pk vel: 3.92 m/s
P 1/2 time: 115 ms

## 2023-06-25 SURGERY — ECHOCARDIOGRAM, TRANSESOPHAGEAL
Anesthesia: Monitor Anesthesia Care

## 2023-06-25 MED ORDER — SODIUM CHLORIDE 0.9 % IV SOLN
INTRAVENOUS | Status: DC | PRN
Start: 2023-06-25 — End: 2023-06-25

## 2023-06-25 MED ORDER — PROPOFOL 10 MG/ML IV BOLUS
INTRAVENOUS | Status: DC | PRN
  Administered 2023-06-25: 30 mg via INTRAVENOUS
  Administered 2023-06-25: 75 ug/kg/min via INTRAVENOUS
  Administered 2023-06-25: 30 mg via INTRAVENOUS
  Administered 2023-06-25: 50 mg via INTRAVENOUS

## 2023-06-25 MED ORDER — SODIUM CHLORIDE 0.9 % IV SOLN: INTRAVENOUS | Status: DC

## 2023-06-25 NOTE — Transfer of Care (Signed)
Immediate Anesthesia Transfer of Care Note  Patient: Christopher Ball  Procedure(s) Performed: TRANSESOPHAGEAL ECHOCARDIOGRAM  Patient Location: PACU  Anesthesia Type:General  Level of Consciousness: awake, alert , and oriented  Airway & Oxygen Therapy: Patient Spontanous Breathing and Patient connected to face mask oxygen  Post-op Assessment: Report given to RN and Post -op Vital signs reviewed and stable  Post vital signs: Reviewed and stable  Last Vitals:  Vitals Value Taken Time  BP 169/38 06/25/23 1315  Temp 36.8 C 06/25/23 1315  Pulse 94 06/25/23 1315  Resp 18 06/25/23 1315  SpO2 96 % 06/25/23 1315  Vitals shown include unfiled device data.  Last Pain:  Vitals:   06/25/23 1315  TempSrc: Temporal  PainSc: 0-No pain         Complications: No notable events documented.

## 2023-06-25 NOTE — Anesthesia Postprocedure Evaluation (Signed)
Anesthesia Post Note  Patient: Christopher Ball  Procedure(s) Performed: TRANSESOPHAGEAL ECHOCARDIOGRAM     Patient location during evaluation: PACU Anesthesia Type: MAC Level of consciousness: awake Pain management: pain level controlled Vital Signs Assessment: post-procedure vital signs reviewed and stable Respiratory status: spontaneous breathing, nonlabored ventilation and respiratory function stable Cardiovascular status: stable and blood pressure returned to baseline Postop Assessment: no apparent nausea or vomiting Anesthetic complications: no   No notable events documented.  Last Vitals:  Vitals:   06/25/23 1340 06/25/23 1345  BP: (!) 166/80   Pulse: 76 76  Resp: 20 15  Temp:    SpO2: 96% 96%    Last Pain:  Vitals:   06/25/23 1345  TempSrc:   PainSc: 0-No pain                 Linton Rump

## 2023-06-25 NOTE — CV Procedure (Signed)
Brief TEE Note  LVEF 60-65% No LA/LAA thrombus or masses S/p bioprosthetic AVR and aortic root repair There is severe aortic regurgitation and moderate stenosis  Patient became hypoxic to the lower 70s during the study.  The TEE probe was removed and he was places on facemask O2.  Upon attempt to re-intubate we were unable to pass the GE junction so all transgastric images were not obtained.  Aortic valve gradients were obtained from transthoracic imaging.   For additional details see full report.  Trysta Showman C. Duke Salvia, MD, [facility] 06/25/2023 1:08 PM

## 2023-06-25 NOTE — Progress Notes (Signed)
  Echocardiogram Echocardiogram Transesophageal has been performed.  Delcie Roch 06/25/2023, 1:36 PM

## 2023-06-25 NOTE — Interval H&P Note (Signed)
History and Physical Interval Note:  06/25/2023 12:36 PM  Christopher Ball  has presented today for surgery, with the diagnosis of HX OF AORTIC VALVAE REPLACEMENT AORTIC STENOSIS AND REGERGITATION.  The various methods of treatment have been discussed with the patient and family. After consideration of risks, benefits and other options for treatment, the patient has consented to  Procedure(s): TRANSESOPHAGEAL ECHOCARDIOGRAM (N/A) as a surgical intervention.  The patient's history has been reviewed, patient examined, no change in status, stable for surgery.  I have reviewed the patient's chart and labs.  Questions were answered to the patient's satisfaction.     Chilton Si, MD

## 2023-06-25 NOTE — Telephone Encounter (Signed)
Spoke with pt. Pt was notified of lab results. Pt will increase Lasix 40 mg x 1 week and resume Lasix 20 mg daily. Pt doesn't need a new prescription at this time. Pt will contact our office if he has an increased shortness of breath, swelling, or weight gain of 3 lb overnight, 5 lb in a week. Pt will limit his salt intake to 2000 mg of sodium daily.

## 2023-06-25 NOTE — Discharge Instructions (Signed)
Transesophageal Echocardiogram Transesophageal echocardiogram (TEE) is a test that uses sound waves to take pictures of your heart. TEE is done by passing a small probe attached to a flexible tube down the part of the body that moves food from your mouth to your stomach (esophagus). The pictures give clear images of your heart. This can help your doctor see if there are problems with your heart. Tell a doctor about: Any allergies you have. All medicines you are taking. This includes vitamins, herbs, eye drops, creams, and over-the-counter medicines. Any problems you or family members have had with anesthetic medicines. Any blood disorders you have. Any surgeries you have had. Any medical conditions you have. Any swallowing problems. Whether you have or have had a blockage in the part of the body that moves food from your mouth to your stomach. Whether you are pregnant or may be pregnant. What are the risks? In general, this is a safe procedure. But, problems may occur, such as: Damage to nearby structures or organs. A tear in the part of the body that moves food from your mouth to your stomach. Irregular heartbeat. Hoarse voice or trouble swallowing. Bleeding. What happens before the procedure? Medicines Ask your doctor about changing or stopping: Your normal medicines. Vitamins, herbs, and supplements. Over-the-counter medicines. Do not take aspirin or ibuprofen unless you are told to. General instructions Follow instructions from your doctor about what you cannot eat or drink. You will take out any dentures or dental retainers. Plan to have a responsible adult take you home from the hospital or clinic. Plan to have a responsible adult care for you for the time you are told after you leave the hospital or clinic. This is important. What happens during the procedure?  An IV will be put into one of your veins. You may be given: A sedative. This medicine helps you relax. A medicine  to numb the back of your throat. This may be sprayed or gargled. Your blood pressure, heart rate, and breathing will be watched. You may be asked to lie on your left side. A bite block will be placed in your mouth. This keeps you from biting the tube. The tip of the probe will be placed into the back of your mouth. You will be asked to swallow. Your doctor will take pictures of your heart. The probe and bite block will be taken out after the test is done. The procedure may vary among doctors and hospitals. What can I expect after the procedure? You will be monitored until you leave the hospital or clinic. This includes checking your blood pressure, heart rate, breathing rate, and blood oxygen level. Your throat may feel sore and numb. This will get better over time. You will not be allowed to eat or drink until the numbness has gone away. It is common to have a sore throat for a day or two. It is up to you to get the results of your procedure. Ask how to get your results when they are ready. Follow these instructions at home: If you were given a sedative during your procedure, do not drive or use machines until your doctor says that it is safe. Return to your normal activities when your doctor says that it is safe. Keep all follow-up visits. Summary TEE is a test that uses sound waves to take pictures of your heart. You will be given a medicine to help you relax. Do not drive or use machines until your doctor says that it  is safe. This information is not intended to replace advice given to you by your health care provider. Make sure you discuss any questions you have with your health care provider. Document Revised: 05/16/2021 Document Reviewed: 04/25/2020 Elsevier Patient Education  2024 [employer].

## 2023-06-26 ENCOUNTER — Encounter (HOSPITAL_COMMUNITY): Payer: Self-pay | Admitting: Cardiovascular Disease

## 2023-07-01 ENCOUNTER — Ambulatory Visit: Payer: Federal, State, Local not specified - PPO | Attending: Cardiovascular Disease | Admitting: Cardiovascular Disease

## 2023-07-01 ENCOUNTER — Telehealth: Payer: Self-pay

## 2023-07-01 ENCOUNTER — Encounter: Payer: Self-pay | Admitting: Cardiovascular Disease

## 2023-07-01 VITALS — BP 160/40 | HR 83 | Ht 79.0 in | Wt 148.0 lb

## 2023-07-01 DIAGNOSIS — I351 Nonrheumatic aortic (valve) insufficiency: Secondary | ICD-10-CM | POA: Diagnosis not present

## 2023-07-01 DIAGNOSIS — Q8740 Marfan's syndrome, unspecified: Secondary | ICD-10-CM

## 2023-07-01 DIAGNOSIS — E782 Mixed hyperlipidemia: Secondary | ICD-10-CM | POA: Diagnosis not present

## 2023-07-01 MED ORDER — FUROSEMIDE 40 MG PO TABS: 40.0000 mg | ORAL_TABLET | Freq: Every day | ORAL | 3 refills | Status: DC

## 2023-07-01 NOTE — Telephone Encounter (Signed)
Structural heart evaluation currently scheduled on 10/25 with Dr Excell Seltzer.  Dr Allyson Sabal has requested that the patient under go evaluation as soon as possilbe due to SOB.  I attempted to reach the patient but no answer at this time. I will continue to attempt to reach the patient and will also send him a my chart message.

## 2023-07-01 NOTE — Assessment & Plan Note (Signed)
History of Marfan syndrome with aortic root dilatation status post thoracic aortic aneurysm resection and grafting by Dr. [name] 01/13/06 with aortic valve replacement using a proximal static aortic valve and coronary artery reimplantation.  He did have normal coronary arteries at that time.  Over the last 6 weeks he has noticed increasing shortness of breath coughing and orthopnea.  His BNP is elevated at 1631.  A transesophageal echo performed by Dr. Duke Salvia 06/25/2023 revealed moderate aortic stenosis with severe AI.  Interestingly, his echo performed 07/06/2022 revealed normal LV systolic function with a normally functioning aortic bioprosthesis.  I am concerned about SBE.  I am going to get 3 blood cultures to rule this out.

## 2023-07-01 NOTE — Assessment & Plan Note (Signed)
History of hyperlipidemia on statin therapy with lipid profile performed 01/16/2023 revealing a total cholesterol of 150, LDL of 86 and HDL 49.

## 2023-07-01 NOTE — Assessment & Plan Note (Signed)
Severe aortic insufficiency by recent TEE performed by Dr. Duke Salvia 06/25/2023.  He also had moderate aortic stenosis with preserved LV function.  Echo performed in November 2023 was essentially normal.  Unsure the etiology.  Concerned about possible SBE.

## 2023-07-01 NOTE — Patient Instructions (Signed)
Medication Instructions:  Your physician has recommended you make the following change in your medication:   -Continue taking furosemide (lasix) 40mg  once daily.  *If you need a refill on your cardiac medications before your next appointment, please call your pharmacy*   Lab Work: Your physician recommends that you have labs drawn today: 3 sets of blood cultures, Sed rate & CRP  [address]   If you have labs (blood work) drawn today and your tests are completely normal, you will receive your results only by: MyChart Message (if you have MyChart) OR A paper copy in the mail If you have any lab test that is abnormal or we need to change your treatment, we will call you to review the results.    Follow-Up: At [facility], you and your health needs are our priority.  As part of our continuing mission to provide you with exceptional heart care, we have created designated Provider Care Teams.  These Care Teams include your primary Cardiologist (physician) and Advanced Practice Providers (APPs -  Physician Assistants and Nurse Practitioners) who all work together to provide you with the care you need, when you need it.  We recommend signing up for the patient portal called "MyChart".  Sign up information is provided on this After Visit Summary.  MyChart is used to connect with patients for Virtual Visits (Telemedicine).  Patients are able to view lab/test results, encounter notes, upcoming appointments, etc.  Non-urgent messages can be sent to your provider as well.   To learn more about what you can do with MyChart, go to [email or web address].    Your next appointment:   6 week(s)  Provider:   Nanetta Batty, MD

## 2023-07-01 NOTE — Progress Notes (Signed)
07/01/2023 Christopher Ball   [birth year]  [id number]  Primary Physician Sampson Si Salvadore Oxford, NP Primary Cardiologist: [name] MD Milagros Loll, [place], [place]  HPI:  Christopher Ball is a 52 y.o.   tall and thin appearing married Caucasian male father of 2 children who does not work.  I last saw him in the office 08/14/2021.  He has a by his wife today.  He has a history of Marfan syndrome and underwent cardiac catheterization by myself 12/04/05 revealing normal coronary arteries and normal LV function. He ultimately underwent thoracic aortic aneurysm resection and grafting on 01/13/06 by Dr. [name] with aortic valve replacement using a bioprosthesis and coronary artery reimplantation. As of the problems include continued tobacco abuse 1 pack per day.    Since I saw him in the office 2 years ago he has had progressive shortness of breath and orthopnea over the last 6 weeks.  He saw Anice Paganini, FNP who ordered a 2D echo that showed severe aortic insufficiency and moderate aortic stenosis.  This was confirmed by transesophageal echo performed by Dr. Duke Salvia on 06/25/2023.  His BNP was elevated at 1631.  Placed on furosemide with some improvement in his symptoms.  He did have a coronary CTA revealing a coronary calcium score of 0 with evidence of CAD.   Current Meds  Medication Sig   atorvastatin (LIPITOR) 20 MG tablet TAKE 1 TABLET (20 MG TOTAL) BY MOUTH DAILY. KEEP OFFICE VISIT FOR FUTURE REFILLS.   clindamycin (CLEOCIN) 300 MG capsule Take 2 tablets 1 hour prior to dental appointment   fluticasone (FLONASE) 50 MCG/ACT nasal spray Place 1 spray into both nostrils daily as needed for rhinitis.   furosemide (LASIX) 20 MG tablet Take 1 tablet (20 mg total) by mouth daily. (Patient taking differently: Take 20 mg by mouth daily. Take 40 mg for 1 week as of 06/25/2023, then resume 20 mg daily)   levocetirizine (XYZAL) 5 MG tablet Take 5 mg by mouth every evening.   Multiple Vitamins-Minerals  (MULTIVITAMIN WITH MINERALS) tablet Take 1 tablet by mouth daily.   tadalafil (CIALIS) 20 MG tablet Take 1 tablet (20 mg total) by mouth daily.     Allergies  Allergen Reactions   Augmentin [Amoxicillin-Pot Clavulanate] Shortness Of Breath and Nausea And Vomiting    Social History   Socioeconomic History   Marital status: Married    Spouse name: Not on file   Number of children: Not on file   Years of education: Not on file   Highest education level: Not on file  Occupational History   Not on file  Tobacco Use   Smoking status: Every Day    Current packs/day: 1.00    Average packs/day: 1 pack/day for 31.0 years (31.0 ttl pk-yrs)    Types: Cigars, Cigarettes   Smokeless tobacco: Never  Vaping Use   Vaping status: Every Day  Substance and Sexual Activity   Alcohol use: Yes    Alcohol/week: 1.0 standard drink of alcohol    Types: 1 Shots of liquor per week    Comment: rare   Drug use: Yes    Types: Marijuana   Sexual activity: Yes  Other Topics Concern   Not on file  Social History Narrative   Not on file   Social Determinants of Health   Financial Resource Strain: Not on file  Food Insecurity: No Food Insecurity (05/15/2023)   Hunger Vital Sign    Worried About Running Out of Food  in the Last Year: Never true    Ran Out of Food in the Last Year: Never true  Transportation Needs: No Transportation Needs (05/15/2023)   PRAPARE - Administrator, Civil Service (Medical): No    Lack of Transportation (Non-Medical): No  Physical Activity: Not on file  Stress: Not on file  Social Connections: Not on file  Intimate Partner Violence: Not on file     Review of Systems: General: negative for chills, fever, night sweats or weight changes.  Cardiovascular: negative for chest pain, dyspnea on exertion, edema, orthopnea, palpitations, paroxysmal nocturnal dyspnea or shortness of breath Dermatological: negative for rash Respiratory: negative for cough or  wheezing Urologic: negative for hematuria Abdominal: negative for nausea, vomiting, diarrhea, bright red blood per rectum, melena, or hematemesis Neurologic: negative for visual changes, syncope, or dizziness All other systems reviewed and are otherwise negative except as noted above.    Blood pressure (!) 160/40, pulse 83, height 6\' 7"  (2.007 m), weight 148 lb (67.1 kg), SpO2 100%.  General appearance: alert and no distress Neck: no adenopathy, no JVD, supple, symmetrical, trachea midline, thyroid not enlarged, symmetric, no tenderness/mass/nodules, and bilateral carotid bruits Lungs: clear to auscultation bilaterally Heart: 2/6 consistent with aortic stenosis as well as a murmur of aortic insufficiency. Extremities: extremities normal, atraumatic, no cyanosis or edema Pulses: 2+ and symmetric Skin: Skin color, texture, turgor normal. No rashes or lesions Neurologic: Grossly normal  EKG not performed today      ASSESSMENT AND PLAN:   Marfan syndrome History of Marfan syndrome with aortic root dilatation status post thoracic aortic aneurysm resection and grafting by Dr. [name] 01/13/06 with aortic valve replacement using a proximal static aortic valve and coronary artery reimplantation.  He did have normal coronary arteries at that time.  Over the last 6 weeks he has noticed increasing shortness of breath coughing and orthopnea.  His BNP is elevated at 1631.  A transesophageal echo performed by Dr. Duke Salvia 06/25/2023 revealed moderate aortic stenosis with severe AI.  Interestingly, his echo performed 07/06/2022 revealed normal LV systolic function with a normally functioning aortic bioprosthesis.  I am concerned about SBE.  I am going to get 3 blood cultures to rule this out.  Hyperlipidemia History of hyperlipidemia on statin therapy with lipid profile performed 01/16/2023 revealing a total cholesterol of 150, LDL of 86 and HDL 49.  Severe aortic regurgitation Severe aortic  insufficiency by recent TEE performed by Dr. Duke Salvia 06/25/2023.  He also had moderate aortic stenosis with preserved LV function.  Echo performed in November 2023 was essentially normal.  Unsure the etiology.  Concerned about possible SBE.     [name] MD FACP,FACC,FAHA, [facility] 07/01/2023 10:40 AM

## 2023-07-02 LAB — SEDIMENTATION RATE: Sed Rate: 2 mm/h (ref 0–30)

## 2023-07-02 LAB — C-REACTIVE PROTEIN: CRP: 4 mg/L (ref 0–10)

## 2023-07-02 NOTE — Telephone Encounter (Signed)
I spoke with the patient and rescheduled structural heart evaluation with Dr Excell Seltzer to 07/07/2023.

## 2023-07-07 ENCOUNTER — Ambulatory Visit: Payer: Federal, State, Local not specified - PPO | Attending: Cardiovascular Disease | Admitting: Cardiovascular Disease

## 2023-07-07 ENCOUNTER — Encounter: Payer: Self-pay | Admitting: Cardiovascular Disease

## 2023-07-07 VITALS — BP 138/40 | HR 87 | Ht 78.5 in | Wt 147.4 lb

## 2023-07-07 DIAGNOSIS — T82897A Other specified complication of cardiac prosthetic devices, implants and grafts, initial encounter: Secondary | ICD-10-CM | POA: Diagnosis not present

## 2023-07-07 LAB — CULTURE, BLOOD (SINGLE)

## 2023-07-07 NOTE — H&P (View-Only) (Signed)
Height:       78.0 in Accession #:    [id number]       Weight:       150.6 lb Date of Birth:  [birth year]        BSA:          2.001 m Patient Age:    52 years         BP:           148/94 mmHg Patient Gender: M                HR:           72 bpm. Exam Location:  [address]  Procedure: 2D Echo, 3D Echo, Cardiac Doppler and Color Doppler  Indications:    R06.00 SOB  History:        Patient has prior history of Echocardiogram examinations, most recent 08/06/2022. Signs/Symptoms:Chest Pain; Risk Factors:HLD and Current Smoker. Marfan  syndrome.  Sonographer:    Clearence Ped RCS Referring Phys: [phone] EMILY C MONGE  IMPRESSIONS   1. Left ventricular ejection fraction, by estimation, is 55 to 60%. The left ventricle has normal function. The left ventricle has no regional wall motion abnormalities. There is mild left ventricular hypertrophy. Left ventricular diastolic parameters are indeterminate. The average left ventricular global longitudinal strain is -16.0 %. 2. Right ventricular systolic function is normal. The right ventricular size is normal. There is moderately elevated pulmonary artery systolic pressure. 3. Possible PFO/ASD Qp/QS 0.5. 4. The mitral valve is grossly normal. Trivial mitral valve regurgitation. 5. S/p bioprosthetic AVR with known restricted R/L cusps. eccentric AI. PHT 198 ms. Has holodiastolic flow reversal. Aortic valve regurgitation is moderate to severe. Moderate aortic valve stenosis. Procedure Date: 12/2005. 6. The inferior vena cava is dilated in size with <50% respiratory variability, suggesting right atrial pressure of 15 mmHg.  Conclusion(s)/Recommendation(s): Compared to prior study, AI has progressed. Based on the above findings, recommend TEE.  FINDINGS Left Ventricle: Left ventricular ejection fraction, by estimation, is 55 to 60%. The left ventricle has normal function. The left ventricle has no regional wall motion abnormalities. The average left ventricular global longitudinal strain is -16.0 %. The left ventricular internal cavity size was normal in size. There is mild left ventricular hypertrophy. Left ventricular diastolic parameters are indeterminate. The ratio of pulmonic flow to systemic flow (Qp/Qs ratio) is 0.50.  Right Ventricle: The right ventricular size is normal. Right ventricular systolic function is normal. There is moderately elevated pulmonary artery systolic pressure. The tricuspid regurgitant velocity is 2.82 m/s, and with an assumed right atrial pressure of 15 mmHg,  the estimated right ventricular systolic pressure is 46.8 mmHg.  Left Atrium: Left atrial size was normal in size.  Right Atrium: Right atrial size was normal in size.  Pericardium: There is no evidence of pericardial effusion.  Mitral Valve: The mitral valve is grossly normal. Trivial mitral valve regurgitation.  Tricuspid Valve: The tricuspid valve is normal in structure. Tricuspid valve regurgitation is mild.  Aortic Valve: S/p bioprosthetic AVR with known restricted R/L cusps. eccentric AI. PHT 198 ms. Has holodiastolic flow reversal. Aortic valve regurgitation is moderate to severe. Aortic regurgitation PHT measures 226 msec. Moderate aortic stenosis is present. Aortic valve mean gradient measures 27.0 mmHg. Aortic valve peak gradient measures 44.1 mmHg. Aortic valve area, by VTI measures 1.22 cm. There is a 27 mm Medtronic bioprosthetic valve present in the aortic position.  Pulmonic Valve: Pulmonic valve regurgitation is trivial.  Aorta: The  Cardiology Office Note:    Date:  07/07/2023   ID:  BRONC BUCKBEE, DOB 02/13/1971, MRN [id number]  PCP:  Lorre Munroe, NP   [facility] Providers Cardiologist:  Nanetta Batty, MD     Referring MD: Lorre Munroe, NP   Chief Complaint  Patient presents with   Shortness of Breath    History of Present Illness:    Christopher Ball is a 52 y.o. 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  In August of this year he developed acute onset of pain from his left arm across the chest into the right upper arm.  He went to the emergency room as he also had shortness of breath at the time.  All of his symptoms were fairly abrupt in onset.  He was found to have an elevated BNP.  A CTA of the chest, abdomen, and pelvis was performed and showed no evidence of dissection.  He remained short of breath and presented for cardiology follow-up after his ER visit.  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  A TEE demonstrated severe aortic regurgitation with an eccentric AI jet and a prolapsed leaflet.  The patient is here with his wife today.  He continues to feel poorly.  He complains of shortness of breath with low-level activity and orthopnea.  He denies chest discomfort.  He is lightheaded when he bends forward and then raises back up.  He also feels poorly when he exerts and complains of dizziness.  He has been coughing up brown sputum but this is improved since he started furosemide.  He states that all of his symptoms have improved a little  bit since starting furosemide but he continues to feel poorly overall.  The patient has regular dental care and has a few cavities but no other issues.  Current Medications: Current Meds  Medication Sig   atorvastatin (LIPITOR) 20 MG tablet TAKE 1 TABLET (20 MG TOTAL) BY MOUTH DAILY. KEEP OFFICE VISIT FOR FUTURE REFILLS.   clindamycin (CLEOCIN) 300 MG capsule Take 2 tablets 1 hour prior to dental appointment   fluticasone (FLONASE) 50 MCG/ACT nasal spray Place 1 spray into both nostrils daily as needed for rhinitis.   furosemide (LASIX) 40 MG tablet Take 1 tablet (40 mg total) by mouth daily.   levocetirizine (XYZAL) 5 MG tablet Take 5 mg by mouth every evening.   Multiple Vitamins-Minerals (MULTIVITAMIN WITH MINERALS) tablet Take 1 tablet by mouth daily.   tadalafil (CIALIS) 20 MG tablet Take 1 tablet (20 mg total) by mouth daily.     Allergies:   Augmentin [amoxicillin-pot clavulanate]   ROS:   Please see the history of present illness.    All other systems reviewed and are negative.  EKGs/Labs/Other Studies Reviewed:    The following studies were reviewed today: Cardiac Studies & Procedures     STRESS TESTS  NM MYOCAR MULTI W/SPECT W 04/29/2006   ECHOCARDIOGRAM  ECHOCARDIOGRAM COMPLETE 06/05/2023  Narrative ECHOCARDIOGRAM REPORT    Patient Name:   Christopher Ball [facility] Date of Exam: 06/05/2023 Medical Rec #:  [id number]  Cardiology Office Note:    Date:  07/07/2023   ID:  BRONC BUCKBEE, DOB 02/13/1971, MRN [id number]  PCP:  Lorre Munroe, NP   [facility] Providers Cardiologist:  Nanetta Batty, MD     Referring MD: Lorre Munroe, NP   Chief Complaint  Patient presents with   Shortness of Breath    History of Present Illness:    Christopher Ball is a 52 y.o. 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  In August of this year he developed acute onset of pain from his left arm across the chest into the right upper arm.  He went to the emergency room as he also had shortness of breath at the time.  All of his symptoms were fairly abrupt in onset.  He was found to have an elevated BNP.  A CTA of the chest, abdomen, and pelvis was performed and showed no evidence of dissection.  He remained short of breath and presented for cardiology follow-up after his ER visit.  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  A TEE demonstrated severe aortic regurgitation with an eccentric AI jet and a prolapsed leaflet.  The patient is here with his wife today.  He continues to feel poorly.  He complains of shortness of breath with low-level activity and orthopnea.  He denies chest discomfort.  He is lightheaded when he bends forward and then raises back up.  He also feels poorly when he exerts and complains of dizziness.  He has been coughing up brown sputum but this is improved since he started furosemide.  He states that all of his symptoms have improved a little  bit since starting furosemide but he continues to feel poorly overall.  The patient has regular dental care and has a few cavities but no other issues.  Current Medications: Current Meds  Medication Sig   atorvastatin (LIPITOR) 20 MG tablet TAKE 1 TABLET (20 MG TOTAL) BY MOUTH DAILY. KEEP OFFICE VISIT FOR FUTURE REFILLS.   clindamycin (CLEOCIN) 300 MG capsule Take 2 tablets 1 hour prior to dental appointment   fluticasone (FLONASE) 50 MCG/ACT nasal spray Place 1 spray into both nostrils daily as needed for rhinitis.   furosemide (LASIX) 40 MG tablet Take 1 tablet (40 mg total) by mouth daily.   levocetirizine (XYZAL) 5 MG tablet Take 5 mg by mouth every evening.   Multiple Vitamins-Minerals (MULTIVITAMIN WITH MINERALS) tablet Take 1 tablet by mouth daily.   tadalafil (CIALIS) 20 MG tablet Take 1 tablet (20 mg total) by mouth daily.     Allergies:   Augmentin [amoxicillin-pot clavulanate]   ROS:   Please see the history of present illness.    All other systems reviewed and are negative.  EKGs/Labs/Other Studies Reviewed:    The following studies were reviewed today: Cardiac Studies & Procedures     STRESS TESTS  NM MYOCAR MULTI W/SPECT W 04/29/2006   ECHOCARDIOGRAM  ECHOCARDIOGRAM COMPLETE 06/05/2023  Narrative ECHOCARDIOGRAM REPORT    Patient Name:   Christopher Ball [facility] Date of Exam: 06/05/2023 Medical Rec #:  [id number]  Cardiology Office Note:    Date:  07/07/2023   ID:  BRONC BUCKBEE, DOB 02/13/1971, MRN [id number]  PCP:  Lorre Munroe, NP   [facility] Providers Cardiologist:  Nanetta Batty, MD     Referring MD: Lorre Munroe, NP   Chief Complaint  Patient presents with   Shortness of Breath    History of Present Illness:    Christopher Ball is a 52 y.o. 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  In August of this year he developed acute onset of pain from his left arm across the chest into the right upper arm.  He went to the emergency room as he also had shortness of breath at the time.  All of his symptoms were fairly abrupt in onset.  He was found to have an elevated BNP.  A CTA of the chest, abdomen, and pelvis was performed and showed no evidence of dissection.  He remained short of breath and presented for cardiology follow-up after his ER visit.  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  A TEE demonstrated severe aortic regurgitation with an eccentric AI jet and a prolapsed leaflet.  The patient is here with his wife today.  He continues to feel poorly.  He complains of shortness of breath with low-level activity and orthopnea.  He denies chest discomfort.  He is lightheaded when he bends forward and then raises back up.  He also feels poorly when he exerts and complains of dizziness.  He has been coughing up brown sputum but this is improved since he started furosemide.  He states that all of his symptoms have improved a little  bit since starting furosemide but he continues to feel poorly overall.  The patient has regular dental care and has a few cavities but no other issues.  Current Medications: Current Meds  Medication Sig   atorvastatin (LIPITOR) 20 MG tablet TAKE 1 TABLET (20 MG TOTAL) BY MOUTH DAILY. KEEP OFFICE VISIT FOR FUTURE REFILLS.   clindamycin (CLEOCIN) 300 MG capsule Take 2 tablets 1 hour prior to dental appointment   fluticasone (FLONASE) 50 MCG/ACT nasal spray Place 1 spray into both nostrils daily as needed for rhinitis.   furosemide (LASIX) 40 MG tablet Take 1 tablet (40 mg total) by mouth daily.   levocetirizine (XYZAL) 5 MG tablet Take 5 mg by mouth every evening.   Multiple Vitamins-Minerals (MULTIVITAMIN WITH MINERALS) tablet Take 1 tablet by mouth daily.   tadalafil (CIALIS) 20 MG tablet Take 1 tablet (20 mg total) by mouth daily.     Allergies:   Augmentin [amoxicillin-pot clavulanate]   ROS:   Please see the history of present illness.    All other systems reviewed and are negative.  EKGs/Labs/Other Studies Reviewed:    The following studies were reviewed today: Cardiac Studies & Procedures     STRESS TESTS  NM MYOCAR MULTI W/SPECT W 04/29/2006   ECHOCARDIOGRAM  ECHOCARDIOGRAM COMPLETE 06/05/2023  Narrative ECHOCARDIOGRAM REPORT    Patient Name:   Christopher Ball [facility] Date of Exam: 06/05/2023 Medical Rec #:  [id number]  TR Vmax:        282.00 cm/s MV E velocity: 42.40 cm/s   Estimated RAP:  3.00 mmHg MV A velocity: 107.00 cm/s  RVSP:           34.8 mmHg MV E/A ratio:  0.40 SHUNTS Systemic VTI:  0.26 m Systemic Diam: 2.00 cm Pulmonic VTI:  0.098 m Pulmonic Diam: 2.40 cm Qp/Qs:         0.55  Carolan Clines Electronically signed by Carolan Clines Signature Date/Time: 06/05/2023/11:17:43 AM    Final   TEE  ECHO TEE 06/25/2023  Narrative TRANSESOPHOGEAL ECHO REPORT    Patient Name:   ADARION SPAZIANO [facility] Date of Exam: 06/25/2023 Medical Rec #:  [id number]        Height:       78.0 in Accession #:    [id number]       Weight:       155.0 lb Date of Birth:  20-Feb-1971        BSA:          2.026 m Patient Age:    52 years         BP:           112/36 mmHg Patient Gender: M                HR:           96 bpm. Exam Location:  Outpatient  Procedure: Transesophageal Echo, Cardiac Doppler and Color Doppler  Indications:     aortic valve regurgitation  History:         Patient has prior history of  Echocardiogram examinations, most recent 06/05/2023. CHF, Marfan's syndrome; Risk Factors:Current Smoker and Dyslipidemia.  Sonographer:     Delcie Roch RDCS Referring Phys:  [id number] Petra Kuba MONGE Diagnosing Phys: Chilton Si MD  PROCEDURE: After discussion of the risks and benefits of a TEE, an informed consent was obtained from the patient. The transesophogeal probe was passed without difficulty through the esophogus of the patient. Imaged were obtained with the patient in a left lateral decubitus position. Sedation performed by different physician. The patient was monitored while under deep sedation. Anesthestetic sedation was provided intravenously by Anesthesiology: 255mg  of Propofol. The patient's vital signs; including heart rate, blood pressure, and oxygen saturation; remained stable throughout the procedure. The patient developed no complications during the procedure.  IMPRESSIONS   1. Left ventricular ejection fraction, by estimation, is 60 to 65%. The left ventricle has normal function. The left ventricle has no regional wall motion abnormalities. 2. Right ventricular systolic function is normal. The right ventricular size is normal. 3. No left atrial/left atrial appendage thrombus was detected. 4. Moderate pericardial effusion. 5. The mitral valve is normal in structure. Trivial mitral valve regurgitation. No evidence of mitral stenosis. 6. The aortic valve has been repaired/replaced. Aortic valve regurgitation is severe. Moderate aortic valve stenosis. Aortic regurgitation PHT measures 115 msec. Aortic valve area, by VTI measures 1.56 cm. Aortic valve mean gradient measures 32.0 mmHg. Aortic valve Vmax measures 3.92 m/s. 7. Aortic root/ascending aorta has been repaired/replaced. 8. The inferior vena cava is normal in size with greater than 50% respiratory variability, suggesting right atrial pressure of 3 mmHg. 9. There is a moderately sized patent foramen ovale with  predominantly left to right shunting across the atrial septum.  FINDINGS Left Ventricle: Left ventricular ejection fraction, by estimation, is 60 to 65%. The left ventricle has normal function. The left ventricle has no regional wall  Cardiology Office Note:    Date:  07/07/2023   ID:  BRONC BUCKBEE, DOB 02/13/1971, MRN [id number]  PCP:  Lorre Munroe, NP   [facility] Providers Cardiologist:  Nanetta Batty, MD     Referring MD: Lorre Munroe, NP   Chief Complaint  Patient presents with   Shortness of Breath    History of Present Illness:    Christopher Ball is a 52 y.o. 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  In August of this year he developed acute onset of pain from his left arm across the chest into the right upper arm.  He went to the emergency room as he also had shortness of breath at the time.  All of his symptoms were fairly abrupt in onset.  He was found to have an elevated BNP.  A CTA of the chest, abdomen, and pelvis was performed and showed no evidence of dissection.  He remained short of breath and presented for cardiology follow-up after his ER visit.  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  A TEE demonstrated severe aortic regurgitation with an eccentric AI jet and a prolapsed leaflet.  The patient is here with his wife today.  He continues to feel poorly.  He complains of shortness of breath with low-level activity and orthopnea.  He denies chest discomfort.  He is lightheaded when he bends forward and then raises back up.  He also feels poorly when he exerts and complains of dizziness.  He has been coughing up brown sputum but this is improved since he started furosemide.  He states that all of his symptoms have improved a little  bit since starting furosemide but he continues to feel poorly overall.  The patient has regular dental care and has a few cavities but no other issues.  Current Medications: Current Meds  Medication Sig   atorvastatin (LIPITOR) 20 MG tablet TAKE 1 TABLET (20 MG TOTAL) BY MOUTH DAILY. KEEP OFFICE VISIT FOR FUTURE REFILLS.   clindamycin (CLEOCIN) 300 MG capsule Take 2 tablets 1 hour prior to dental appointment   fluticasone (FLONASE) 50 MCG/ACT nasal spray Place 1 spray into both nostrils daily as needed for rhinitis.   furosemide (LASIX) 40 MG tablet Take 1 tablet (40 mg total) by mouth daily.   levocetirizine (XYZAL) 5 MG tablet Take 5 mg by mouth every evening.   Multiple Vitamins-Minerals (MULTIVITAMIN WITH MINERALS) tablet Take 1 tablet by mouth daily.   tadalafil (CIALIS) 20 MG tablet Take 1 tablet (20 mg total) by mouth daily.     Allergies:   Augmentin [amoxicillin-pot clavulanate]   ROS:   Please see the history of present illness.    All other systems reviewed and are negative.  EKGs/Labs/Other Studies Reviewed:    The following studies were reviewed today: Cardiac Studies & Procedures     STRESS TESTS  NM MYOCAR MULTI W/SPECT W 04/29/2006   ECHOCARDIOGRAM  ECHOCARDIOGRAM COMPLETE 06/05/2023  Narrative ECHOCARDIOGRAM REPORT    Patient Name:   Christopher Ball [facility] Date of Exam: 06/05/2023 Medical Rec #:  [id number]  Cardiology Office Note:    Date:  07/07/2023   ID:  BRONC BUCKBEE, DOB 02/13/1971, MRN [id number]  PCP:  Lorre Munroe, NP   [facility] Providers Cardiologist:  Nanetta Batty, MD     Referring MD: Lorre Munroe, NP   Chief Complaint  Patient presents with   Shortness of Breath    History of Present Illness:    Christopher Ball is a 52 y.o. 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  In August of this year he developed acute onset of pain from his left arm across the chest into the right upper arm.  He went to the emergency room as he also had shortness of breath at the time.  All of his symptoms were fairly abrupt in onset.  He was found to have an elevated BNP.  A CTA of the chest, abdomen, and pelvis was performed and showed no evidence of dissection.  He remained short of breath and presented for cardiology follow-up after his ER visit.  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  A TEE demonstrated severe aortic regurgitation with an eccentric AI jet and a prolapsed leaflet.  The patient is here with his wife today.  He continues to feel poorly.  He complains of shortness of breath with low-level activity and orthopnea.  He denies chest discomfort.  He is lightheaded when he bends forward and then raises back up.  He also feels poorly when he exerts and complains of dizziness.  He has been coughing up brown sputum but this is improved since he started furosemide.  He states that all of his symptoms have improved a little  bit since starting furosemide but he continues to feel poorly overall.  The patient has regular dental care and has a few cavities but no other issues.  Current Medications: Current Meds  Medication Sig   atorvastatin (LIPITOR) 20 MG tablet TAKE 1 TABLET (20 MG TOTAL) BY MOUTH DAILY. KEEP OFFICE VISIT FOR FUTURE REFILLS.   clindamycin (CLEOCIN) 300 MG capsule Take 2 tablets 1 hour prior to dental appointment   fluticasone (FLONASE) 50 MCG/ACT nasal spray Place 1 spray into both nostrils daily as needed for rhinitis.   furosemide (LASIX) 40 MG tablet Take 1 tablet (40 mg total) by mouth daily.   levocetirizine (XYZAL) 5 MG tablet Take 5 mg by mouth every evening.   Multiple Vitamins-Minerals (MULTIVITAMIN WITH MINERALS) tablet Take 1 tablet by mouth daily.   tadalafil (CIALIS) 20 MG tablet Take 1 tablet (20 mg total) by mouth daily.     Allergies:   Augmentin [amoxicillin-pot clavulanate]   ROS:   Please see the history of present illness.    All other systems reviewed and are negative.  EKGs/Labs/Other Studies Reviewed:    The following studies were reviewed today: Cardiac Studies & Procedures     STRESS TESTS  NM MYOCAR MULTI W/SPECT W 04/29/2006   ECHOCARDIOGRAM  ECHOCARDIOGRAM COMPLETE 06/05/2023  Narrative ECHOCARDIOGRAM REPORT    Patient Name:   Christopher Ball [facility] Date of Exam: 06/05/2023 Medical Rec #:  [id number]  Cardiology Office Note:    Date:  07/07/2023   ID:  BRONC BUCKBEE, DOB 02/13/1971, MRN [id number]  PCP:  Lorre Munroe, NP   [facility] Providers Cardiologist:  Nanetta Batty, MD     Referring MD: Lorre Munroe, NP   Chief Complaint  Patient presents with   Shortness of Breath    History of Present Illness:    Christopher Ball is a 52 y.o. 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  In August of this year he developed acute onset of pain from his left arm across the chest into the right upper arm.  He went to the emergency room as he also had shortness of breath at the time.  All of his symptoms were fairly abrupt in onset.  He was found to have an elevated BNP.  A CTA of the chest, abdomen, and pelvis was performed and showed no evidence of dissection.  He remained short of breath and presented for cardiology follow-up after his ER visit.  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  A TEE demonstrated severe aortic regurgitation with an eccentric AI jet and a prolapsed leaflet.  The patient is here with his wife today.  He continues to feel poorly.  He complains of shortness of breath with low-level activity and orthopnea.  He denies chest discomfort.  He is lightheaded when he bends forward and then raises back up.  He also feels poorly when he exerts and complains of dizziness.  He has been coughing up brown sputum but this is improved since he started furosemide.  He states that all of his symptoms have improved a little  bit since starting furosemide but he continues to feel poorly overall.  The patient has regular dental care and has a few cavities but no other issues.  Current Medications: Current Meds  Medication Sig   atorvastatin (LIPITOR) 20 MG tablet TAKE 1 TABLET (20 MG TOTAL) BY MOUTH DAILY. KEEP OFFICE VISIT FOR FUTURE REFILLS.   clindamycin (CLEOCIN) 300 MG capsule Take 2 tablets 1 hour prior to dental appointment   fluticasone (FLONASE) 50 MCG/ACT nasal spray Place 1 spray into both nostrils daily as needed for rhinitis.   furosemide (LASIX) 40 MG tablet Take 1 tablet (40 mg total) by mouth daily.   levocetirizine (XYZAL) 5 MG tablet Take 5 mg by mouth every evening.   Multiple Vitamins-Minerals (MULTIVITAMIN WITH MINERALS) tablet Take 1 tablet by mouth daily.   tadalafil (CIALIS) 20 MG tablet Take 1 tablet (20 mg total) by mouth daily.     Allergies:   Augmentin [amoxicillin-pot clavulanate]   ROS:   Please see the history of present illness.    All other systems reviewed and are negative.  EKGs/Labs/Other Studies Reviewed:    The following studies were reviewed today: Cardiac Studies & Procedures     STRESS TESTS  NM MYOCAR MULTI W/SPECT W 04/29/2006   ECHOCARDIOGRAM  ECHOCARDIOGRAM COMPLETE 06/05/2023  Narrative ECHOCARDIOGRAM REPORT    Patient Name:   Christopher Ball [facility] Date of Exam: 06/05/2023 Medical Rec #:  [id number]

## 2023-07-07 NOTE — Progress Notes (Signed)
Cardiology Office Note:    Date:  07/07/2023   ID:  BRONC BUCKBEE, DOB 02/13/1971, MRN [id number]  PCP:  Lorre Munroe, NP   [facility] Providers Cardiologist:  Nanetta Batty, MD     Referring MD: Lorre Munroe, NP   Chief Complaint  Patient presents with   Shortness of Breath    History of Present Illness:    Christopher Ball is a 52 y.o. 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  In August of this year he developed acute onset of pain from his left arm across the chest into the right upper arm.  He went to the emergency room as he also had shortness of breath at the time.  All of his symptoms were fairly abrupt in onset.  He was found to have an elevated BNP.  A CTA of the chest, abdomen, and pelvis was performed and showed no evidence of dissection.  He remained short of breath and presented for cardiology follow-up after his ER visit.  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  A TEE demonstrated severe aortic regurgitation with an eccentric AI jet and a prolapsed leaflet.  The patient is here with his wife today.  He continues to feel poorly.  He complains of shortness of breath with low-level activity and orthopnea.  He denies chest discomfort.  He is lightheaded when he bends forward and then raises back up.  He also feels poorly when he exerts and complains of dizziness.  He has been coughing up brown sputum but this is improved since he started furosemide.  He states that all of his symptoms have improved a little  bit since starting furosemide but he continues to feel poorly overall.  The patient has regular dental care and has a few cavities but no other issues.  Current Medications: Current Meds  Medication Sig   atorvastatin (LIPITOR) 20 MG tablet TAKE 1 TABLET (20 MG TOTAL) BY MOUTH DAILY. KEEP OFFICE VISIT FOR FUTURE REFILLS.   clindamycin (CLEOCIN) 300 MG capsule Take 2 tablets 1 hour prior to dental appointment   fluticasone (FLONASE) 50 MCG/ACT nasal spray Place 1 spray into both nostrils daily as needed for rhinitis.   furosemide (LASIX) 40 MG tablet Take 1 tablet (40 mg total) by mouth daily.   levocetirizine (XYZAL) 5 MG tablet Take 5 mg by mouth every evening.   Multiple Vitamins-Minerals (MULTIVITAMIN WITH MINERALS) tablet Take 1 tablet by mouth daily.   tadalafil (CIALIS) 20 MG tablet Take 1 tablet (20 mg total) by mouth daily.     Allergies:   Augmentin [amoxicillin-pot clavulanate]   ROS:   Please see the history of present illness.    All other systems reviewed and are negative.  EKGs/Labs/Other Studies Reviewed:    The following studies were reviewed today: Cardiac Studies & Procedures     STRESS TESTS  NM MYOCAR MULTI W/SPECT W 04/29/2006   ECHOCARDIOGRAM  ECHOCARDIOGRAM COMPLETE 06/05/2023  Narrative ECHOCARDIOGRAM REPORT    Patient Name:   Christopher Ball [facility] Date of Exam: 06/05/2023 Medical Rec #:  [id number]  Height:       78.0 in Accession #:    [id number]       Weight:       150.6 lb Date of Birth:  [birth year]        BSA:          2.001 m Patient Age:    52 years         BP:           148/94 mmHg Patient Gender: M                HR:           72 bpm. Exam Location:  [address]  Procedure: 2D Echo, 3D Echo, Cardiac Doppler and Color Doppler  Indications:    R06.00 SOB  History:        Patient has prior history of Echocardiogram examinations, most recent 08/06/2022. Signs/Symptoms:Chest Pain; Risk Factors:HLD and Current Smoker. Marfan  syndrome.  Sonographer:    Clearence Ped RCS Referring Phys: [phone] EMILY C MONGE  IMPRESSIONS   1. Left ventricular ejection fraction, by estimation, is 55 to 60%. The left ventricle has normal function. The left ventricle has no regional wall motion abnormalities. There is mild left ventricular hypertrophy. Left ventricular diastolic parameters are indeterminate. The average left ventricular global longitudinal strain is -16.0 %. 2. Right ventricular systolic function is normal. The right ventricular size is normal. There is moderately elevated pulmonary artery systolic pressure. 3. Possible PFO/ASD Qp/QS 0.5. 4. The mitral valve is grossly normal. Trivial mitral valve regurgitation. 5. S/p bioprosthetic AVR with known restricted R/L cusps. eccentric AI. PHT 198 ms. Has holodiastolic flow reversal. Aortic valve regurgitation is moderate to severe. Moderate aortic valve stenosis. Procedure Date: 12/2005. 6. The inferior vena cava is dilated in size with <50% respiratory variability, suggesting right atrial pressure of 15 mmHg.  Conclusion(s)/Recommendation(s): Compared to prior study, AI has progressed. Based on the above findings, recommend TEE.  FINDINGS Left Ventricle: Left ventricular ejection fraction, by estimation, is 55 to 60%. The left ventricle has normal function. The left ventricle has no regional wall motion abnormalities. The average left ventricular global longitudinal strain is -16.0 %. The left ventricular internal cavity size was normal in size. There is mild left ventricular hypertrophy. Left ventricular diastolic parameters are indeterminate. The ratio of pulmonic flow to systemic flow (Qp/Qs ratio) is 0.50.  Right Ventricle: The right ventricular size is normal. Right ventricular systolic function is normal. There is moderately elevated pulmonary artery systolic pressure. The tricuspid regurgitant velocity is 2.82 m/s, and with an assumed right atrial pressure of 15 mmHg,  the estimated right ventricular systolic pressure is 46.8 mmHg.  Left Atrium: Left atrial size was normal in size.  Right Atrium: Right atrial size was normal in size.  Pericardium: There is no evidence of pericardial effusion.  Mitral Valve: The mitral valve is grossly normal. Trivial mitral valve regurgitation.  Tricuspid Valve: The tricuspid valve is normal in structure. Tricuspid valve regurgitation is mild.  Aortic Valve: S/p bioprosthetic AVR with known restricted R/L cusps. eccentric AI. PHT 198 ms. Has holodiastolic flow reversal. Aortic valve regurgitation is moderate to severe. Aortic regurgitation PHT measures 226 msec. Moderate aortic stenosis is present. Aortic valve mean gradient measures 27.0 mmHg. Aortic valve peak gradient measures 44.1 mmHg. Aortic valve area, by VTI measures 1.22 cm. There is a 27 mm Medtronic bioprosthetic valve present in the aortic position.  Pulmonic Valve: Pulmonic valve regurgitation is trivial.  Aorta: The  Cardiology Office Note:    Date:  07/07/2023   ID:  BRONC BUCKBEE, DOB 02/13/1971, MRN [id number]  PCP:  Lorre Munroe, NP   [facility] Providers Cardiologist:  Nanetta Batty, MD     Referring MD: Lorre Munroe, NP   Chief Complaint  Patient presents with   Shortness of Breath    History of Present Illness:    Christopher Ball is a 52 y.o. 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  In August of this year he developed acute onset of pain from his left arm across the chest into the right upper arm.  He went to the emergency room as he also had shortness of breath at the time.  All of his symptoms were fairly abrupt in onset.  He was found to have an elevated BNP.  A CTA of the chest, abdomen, and pelvis was performed and showed no evidence of dissection.  He remained short of breath and presented for cardiology follow-up after his ER visit.  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  A TEE demonstrated severe aortic regurgitation with an eccentric AI jet and a prolapsed leaflet.  The patient is here with his wife today.  He continues to feel poorly.  He complains of shortness of breath with low-level activity and orthopnea.  He denies chest discomfort.  He is lightheaded when he bends forward and then raises back up.  He also feels poorly when he exerts and complains of dizziness.  He has been coughing up brown sputum but this is improved since he started furosemide.  He states that all of his symptoms have improved a little  bit since starting furosemide but he continues to feel poorly overall.  The patient has regular dental care and has a few cavities but no other issues.  Current Medications: Current Meds  Medication Sig   atorvastatin (LIPITOR) 20 MG tablet TAKE 1 TABLET (20 MG TOTAL) BY MOUTH DAILY. KEEP OFFICE VISIT FOR FUTURE REFILLS.   clindamycin (CLEOCIN) 300 MG capsule Take 2 tablets 1 hour prior to dental appointment   fluticasone (FLONASE) 50 MCG/ACT nasal spray Place 1 spray into both nostrils daily as needed for rhinitis.   furosemide (LASIX) 40 MG tablet Take 1 tablet (40 mg total) by mouth daily.   levocetirizine (XYZAL) 5 MG tablet Take 5 mg by mouth every evening.   Multiple Vitamins-Minerals (MULTIVITAMIN WITH MINERALS) tablet Take 1 tablet by mouth daily.   tadalafil (CIALIS) 20 MG tablet Take 1 tablet (20 mg total) by mouth daily.     Allergies:   Augmentin [amoxicillin-pot clavulanate]   ROS:   Please see the history of present illness.    All other systems reviewed and are negative.  EKGs/Labs/Other Studies Reviewed:    The following studies were reviewed today: Cardiac Studies & Procedures     STRESS TESTS  NM MYOCAR MULTI W/SPECT W 04/29/2006   ECHOCARDIOGRAM  ECHOCARDIOGRAM COMPLETE 06/05/2023  Narrative ECHOCARDIOGRAM REPORT    Patient Name:   Christopher Ball [facility] Date of Exam: 06/05/2023 Medical Rec #:  [id number]  TR Vmax:        282.00 cm/s MV E velocity: 42.40 cm/s   Estimated RAP:  3.00 mmHg MV A velocity: 107.00 cm/s  RVSP:           34.8 mmHg MV E/A ratio:  0.40 SHUNTS Systemic VTI:  0.26 m Systemic Diam: 2.00 cm Pulmonic VTI:  0.098 m Pulmonic Diam: 2.40 cm Qp/Qs:         0.55  Carolan Clines Electronically signed by Carolan Clines Signature Date/Time: 06/05/2023/11:17:43 AM    Final   TEE  ECHO TEE 06/25/2023  Narrative TRANSESOPHOGEAL ECHO REPORT    Patient Name:   ADARION SPAZIANO [facility] Date of Exam: 06/25/2023 Medical Rec #:  [id number]        Height:       78.0 in Accession #:    [id number]       Weight:       155.0 lb Date of Birth:  20-Feb-1971        BSA:          2.026 m Patient Age:    52 years         BP:           112/36 mmHg Patient Gender: M                HR:           96 bpm. Exam Location:  Outpatient  Procedure: Transesophageal Echo, Cardiac Doppler and Color Doppler  Indications:     aortic valve regurgitation  History:         Patient has prior history of  Echocardiogram examinations, most recent 06/05/2023. CHF, Marfan's syndrome; Risk Factors:Current Smoker and Dyslipidemia.  Sonographer:     Delcie Roch RDCS Referring Phys:  [id number] Petra Kuba MONGE Diagnosing Phys: Chilton Si MD  PROCEDURE: After discussion of the risks and benefits of a TEE, an informed consent was obtained from the patient. The transesophogeal probe was passed without difficulty through the esophogus of the patient. Imaged were obtained with the patient in a left lateral decubitus position. Sedation performed by different physician. The patient was monitored while under deep sedation. Anesthestetic sedation was provided intravenously by Anesthesiology: 255mg  of Propofol. The patient's vital signs; including heart rate, blood pressure, and oxygen saturation; remained stable throughout the procedure. The patient developed no complications during the procedure.  IMPRESSIONS   1. Left ventricular ejection fraction, by estimation, is 60 to 65%. The left ventricle has normal function. The left ventricle has no regional wall motion abnormalities. 2. Right ventricular systolic function is normal. The right ventricular size is normal. 3. No left atrial/left atrial appendage thrombus was detected. 4. Moderate pericardial effusion. 5. The mitral valve is normal in structure. Trivial mitral valve regurgitation. No evidence of mitral stenosis. 6. The aortic valve has been repaired/replaced. Aortic valve regurgitation is severe. Moderate aortic valve stenosis. Aortic regurgitation PHT measures 115 msec. Aortic valve area, by VTI measures 1.56 cm. Aortic valve mean gradient measures 32.0 mmHg. Aortic valve Vmax measures 3.92 m/s. 7. Aortic root/ascending aorta has been repaired/replaced. 8. The inferior vena cava is normal in size with greater than 50% respiratory variability, suggesting right atrial pressure of 3 mmHg. 9. There is a moderately sized patent foramen ovale with  predominantly left to right shunting across the atrial septum.  FINDINGS Left Ventricle: Left ventricular ejection fraction, by estimation, is 60 to 65%. The left ventricle has normal function. The left ventricle has no regional wall  Height:       78.0 in Accession #:    [id number]       Weight:       150.6 lb Date of Birth:  [birth year]        BSA:          2.001 m Patient Age:    52 years         BP:           148/94 mmHg Patient Gender: M                HR:           72 bpm. Exam Location:  [address]  Procedure: 2D Echo, 3D Echo, Cardiac Doppler and Color Doppler  Indications:    R06.00 SOB  History:        Patient has prior history of Echocardiogram examinations, most recent 08/06/2022. Signs/Symptoms:Chest Pain; Risk Factors:HLD and Current Smoker. Marfan  syndrome.  Sonographer:    Clearence Ped RCS Referring Phys: [phone] EMILY C MONGE  IMPRESSIONS   1. Left ventricular ejection fraction, by estimation, is 55 to 60%. The left ventricle has normal function. The left ventricle has no regional wall motion abnormalities. There is mild left ventricular hypertrophy. Left ventricular diastolic parameters are indeterminate. The average left ventricular global longitudinal strain is -16.0 %. 2. Right ventricular systolic function is normal. The right ventricular size is normal. There is moderately elevated pulmonary artery systolic pressure. 3. Possible PFO/ASD Qp/QS 0.5. 4. The mitral valve is grossly normal. Trivial mitral valve regurgitation. 5. S/p bioprosthetic AVR with known restricted R/L cusps. eccentric AI. PHT 198 ms. Has holodiastolic flow reversal. Aortic valve regurgitation is moderate to severe. Moderate aortic valve stenosis. Procedure Date: 12/2005. 6. The inferior vena cava is dilated in size with <50% respiratory variability, suggesting right atrial pressure of 15 mmHg.  Conclusion(s)/Recommendation(s): Compared to prior study, AI has progressed. Based on the above findings, recommend TEE.  FINDINGS Left Ventricle: Left ventricular ejection fraction, by estimation, is 55 to 60%. The left ventricle has normal function. The left ventricle has no regional wall motion abnormalities. The average left ventricular global longitudinal strain is -16.0 %. The left ventricular internal cavity size was normal in size. There is mild left ventricular hypertrophy. Left ventricular diastolic parameters are indeterminate. The ratio of pulmonic flow to systemic flow (Qp/Qs ratio) is 0.50.  Right Ventricle: The right ventricular size is normal. Right ventricular systolic function is normal. There is moderately elevated pulmonary artery systolic pressure. The tricuspid regurgitant velocity is 2.82 m/s, and with an assumed right atrial pressure of 15 mmHg,  the estimated right ventricular systolic pressure is 46.8 mmHg.  Left Atrium: Left atrial size was normal in size.  Right Atrium: Right atrial size was normal in size.  Pericardium: There is no evidence of pericardial effusion.  Mitral Valve: The mitral valve is grossly normal. Trivial mitral valve regurgitation.  Tricuspid Valve: The tricuspid valve is normal in structure. Tricuspid valve regurgitation is mild.  Aortic Valve: S/p bioprosthetic AVR with known restricted R/L cusps. eccentric AI. PHT 198 ms. Has holodiastolic flow reversal. Aortic valve regurgitation is moderate to severe. Aortic regurgitation PHT measures 226 msec. Moderate aortic stenosis is present. Aortic valve mean gradient measures 27.0 mmHg. Aortic valve peak gradient measures 44.1 mmHg. Aortic valve area, by VTI measures 1.22 cm. There is a 27 mm Medtronic bioprosthetic valve present in the aortic position.  Pulmonic Valve: Pulmonic valve regurgitation is trivial.  Aorta: The  Height:       78.0 in Accession #:    [id number]       Weight:       150.6 lb Date of Birth:  [birth year]        BSA:          2.001 m Patient Age:    52 years         BP:           148/94 mmHg Patient Gender: M                HR:           72 bpm. Exam Location:  [address]  Procedure: 2D Echo, 3D Echo, Cardiac Doppler and Color Doppler  Indications:    R06.00 SOB  History:        Patient has prior history of Echocardiogram examinations, most recent 08/06/2022. Signs/Symptoms:Chest Pain; Risk Factors:HLD and Current Smoker. Marfan  syndrome.  Sonographer:    Clearence Ped RCS Referring Phys: [phone] EMILY C MONGE  IMPRESSIONS   1. Left ventricular ejection fraction, by estimation, is 55 to 60%. The left ventricle has normal function. The left ventricle has no regional wall motion abnormalities. There is mild left ventricular hypertrophy. Left ventricular diastolic parameters are indeterminate. The average left ventricular global longitudinal strain is -16.0 %. 2. Right ventricular systolic function is normal. The right ventricular size is normal. There is moderately elevated pulmonary artery systolic pressure. 3. Possible PFO/ASD Qp/QS 0.5. 4. The mitral valve is grossly normal. Trivial mitral valve regurgitation. 5. S/p bioprosthetic AVR with known restricted R/L cusps. eccentric AI. PHT 198 ms. Has holodiastolic flow reversal. Aortic valve regurgitation is moderate to severe. Moderate aortic valve stenosis. Procedure Date: 12/2005. 6. The inferior vena cava is dilated in size with <50% respiratory variability, suggesting right atrial pressure of 15 mmHg.  Conclusion(s)/Recommendation(s): Compared to prior study, AI has progressed. Based on the above findings, recommend TEE.  FINDINGS Left Ventricle: Left ventricular ejection fraction, by estimation, is 55 to 60%. The left ventricle has normal function. The left ventricle has no regional wall motion abnormalities. The average left ventricular global longitudinal strain is -16.0 %. The left ventricular internal cavity size was normal in size. There is mild left ventricular hypertrophy. Left ventricular diastolic parameters are indeterminate. The ratio of pulmonic flow to systemic flow (Qp/Qs ratio) is 0.50.  Right Ventricle: The right ventricular size is normal. Right ventricular systolic function is normal. There is moderately elevated pulmonary artery systolic pressure. The tricuspid regurgitant velocity is 2.82 m/s, and with an assumed right atrial pressure of 15 mmHg,  the estimated right ventricular systolic pressure is 46.8 mmHg.  Left Atrium: Left atrial size was normal in size.  Right Atrium: Right atrial size was normal in size.  Pericardium: There is no evidence of pericardial effusion.  Mitral Valve: The mitral valve is grossly normal. Trivial mitral valve regurgitation.  Tricuspid Valve: The tricuspid valve is normal in structure. Tricuspid valve regurgitation is mild.  Aortic Valve: S/p bioprosthetic AVR with known restricted R/L cusps. eccentric AI. PHT 198 ms. Has holodiastolic flow reversal. Aortic valve regurgitation is moderate to severe. Aortic regurgitation PHT measures 226 msec. Moderate aortic stenosis is present. Aortic valve mean gradient measures 27.0 mmHg. Aortic valve peak gradient measures 44.1 mmHg. Aortic valve area, by VTI measures 1.22 cm. There is a 27 mm Medtronic bioprosthetic valve present in the aortic position.  Pulmonic Valve: Pulmonic valve regurgitation is trivial.  Aorta: The  Height:       78.0 in Accession #:    [id number]       Weight:       150.6 lb Date of Birth:  [birth year]        BSA:          2.001 m Patient Age:    52 years         BP:           148/94 mmHg Patient Gender: M                HR:           72 bpm. Exam Location:  [address]  Procedure: 2D Echo, 3D Echo, Cardiac Doppler and Color Doppler  Indications:    R06.00 SOB  History:        Patient has prior history of Echocardiogram examinations, most recent 08/06/2022. Signs/Symptoms:Chest Pain; Risk Factors:HLD and Current Smoker. Marfan  syndrome.  Sonographer:    Clearence Ped RCS Referring Phys: [phone] EMILY C MONGE  IMPRESSIONS   1. Left ventricular ejection fraction, by estimation, is 55 to 60%. The left ventricle has normal function. The left ventricle has no regional wall motion abnormalities. There is mild left ventricular hypertrophy. Left ventricular diastolic parameters are indeterminate. The average left ventricular global longitudinal strain is -16.0 %. 2. Right ventricular systolic function is normal. The right ventricular size is normal. There is moderately elevated pulmonary artery systolic pressure. 3. Possible PFO/ASD Qp/QS 0.5. 4. The mitral valve is grossly normal. Trivial mitral valve regurgitation. 5. S/p bioprosthetic AVR with known restricted R/L cusps. eccentric AI. PHT 198 ms. Has holodiastolic flow reversal. Aortic valve regurgitation is moderate to severe. Moderate aortic valve stenosis. Procedure Date: 12/2005. 6. The inferior vena cava is dilated in size with <50% respiratory variability, suggesting right atrial pressure of 15 mmHg.  Conclusion(s)/Recommendation(s): Compared to prior study, AI has progressed. Based on the above findings, recommend TEE.  FINDINGS Left Ventricle: Left ventricular ejection fraction, by estimation, is 55 to 60%. The left ventricle has normal function. The left ventricle has no regional wall motion abnormalities. The average left ventricular global longitudinal strain is -16.0 %. The left ventricular internal cavity size was normal in size. There is mild left ventricular hypertrophy. Left ventricular diastolic parameters are indeterminate. The ratio of pulmonic flow to systemic flow (Qp/Qs ratio) is 0.50.  Right Ventricle: The right ventricular size is normal. Right ventricular systolic function is normal. There is moderately elevated pulmonary artery systolic pressure. The tricuspid regurgitant velocity is 2.82 m/s, and with an assumed right atrial pressure of 15 mmHg,  the estimated right ventricular systolic pressure is 46.8 mmHg.  Left Atrium: Left atrial size was normal in size.  Right Atrium: Right atrial size was normal in size.  Pericardium: There is no evidence of pericardial effusion.  Mitral Valve: The mitral valve is grossly normal. Trivial mitral valve regurgitation.  Tricuspid Valve: The tricuspid valve is normal in structure. Tricuspid valve regurgitation is mild.  Aortic Valve: S/p bioprosthetic AVR with known restricted R/L cusps. eccentric AI. PHT 198 ms. Has holodiastolic flow reversal. Aortic valve regurgitation is moderate to severe. Aortic regurgitation PHT measures 226 msec. Moderate aortic stenosis is present. Aortic valve mean gradient measures 27.0 mmHg. Aortic valve peak gradient measures 44.1 mmHg. Aortic valve area, by VTI measures 1.22 cm. There is a 27 mm Medtronic bioprosthetic valve present in the aortic position.  Pulmonic Valve: Pulmonic valve regurgitation is trivial.  Aorta: The  Cardiology Office Note:    Date:  07/07/2023   ID:  BRONC BUCKBEE, DOB 02/13/1971, MRN [id number]  PCP:  Lorre Munroe, NP   [facility] Providers Cardiologist:  Nanetta Batty, MD     Referring MD: Lorre Munroe, NP   Chief Complaint  Patient presents with   Shortness of Breath    History of Present Illness:    Christopher Ball is a 52 y.o. 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  In August of this year he developed acute onset of pain from his left arm across the chest into the right upper arm.  He went to the emergency room as he also had shortness of breath at the time.  All of his symptoms were fairly abrupt in onset.  He was found to have an elevated BNP.  A CTA of the chest, abdomen, and pelvis was performed and showed no evidence of dissection.  He remained short of breath and presented for cardiology follow-up after his ER visit.  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  A TEE demonstrated severe aortic regurgitation with an eccentric AI jet and a prolapsed leaflet.  The patient is here with his wife today.  He continues to feel poorly.  He complains of shortness of breath with low-level activity and orthopnea.  He denies chest discomfort.  He is lightheaded when he bends forward and then raises back up.  He also feels poorly when he exerts and complains of dizziness.  He has been coughing up brown sputum but this is improved since he started furosemide.  He states that all of his symptoms have improved a little  bit since starting furosemide but he continues to feel poorly overall.  The patient has regular dental care and has a few cavities but no other issues.  Current Medications: Current Meds  Medication Sig   atorvastatin (LIPITOR) 20 MG tablet TAKE 1 TABLET (20 MG TOTAL) BY MOUTH DAILY. KEEP OFFICE VISIT FOR FUTURE REFILLS.   clindamycin (CLEOCIN) 300 MG capsule Take 2 tablets 1 hour prior to dental appointment   fluticasone (FLONASE) 50 MCG/ACT nasal spray Place 1 spray into both nostrils daily as needed for rhinitis.   furosemide (LASIX) 40 MG tablet Take 1 tablet (40 mg total) by mouth daily.   levocetirizine (XYZAL) 5 MG tablet Take 5 mg by mouth every evening.   Multiple Vitamins-Minerals (MULTIVITAMIN WITH MINERALS) tablet Take 1 tablet by mouth daily.   tadalafil (CIALIS) 20 MG tablet Take 1 tablet (20 mg total) by mouth daily.     Allergies:   Augmentin [amoxicillin-pot clavulanate]   ROS:   Please see the history of present illness.    All other systems reviewed and are negative.  EKGs/Labs/Other Studies Reviewed:    The following studies were reviewed today: Cardiac Studies & Procedures     STRESS TESTS  NM MYOCAR MULTI W/SPECT W 04/29/2006   ECHOCARDIOGRAM  ECHOCARDIOGRAM COMPLETE 06/05/2023  Narrative ECHOCARDIOGRAM REPORT    Patient Name:   Christopher Ball [facility] Date of Exam: 06/05/2023 Medical Rec #:  [id number]

## 2023-07-07 NOTE — Patient Instructions (Signed)
  Follow-Up: At [facility], you and your health needs are our priority.  As part of our continuing mission to provide you with exceptional heart care, we have created designated Provider Care Teams.  These Care Teams include your primary Cardiologist (physician) and Advanced Practice Providers (APPs -  Physician Assistants and Nurse Practitioners) who all work together to provide you with the care you need, when you need it.  We recommend signing up for the patient portal called "MyChart".  Sign up information is provided on this After Visit Summary.  MyChart is used to connect with patients for Virtual Visits (Telemedicine).  Patients are able to view lab/test results, encounter notes, upcoming appointments, etc.  Non-urgent messages can be sent to your provider as well.   To learn more about what you can do with MyChart, go to [email or web address].    Your next appointment:   Structural Team will follow-up  Provider:   Casimiro Needle Cooper,MD

## 2023-07-09 ENCOUNTER — Telehealth: Payer: Self-pay | Admitting: *Deleted

## 2023-07-09 NOTE — Telephone Encounter (Signed)
Cardiac Catheterization scheduled at [facility] for: Thursday July 10, 2023 7:30 AM Arrival time [facility] Main Entrance A at: 5:30 AM  Nothing to eat after midnight prior to procedure, clear liquids until 5 AM day of procedure.  Medication instructions: -Hold:  Lasix-AM of procedure -Other usual morning medications can be taken with sips of water including aspirin 81 mg.  Plan to go home the same day, you will only stay overnight if medically necessary.  You must have responsible adult to drive you home.  Someone must be with you the first 24 hours after you arrive home.  Reviewed procedure instructions with patient.

## 2023-07-10 ENCOUNTER — Inpatient Hospital Stay (HOSPITAL_COMMUNITY)
Admission: AD | Admit: 2023-07-10 | Discharge: 2023-07-16 | DRG: 266 | Disposition: A | Discharge status: Home / Self Care | Payer: Federal, State, Local not specified - PPO | Attending: Cardiovascular Disease | Admitting: Cardiovascular Disease

## 2023-07-10 ENCOUNTER — Other Ambulatory Visit: Payer: Self-pay | Admitting: Physician Assistant

## 2023-07-10 ENCOUNTER — Other Ambulatory Visit: Payer: Self-pay

## 2023-07-10 ENCOUNTER — Encounter: Payer: Self-pay | Admitting: Physician Assistant

## 2023-07-10 ENCOUNTER — Encounter (HOSPITAL_COMMUNITY)
Admission: AD | Disposition: A | Discharge status: Home / Self Care | Payer: Self-pay | Source: Home / Self Care | Attending: Cardiovascular Disease

## 2023-07-10 DIAGNOSIS — R636 Underweight: Secondary | ICD-10-CM | POA: Diagnosis present

## 2023-07-10 DIAGNOSIS — I5043 Acute on chronic combined systolic (congestive) and diastolic (congestive) heart failure: Secondary | ICD-10-CM | POA: Diagnosis present

## 2023-07-10 DIAGNOSIS — Z833 Family history of diabetes mellitus: Secondary | ICD-10-CM

## 2023-07-10 DIAGNOSIS — Z881 Allergy status to other antibiotic agents status: Secondary | ICD-10-CM

## 2023-07-10 DIAGNOSIS — I351 Nonrheumatic aortic (valve) insufficiency: Secondary | ICD-10-CM | POA: Diagnosis present

## 2023-07-10 DIAGNOSIS — Z006 Encounter for examination for normal comparison and control in clinical research program: Secondary | ICD-10-CM

## 2023-07-10 DIAGNOSIS — I272 Pulmonary hypertension, unspecified: Secondary | ICD-10-CM | POA: Diagnosis not present

## 2023-07-10 DIAGNOSIS — Q8740 Marfan's syndrome, unspecified: Secondary | ICD-10-CM | POA: Diagnosis not present

## 2023-07-10 DIAGNOSIS — R570 Cardiogenic shock: Secondary | ICD-10-CM

## 2023-07-10 DIAGNOSIS — N179 Acute kidney failure, unspecified: Secondary | ICD-10-CM | POA: Diagnosis not present

## 2023-07-10 DIAGNOSIS — R7401 Elevation of levels of liver transaminase levels: Secondary | ICD-10-CM | POA: Diagnosis present

## 2023-07-10 DIAGNOSIS — N170 Acute kidney failure with tubular necrosis: Secondary | ICD-10-CM | POA: Diagnosis not present

## 2023-07-10 DIAGNOSIS — Y831 Surgical operation with implant of artificial internal device as the cause of abnormal reaction of the patient, or of later complication, without mention of misadventure at the time of the procedure: Secondary | ICD-10-CM | POA: Diagnosis present

## 2023-07-10 DIAGNOSIS — Z8249 Family history of ischemic heart disease and other diseases of the circulatory system: Secondary | ICD-10-CM | POA: Diagnosis not present

## 2023-07-10 DIAGNOSIS — Q87410 Marfan's syndrome with aortic dilation: Secondary | ICD-10-CM

## 2023-07-10 DIAGNOSIS — Z79899 Other long term (current) drug therapy: Secondary | ICD-10-CM | POA: Diagnosis not present

## 2023-07-10 DIAGNOSIS — Q263 Partial anomalous pulmonary venous connection: Secondary | ICD-10-CM | POA: Diagnosis not present

## 2023-07-10 DIAGNOSIS — Q2112 Patent foramen ovale: Secondary | ICD-10-CM

## 2023-07-10 DIAGNOSIS — F17210 Nicotine dependence, cigarettes, uncomplicated: Secondary | ICD-10-CM | POA: Diagnosis not present

## 2023-07-10 DIAGNOSIS — N182 Chronic kidney disease, stage 2 (mild): Secondary | ICD-10-CM | POA: Diagnosis present

## 2023-07-10 DIAGNOSIS — T82897A Other specified complication of cardiac prosthetic devices, implants and grafts, initial encounter: Principal | ICD-10-CM | POA: Diagnosis present

## 2023-07-10 DIAGNOSIS — I719 Aortic aneurysm of unspecified site, without rupture: Secondary | ICD-10-CM | POA: Diagnosis not present

## 2023-07-10 DIAGNOSIS — J439 Emphysema, unspecified: Secondary | ICD-10-CM | POA: Diagnosis present

## 2023-07-10 DIAGNOSIS — I350 Nonrheumatic aortic (valve) stenosis: Secondary | ICD-10-CM | POA: Diagnosis not present

## 2023-07-10 DIAGNOSIS — Z825 Family history of asthma and other chronic lower respiratory diseases: Secondary | ICD-10-CM

## 2023-07-10 DIAGNOSIS — I509 Heart failure, unspecified: Secondary | ICD-10-CM | POA: Diagnosis not present

## 2023-07-10 DIAGNOSIS — I5033 Acute on chronic diastolic (congestive) heart failure: Principal | ICD-10-CM | POA: Diagnosis present

## 2023-07-10 DIAGNOSIS — Z681 Body mass index (BMI) 19 or less, adult: Secondary | ICD-10-CM | POA: Diagnosis not present

## 2023-07-10 DIAGNOSIS — Z952 Presence of prosthetic heart valve: Secondary | ICD-10-CM

## 2023-07-10 HISTORY — PX: RIGHT/LEFT HEART CATH AND CORONARY ANGIOGRAPHY: CATH118266

## 2023-07-10 LAB — POCT I-STAT EG7
Acid-base deficit: 2 mmol/L (ref 0.0–2.0)
Acid-base deficit: 2 mmol/L (ref 0.0–2.0)
Acid-base deficit: 2 mmol/L (ref 0.0–2.0)
Acid-base deficit: 3 mmol/L — ABNORMAL HIGH (ref 0.0–2.0)
Acid-base deficit: 4 mmol/L — ABNORMAL HIGH (ref 0.0–2.0)
Acid-base deficit: 3 mmol/L — ABNORMAL HIGH (ref 0.0–2.0)
Bicarbonate: 21.2 mmol/L (ref 20.0–28.0)
Bicarbonate: 22.1 mmol/L (ref 20.0–28.0)
Bicarbonate: 22.1 mmol/L (ref 20.0–28.0)
Bicarbonate: 22.4 mmol/L (ref 20.0–28.0)
Bicarbonate: 22.9 mmol/L (ref 20.0–28.0)
Bicarbonate: 21.9 mmol/L (ref 20.0–28.0)
Calcium, Ion: 1.13 mmol/L — ABNORMAL LOW (ref 1.15–1.40)
Calcium, Ion: 1.17 mmol/L (ref 1.15–1.40)
Calcium, Ion: 1.18 mmol/L (ref 1.15–1.40)
Calcium, Ion: 1.18 mmol/L (ref 1.15–1.40)
Calcium, Ion: 1.21 mmol/L (ref 1.15–1.40)
Calcium, Ion: 1.16 mmol/L (ref 1.15–1.40)
HCT: 36 % — ABNORMAL LOW (ref 39.0–52.0)
HCT: 37 % — ABNORMAL LOW (ref 39.0–52.0)
HCT: 37 % — ABNORMAL LOW (ref 39.0–52.0)
HCT: 37 % — ABNORMAL LOW (ref 39.0–52.0)
HCT: 37 % — ABNORMAL LOW (ref 39.0–52.0)
HCT: 36 % — ABNORMAL LOW (ref 39.0–52.0)
Hemoglobin: 12.2 g/dL — ABNORMAL LOW (ref 13.0–17.0)
Hemoglobin: 12.6 g/dL — ABNORMAL LOW (ref 13.0–17.0)
Hemoglobin: 12.6 g/dL — ABNORMAL LOW (ref 13.0–17.0)
Hemoglobin: 12.6 g/dL — ABNORMAL LOW (ref 13.0–17.0)
Hemoglobin: 12.6 g/dL — ABNORMAL LOW (ref 13.0–17.0)
Hemoglobin: 12.2 g/dL — ABNORMAL LOW (ref 13.0–17.0)
O2 Saturation: 39 %
O2 Saturation: 40 %
O2 Saturation: 42 %
O2 Saturation: 56 %
O2 Saturation: 57 %
O2 Saturation: 57 %
Potassium: 4 mmol/L (ref 3.5–5.1)
Potassium: 4.1 mmol/L (ref 3.5–5.1)
Potassium: 4.2 mmol/L (ref 3.5–5.1)
Potassium: 4.2 mmol/L (ref 3.5–5.1)
Potassium: 4.2 mmol/L (ref 3.5–5.1)
Potassium: 4.2 mmol/L (ref 3.5–5.1)
Sodium: 133 mmol/L — ABNORMAL LOW (ref 135–145)
Sodium: 141 mmol/L (ref 135–145)
Sodium: 141 mmol/L (ref 135–145)
Sodium: 141 mmol/L (ref 135–145)
Sodium: 142 mmol/L (ref 135–145)
Sodium: 140 mmol/L (ref 135–145)
TCO2: 22 mmol/L (ref 22–32)
TCO2: 23 mmol/L (ref 22–32)
TCO2: 23 mmol/L (ref 22–32)
TCO2: 24 mmol/L (ref 22–32)
TCO2: 24 mmol/L (ref 22–32)
TCO2: 23 mmol/L (ref 22–32)
pCO2, Ven: 35.5 mm[Hg] — ABNORMAL LOW (ref 44–60)
pCO2, Ven: 37.2 mm[Hg] — ABNORMAL LOW (ref 44–60)
pCO2, Ven: 37.7 mm[Hg] — ABNORMAL LOW (ref 44–60)
pCO2, Ven: 37.9 mm[Hg] — ABNORMAL LOW (ref 44–60)
pCO2, Ven: 40.6 mm[Hg] — ABNORMAL LOW (ref 44–60)
pCO2, Ven: 38.3 mm[Hg] — ABNORMAL LOW (ref 44–60)
pH, Ven: 7.326 (ref 7.25–7.43)
pH, Ven: 7.382 (ref 7.25–7.43)
pH, Ven: 7.382 (ref 7.25–7.43)
pH, Ven: 7.389 (ref 7.25–7.43)
pH, Ven: 7.401 (ref 7.25–7.43)
pH, Ven: 7.365 (ref 7.25–7.43)
pO2, Ven: 23 mm[Hg] — CL (ref 32–45)
pO2, Ven: 24 mm[Hg] — CL (ref 32–45)
pO2, Ven: 24 mm[Hg] — CL (ref 32–45)
pO2, Ven: 30 mm[Hg] — CL (ref 32–45)
pO2, Ven: 30 mm[Hg] — CL (ref 32–45)
pO2, Ven: 31 mm[Hg] — CL (ref 32–45)

## 2023-07-10 LAB — CBC
HCT: 40.8 % (ref 39.0–52.0)
Hemoglobin: 13.2 g/dL (ref 13.0–17.0)
MCH: 31.7 pg (ref 26.0–34.0)
MCHC: 32.4 g/dL (ref 30.0–36.0)
MCV: 97.8 fL (ref 80.0–100.0)
Platelets: 202 10*3/uL (ref 150–400)
RBC: 4.17 MIL/uL — ABNORMAL LOW (ref 4.22–5.81)
RDW: 13.3 % (ref 11.5–15.5)
WBC: 10.2 10*3/uL (ref 4.0–10.5)
nRBC: 0 % (ref 0.0–0.2)

## 2023-07-10 LAB — POCT I-STAT 7, (LYTES, BLD GAS, ICA,H+H)
Acid-base deficit: 4 mmol/L — ABNORMAL HIGH (ref 0.0–2.0)
Bicarbonate: 20.5 mmol/L (ref 20.0–28.0)
Calcium, Ion: 1.2 mmol/L (ref 1.15–1.40)
HCT: 37 % — ABNORMAL LOW (ref 39.0–52.0)
Hemoglobin: 12.6 g/dL — ABNORMAL LOW (ref 13.0–17.0)
O2 Saturation: 84 %
Potassium: 4.2 mmol/L (ref 3.5–5.1)
Sodium: 141 mmol/L (ref 135–145)
TCO2: 22 mmol/L (ref 22–32)
pCO2 arterial: 34.8 mm[Hg] (ref 32–48)
pH, Arterial: 7.378 (ref 7.35–7.45)
pO2, Arterial: 49 mm[Hg] — ABNORMAL LOW (ref 83–108)

## 2023-07-10 LAB — BRAIN NATRIURETIC PEPTIDE: B Natriuretic Peptide: >4500 pg/mL — ABNORMAL HIGH (ref 0.0–100.0)

## 2023-07-10 LAB — BASIC METABOLIC PANEL
Anion gap: 9 (ref 5–15)
BUN: 42 mg/dL — ABNORMAL HIGH (ref 6–20)
CO2: 21 mmol/L — ABNORMAL LOW (ref 22–32)
Calcium: 7.9 mg/dL — ABNORMAL LOW (ref 8.9–10.3)
Chloride: 109 mmol/L (ref 98–111)
Creatinine, Ser: 1.88 mg/dL — ABNORMAL HIGH (ref 0.61–1.24)
GFR, Estimated: 43 mL/min — ABNORMAL LOW (ref >60)
Glucose, Bld: 154 mg/dL — ABNORMAL HIGH (ref 70–99)
Potassium: 4 mmol/L (ref 3.5–5.1)
Sodium: 139 mmol/L (ref 135–145)

## 2023-07-10 LAB — HIV ANTIBODY (ROUTINE TESTING W REFLEX): HIV Screen 4th Generation wRfx: NONREACTIVE

## 2023-07-10 SURGERY — RIGHT/LEFT HEART CATH AND CORONARY ANGIOGRAPHY
Anesthesia: LOCAL

## 2023-07-10 MED ORDER — ATORVASTATIN CALCIUM 10 MG PO TABS: 20.0000 mg | ORAL_TABLET | Freq: Every day | ORAL | Status: DC

## 2023-07-10 MED ORDER — SODIUM CHLORIDE 0.9 % WEIGHT BASED INFUSION: 1.0000 mL/kg/h | INTRAVENOUS | Status: DC

## 2023-07-10 MED ORDER — VERAPAMIL HCL 2.5 MG/ML IV SOLN
INTRAVENOUS | Status: DC | PRN
  Administered 2023-07-10: 10 mL via INTRA_ARTERIAL

## 2023-07-10 MED ORDER — FUROSEMIDE 10 MG/ML IJ SOLN: 40.0000 mg | Freq: Two times a day (BID) | INTRAMUSCULAR | Status: DC

## 2023-07-10 MED ORDER — SODIUM CHLORIDE 0.9% FLUSH
3.0000 mL | Freq: Two times a day (BID) | INTRAVENOUS | Status: DC
  Administered 2023-07-11 – 2023-07-12 (×3): 3 mL via INTRAVENOUS

## 2023-07-10 MED ORDER — LORATADINE 10 MG PO TABS
10.0000 mg | ORAL_TABLET | Freq: Every day | ORAL | Status: DC
  Administered 2023-07-11 – 2023-07-16 (×5): 10 mg via ORAL
  Filled 2023-07-10 (×5): qty 1

## 2023-07-10 MED ORDER — FUROSEMIDE 10 MG/ML IJ SOLN
INTRAMUSCULAR | Status: AC
  Filled 2023-07-10: qty 4

## 2023-07-10 MED ORDER — POTASSIUM CHLORIDE CRYS ER 20 MEQ PO TBCR: 20.0000 meq | EXTENDED_RELEASE_TABLET | Freq: Every day | ORAL | Status: DC

## 2023-07-10 MED ORDER — HEPARIN SODIUM (PORCINE) 1000 UNIT/ML IJ SOLN
INTRAMUSCULAR | Status: AC
  Filled 2023-07-10: qty 10

## 2023-07-10 MED ORDER — ASPIRIN 81 MG PO CHEW
81.0000 mg | CHEWABLE_TABLET | ORAL | Status: AC
Start: 2023-07-10 — End: 2023-07-10
  Administered 2023-07-10: 81 mg via ORAL
  Filled 2023-07-10: qty 1

## 2023-07-10 MED ORDER — LEVOCETIRIZINE DIHYDROCHLORIDE 5 MG PO TABS: 5.0000 mg | ORAL_TABLET | Freq: Every evening | ORAL | Status: DC

## 2023-07-10 MED ORDER — FENTANYL CITRATE (PF) 100 MCG/2ML IJ SOLN
INTRAMUSCULAR | Status: DC | PRN
  Administered 2023-07-10: 25 ug via INTRAVENOUS
  Administered 2023-07-10: 50 ug via INTRAVENOUS
  Administered 2023-07-10: 25 ug via INTRAVENOUS

## 2023-07-10 MED ORDER — FENTANYL CITRATE (PF) 100 MCG/2ML IJ SOLN
INTRAMUSCULAR | Status: AC
  Filled 2023-07-10: qty 2

## 2023-07-10 MED ORDER — SODIUM CHLORIDE 0.9% FLUSH
10.0000 mL | Freq: Two times a day (BID) | INTRAVENOUS | Status: DC
  Administered 2023-07-11 – 2023-07-12 (×2): 10 mL via INTRAVENOUS

## 2023-07-10 MED ORDER — VERAPAMIL HCL 2.5 MG/ML IV SOLN
INTRAVENOUS | Status: AC
  Filled 2023-07-10: qty 2

## 2023-07-10 MED ORDER — ENOXAPARIN SODIUM 40 MG/0.4ML IJ SOSY: 40.0000 mg | PREFILLED_SYRINGE | INTRAMUSCULAR | Status: DC

## 2023-07-10 MED ORDER — MIDAZOLAM HCL 2 MG/2ML IJ SOLN
INTRAMUSCULAR | Status: DC | PRN
  Administered 2023-07-10 (×2): 1 mg via INTRAVENOUS
  Administered 2023-07-10: 2 mg via INTRAVENOUS

## 2023-07-10 MED ORDER — ONDANSETRON HCL 4 MG/2ML IJ SOLN
4.0000 mg | Freq: Four times a day (QID) | INTRAMUSCULAR | Status: DC | PRN
  Administered 2023-07-11 (×2): 4 mg via INTRAVENOUS
  Filled 2023-07-10 (×2): qty 2

## 2023-07-10 MED ORDER — HEPARIN SODIUM (PORCINE) 1000 UNIT/ML IJ SOLN
INTRAMUSCULAR | Status: DC | PRN
  Administered 2023-07-10: 3000 [IU] via INTRAVENOUS

## 2023-07-10 MED ORDER — LIDOCAINE HCL (PF) 1 % IJ SOLN
INTRAMUSCULAR | Status: AC
  Filled 2023-07-10: qty 30

## 2023-07-10 MED ORDER — MIDAZOLAM HCL 2 MG/2ML IJ SOLN
INTRAMUSCULAR | Status: AC
  Filled 2023-07-10: qty 2

## 2023-07-10 MED ORDER — HEPARIN (PORCINE) IN NACL 1000-0.9 UT/500ML-% IV SOLN
INTRAVENOUS | Status: DC | PRN
  Administered 2023-07-10: 1000 mL

## 2023-07-10 MED ORDER — IOHEXOL 350 MG/ML SOLN
INTRAVENOUS | Status: DC | PRN
  Administered 2023-07-10: 77 mL

## 2023-07-10 MED ORDER — ACETAMINOPHEN 325 MG PO TABS
650.0000 mg | ORAL_TABLET | ORAL | Status: DC | PRN
  Administered 2023-07-11 – 2023-07-14 (×2): 650 mg via ORAL
  Filled 2023-07-10 (×3): qty 2

## 2023-07-10 MED ORDER — BISACODYL 5 MG PO TBEC
5.0000 mg | DELAYED_RELEASE_TABLET | Freq: Once | ORAL | Status: AC
  Administered 2023-07-10: 5 mg via ORAL
  Filled 2023-07-10: qty 1

## 2023-07-10 MED ORDER — SODIUM CHLORIDE 0.9 % WEIGHT BASED INFUSION
3.0000 mL/kg/h | INTRAVENOUS | Status: DC
  Administered 2023-07-10: 3 mL/kg/h via INTRAVENOUS

## 2023-07-10 MED ORDER — LIDOCAINE HCL (PF) 1 % IJ SOLN
INTRAMUSCULAR | Status: DC | PRN
  Administered 2023-07-10 (×2): 2 mL

## 2023-07-10 MED ORDER — POLYETHYLENE GLYCOL 3350 17 G PO PACK
17.0000 g | PACK | Freq: Every day | ORAL | Status: DC
  Filled 2023-07-10 (×3): qty 1

## 2023-07-10 MED ORDER — SENNOSIDES-DOCUSATE SODIUM 8.6-50 MG PO TABS
1.0000 | ORAL_TABLET | Freq: Every day | ORAL | Status: DC
  Administered 2023-07-10 – 2023-07-15 (×6): 1 via ORAL
  Filled 2023-07-10 (×5): qty 1

## 2023-07-10 MED ORDER — SODIUM CHLORIDE 0.9% FLUSH
3.0000 mL | INTRAVENOUS | Status: DC | PRN
Start: 2023-07-10 — End: 2023-07-13

## 2023-07-10 MED ORDER — FUROSEMIDE 10 MG/ML IJ SOLN
40.0000 mg | Freq: Every day | INTRAMUSCULAR | Status: DC
  Administered 2023-07-10: 40 mg via INTRAVENOUS

## 2023-07-10 SURGICAL SUPPLY — 10 items
CATH 5FR JL3.5 JR4 ANG PIG MP (CATHETERS) ×1
CATH BALLN WEDGE 5F 110CM (CATHETERS) ×1
DEVICE RAD COMP TR BAND LRG (VASCULAR PRODUCTS) ×1
GLIDESHEATH SLEND SS 6F .021 (SHEATH) ×1
GUIDEWIRE .025 260CM (WIRE) ×1
INQWIRE 1.5J .035X260CM (WIRE) ×1
KIT SYRINGE INJ CVI SPIKEX1 (MISCELLANEOUS) ×1
PACK CARDIAC CATHETERIZATION (CUSTOM PROCEDURE TRAY) ×1
SET ATX-X65L (MISCELLANEOUS) ×1
SHEATH GLIDE SLENDER 4/5FR (SHEATH) ×1

## 2023-07-10 NOTE — Interval H&P Note (Signed)
After valve team meeting we have recommended R/L heart cath for further evaluation.  History and Physical Interval Note:  07/10/2023 7:44 AM  Christopher Ball  has presented today for surgery, with the diagnosis of aortic insufficency.  The various methods of treatment have been discussed with the patient and family. After consideration of risks, benefits and other options for treatment, the patient has consented to  Procedure(s): RIGHT/LEFT HEART CATH AND CORONARY ANGIOGRAPHY (N/A) as a surgical intervention.  The patient's history has been reviewed, patient examined, no change in status, stable for surgery.  I have reviewed the patient's chart and labs.  Questions were answered to the patient's satisfaction.     Tonny Bollman

## 2023-07-10 NOTE — Progress Notes (Signed)
TR BAND REMOVAL  LOCATION:   right radial  DEFLATED PER PROTOCOL:  yes   TIME BAND OFF / DRESSING APPLIED:    1445  SITE UPON ARRIVAL:    Level 0  SITE AFTER BAND REMOVAL:    Level 0  CIRCULATION SENSATION AND MOVEMENT:    Within Normal Limits : yes  COMMENTS:

## 2023-07-10 NOTE — Progress Notes (Signed)
STS Risk Calculator  Procedure Type: Isolated AVR Perioperative Outcome Estimate % Operative Mortality 0.719% Morbidity & Mortality 8.59% Stroke 1.12% Renal Failure 1.2% Reoperation 4.32% Prolonged Ventilation 3.99% Deep Sternal Wound Infection 0.065% Long Hospital Stay (>14 days) 3.79% Short Hospital Stay (<6 days)* 45.9%

## 2023-07-10 NOTE — H&P (Signed)
Expand All Collapse All  Cardiology Office Note:     Date:  07/07/2023    ID:  Christopher Ball, DOB December 20, 1970, MRN [id number]   PCP:  Lorre Munroe, NP              [facility] Providers Cardiologist:  Nanetta Batty, MD      Referring MD: Lorre Munroe, NP       Chief Complaint  Patient presents with   Shortness of Breath      History of Present Illness:     Christopher Ball is a 52 y.o. 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   In August of this year he developed acute onset of pain from his left arm across the chest into the right upper arm.  He went to the emergency room as he also had shortness of breath at the time.  All of his symptoms were fairly abrupt in onset.  He was found to have an elevated BNP.  A CTA of the chest, abdomen, and pelvis was performed and showed no evidence of dissection.  He remained short of breath and presented for cardiology follow-up after his ER visit.  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  A TEE demonstrated severe aortic regurgitation with an eccentric AI jet and a prolapsed leaflet.   The patient is here with his wife today.  He continues to feel poorly.  He complains of shortness of breath with low-level activity and orthopnea.  He denies chest discomfort.  He is lightheaded when he bends forward and then raises back up.  He also feels poorly when he exerts and complains of dizziness.  He has been coughing up brown sputum but this is improved since he started furosemide.  He  states that all of his symptoms have improved a little bit since starting furosemide but he continues to feel poorly overall.  The patient has regular dental care and has a few cavities but no other issues.   Current Medications: Active Medications      Current Meds  Medication Sig   atorvastatin (LIPITOR) 20 MG tablet TAKE 1 TABLET (20 MG TOTAL) BY MOUTH DAILY. KEEP OFFICE VISIT FOR FUTURE REFILLS.   clindamycin (CLEOCIN) 300 MG capsule Take 2 tablets 1 hour prior to dental appointment   fluticasone (FLONASE) 50 MCG/ACT nasal spray Place 1 spray into both nostrils daily as needed for rhinitis.   furosemide (LASIX) 40 MG tablet Take 1 tablet (40 mg total) by mouth daily.   levocetirizine (XYZAL) 5 MG tablet Take 5 mg by mouth every evening.   Multiple Vitamins-Minerals (MULTIVITAMIN WITH MINERALS) tablet Take 1 tablet by mouth daily.   tadalafil (CIALIS) 20 MG tablet Take 1 tablet (20 mg total) by mouth daily.        Allergies:   Augmentin [amoxicillin-pot clavulanate]    ROS:   Please see the history of present illness.    All other systems reviewed and are negative.   EKGs/Labs/Other Studies Reviewed:     The following studies were reviewed today: Cardiac Studies & Procedures  Expand All Collapse All  Cardiology Office Note:     Date:  07/07/2023    ID:  Christopher Ball, DOB December 20, 1970, MRN [id number]   PCP:  Lorre Munroe, NP              [facility] Providers Cardiologist:  Nanetta Batty, MD      Referring MD: Lorre Munroe, NP       Chief Complaint  Patient presents with   Shortness of Breath      History of Present Illness:     Christopher Ball is a 52 y.o. 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   In August of this year he developed acute onset of pain from his left arm across the chest into the right upper arm.  He went to the emergency room as he also had shortness of breath at the time.  All of his symptoms were fairly abrupt in onset.  He was found to have an elevated BNP.  A CTA of the chest, abdomen, and pelvis was performed and showed no evidence of dissection.  He remained short of breath and presented for cardiology follow-up after his ER visit.  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  A TEE demonstrated severe aortic regurgitation with an eccentric AI jet and a prolapsed leaflet.   The patient is here with his wife today.  He continues to feel poorly.  He complains of shortness of breath with low-level activity and orthopnea.  He denies chest discomfort.  He is lightheaded when he bends forward and then raises back up.  He also feels poorly when he exerts and complains of dizziness.  He has been coughing up brown sputum but this is improved since he started furosemide.  He  states that all of his symptoms have improved a little bit since starting furosemide but he continues to feel poorly overall.  The patient has regular dental care and has a few cavities but no other issues.   Current Medications: Active Medications      Current Meds  Medication Sig   atorvastatin (LIPITOR) 20 MG tablet TAKE 1 TABLET (20 MG TOTAL) BY MOUTH DAILY. KEEP OFFICE VISIT FOR FUTURE REFILLS.   clindamycin (CLEOCIN) 300 MG capsule Take 2 tablets 1 hour prior to dental appointment   fluticasone (FLONASE) 50 MCG/ACT nasal spray Place 1 spray into both nostrils daily as needed for rhinitis.   furosemide (LASIX) 40 MG tablet Take 1 tablet (40 mg total) by mouth daily.   levocetirizine (XYZAL) 5 MG tablet Take 5 mg by mouth every evening.   Multiple Vitamins-Minerals (MULTIVITAMIN WITH MINERALS) tablet Take 1 tablet by mouth daily.   tadalafil (CIALIS) 20 MG tablet Take 1 tablet (20 mg total) by mouth daily.        Allergies:   Augmentin [amoxicillin-pot clavulanate]    ROS:   Please see the history of present illness.    All other systems reviewed and are negative.   EKGs/Labs/Other Studies Reviewed:     The following studies were reviewed today: Cardiac Studies & Procedures  Expand All Collapse All  Cardiology Office Note:     Date:  07/07/2023    ID:  Christopher Ball, DOB December 20, 1970, MRN [id number]   PCP:  Lorre Munroe, NP              [facility] Providers Cardiologist:  Nanetta Batty, MD      Referring MD: Lorre Munroe, NP       Chief Complaint  Patient presents with   Shortness of Breath      History of Present Illness:     Christopher Ball is a 52 y.o. 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   In August of this year he developed acute onset of pain from his left arm across the chest into the right upper arm.  He went to the emergency room as he also had shortness of breath at the time.  All of his symptoms were fairly abrupt in onset.  He was found to have an elevated BNP.  A CTA of the chest, abdomen, and pelvis was performed and showed no evidence of dissection.  He remained short of breath and presented for cardiology follow-up after his ER visit.  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  A TEE demonstrated severe aortic regurgitation with an eccentric AI jet and a prolapsed leaflet.   The patient is here with his wife today.  He continues to feel poorly.  He complains of shortness of breath with low-level activity and orthopnea.  He denies chest discomfort.  He is lightheaded when he bends forward and then raises back up.  He also feels poorly when he exerts and complains of dizziness.  He has been coughing up brown sputum but this is improved since he started furosemide.  He  states that all of his symptoms have improved a little bit since starting furosemide but he continues to feel poorly overall.  The patient has regular dental care and has a few cavities but no other issues.   Current Medications: Active Medications      Current Meds  Medication Sig   atorvastatin (LIPITOR) 20 MG tablet TAKE 1 TABLET (20 MG TOTAL) BY MOUTH DAILY. KEEP OFFICE VISIT FOR FUTURE REFILLS.   clindamycin (CLEOCIN) 300 MG capsule Take 2 tablets 1 hour prior to dental appointment   fluticasone (FLONASE) 50 MCG/ACT nasal spray Place 1 spray into both nostrils daily as needed for rhinitis.   furosemide (LASIX) 40 MG tablet Take 1 tablet (40 mg total) by mouth daily.   levocetirizine (XYZAL) 5 MG tablet Take 5 mg by mouth every evening.   Multiple Vitamins-Minerals (MULTIVITAMIN WITH MINERALS) tablet Take 1 tablet by mouth daily.   tadalafil (CIALIS) 20 MG tablet Take 1 tablet (20 mg total) by mouth daily.        Allergies:   Augmentin [amoxicillin-pot clavulanate]    ROS:   Please see the history of present illness.    All other systems reviewed and are negative.   EKGs/Labs/Other Studies Reviewed:     The following studies were reviewed today: Cardiac Studies & Procedures  0.39 AI PHT:            226 msec   AORTA Ao Root diam: 3.50 cm Ao Asc diam:  2.40 cm   MITRAL VALVE                TRICUSPID VALVE MV Area (PHT):              TR Peak grad:   31.8 mmHg MV Decel Time:              TR Vmax:        282.00 cm/s MV E velocity: 42.40 cm/s   Estimated RAP:  3.00 mmHg MV A velocity: 107.00 cm/s  RVSP:           34.8 mmHg MV E/A ratio:  0.40 SHUNTS Systemic VTI:  0.26 m Systemic Diam: 2.00 cm Pulmonic VTI:  0.098 m Pulmonic Diam: 2.40 cm Qp/Qs:         0.55   Carolan Clines Electronically signed by Carolan Clines Signature Date/Time: 06/05/2023/11:17:43 AM       Final   TEE   ECHO TEE 06/25/2023   Narrative TRANSESOPHOGEAL ECHO REPORT       Patient Name:   Christopher Ball [facility] Date of Exam: 06/25/2023 Medical Rec #:  [id number]        Height:       78.0 in Accession #:    [id number]       Weight:       155.0 lb Date of Birth:  [birth year]        BSA:          2.026 m Patient Age:    52 years         BP:           112/36 mmHg Patient Gender: M                HR:           96 bpm. Exam Location:  Outpatient   Procedure: Transesophageal Echo,  Cardiac Doppler and Color Doppler   Indications:     aortic valve regurgitation   History:         Patient has prior history of Echocardiogram examinations, most recent 06/05/2023. CHF, Marfan's syndrome; Risk Factors:Current Ball and Dyslipidemia.   Sonographer:     Delcie Roch RDCS Referring Phys:  [id number] Petra Kuba MONGE Diagnosing Phys: Chilton Si MD   PROCEDURE: After discussion of the risks and benefits of a TEE, an informed consent was obtained from the patient. The transesophogeal probe was passed without difficulty through the esophogus of the patient. Imaged were obtained with the patient in a left lateral decubitus position. Sedation performed by different physician. The patient was monitored while under deep sedation. Anesthestetic sedation was provided intravenously by Anesthesiology: 255mg  of Propofol. The patient's vital signs; including heart rate, blood pressure, and oxygen saturation; remained stable throughout the procedure. The patient developed no complications during the procedure.   IMPRESSIONS     1. Left ventricular ejection fraction, by estimation, is 60 to 65%. The left ventricle has normal function. The left ventricle has no regional wall motion abnormalities. 2. Right ventricular systolic function is normal. The right ventricular size is normal. 3. No left atrial/left atrial appendage thrombus was detected. 4. Moderate pericardial effusion. 5. The mitral valve is normal in structure. Trivial mitral valve regurgitation. No evidence of mitral stenosis. 6. The aortic valve has been repaired/replaced. Aortic valve regurgitation is severe.  0.39 AI PHT:            226 msec   AORTA Ao Root diam: 3.50 cm Ao Asc diam:  2.40 cm   MITRAL VALVE                TRICUSPID VALVE MV Area (PHT):              TR Peak grad:   31.8 mmHg MV Decel Time:              TR Vmax:        282.00 cm/s MV E velocity: 42.40 cm/s   Estimated RAP:  3.00 mmHg MV A velocity: 107.00 cm/s  RVSP:           34.8 mmHg MV E/A ratio:  0.40 SHUNTS Systemic VTI:  0.26 m Systemic Diam: 2.00 cm Pulmonic VTI:  0.098 m Pulmonic Diam: 2.40 cm Qp/Qs:         0.55   Carolan Clines Electronically signed by Carolan Clines Signature Date/Time: 06/05/2023/11:17:43 AM       Final   TEE   ECHO TEE 06/25/2023   Narrative TRANSESOPHOGEAL ECHO REPORT       Patient Name:   Christopher Ball [facility] Date of Exam: 06/25/2023 Medical Rec #:  [id number]        Height:       78.0 in Accession #:    [id number]       Weight:       155.0 lb Date of Birth:  [birth year]        BSA:          2.026 m Patient Age:    52 years         BP:           112/36 mmHg Patient Gender: M                HR:           96 bpm. Exam Location:  Outpatient   Procedure: Transesophageal Echo,  Cardiac Doppler and Color Doppler   Indications:     aortic valve regurgitation   History:         Patient has prior history of Echocardiogram examinations, most recent 06/05/2023. CHF, Marfan's syndrome; Risk Factors:Current Ball and Dyslipidemia.   Sonographer:     Delcie Roch RDCS Referring Phys:  [id number] Petra Kuba MONGE Diagnosing Phys: Chilton Si MD   PROCEDURE: After discussion of the risks and benefits of a TEE, an informed consent was obtained from the patient. The transesophogeal probe was passed without difficulty through the esophogus of the patient. Imaged were obtained with the patient in a left lateral decubitus position. Sedation performed by different physician. The patient was monitored while under deep sedation. Anesthestetic sedation was provided intravenously by Anesthesiology: 255mg  of Propofol. The patient's vital signs; including heart rate, blood pressure, and oxygen saturation; remained stable throughout the procedure. The patient developed no complications during the procedure.   IMPRESSIONS     1. Left ventricular ejection fraction, by estimation, is 60 to 65%. The left ventricle has normal function. The left ventricle has no regional wall motion abnormalities. 2. Right ventricular systolic function is normal. The right ventricular size is normal. 3. No left atrial/left atrial appendage thrombus was detected. 4. Moderate pericardial effusion. 5. The mitral valve is normal in structure. Trivial mitral valve regurgitation. No evidence of mitral stenosis. 6. The aortic valve has been repaired/replaced. Aortic valve regurgitation is severe.  Objective   STRESS TESTS   NM MYOCAR MULTI W/SPECT W 04/29/2006   ECHOCARDIOGRAM   ECHOCARDIOGRAM COMPLETE 06/05/2023   Narrative ECHOCARDIOGRAM REPORT       Patient Name:   Christopher Ball [facility] Date of Exam: 06/05/2023 Medical Rec #:  [id number]        Height:       78.0 in Accession #:    [id number]       Weight:       150.6 lb Date of Birth:  29-Apr-1971        BSA:          2.001 m Patient Age:    52 years         BP:           148/94 mmHg Patient Gender: M                HR:           72 bpm. Exam Location:  [address]   Procedure: 2D Echo, 3D Echo, Cardiac Doppler and Color Doppler   Indications:    R06.00 SOB   History:        Patient has prior history of Echocardiogram examinations,  most recent 08/06/2022. Signs/Symptoms:Chest Pain; Risk Factors:HLD and Current Ball. Marfan syndrome.   Sonographer:    Clearence Ped RCS Referring Phys: [phone] EMILY C MONGE   IMPRESSIONS     1. Left ventricular ejection fraction, by estimation, is 55 to 60%. The left ventricle has normal function. The left ventricle has no regional wall motion abnormalities. There is mild left ventricular hypertrophy. Left ventricular diastolic parameters are indeterminate. The average left ventricular global longitudinal strain is -16.0 %. 2. Right ventricular systolic function is normal. The right ventricular size is normal. There is moderately elevated pulmonary artery systolic pressure. 3. Possible PFO/ASD Qp/QS 0.5. 4. The mitral valve is grossly normal. Trivial mitral valve regurgitation. 5. S/p bioprosthetic AVR with known restricted R/L cusps. eccentric AI. PHT 198 ms. Has holodiastolic flow reversal. Aortic valve regurgitation is moderate to severe. Moderate aortic valve stenosis. Procedure Date: 12/2005. 6. The inferior vena cava is dilated in size with <50% respiratory variability, suggesting right atrial pressure of 15 mmHg.   Conclusion(s)/Recommendation(s): Compared to prior study, AI has progressed. Based on the above findings, recommend TEE.   FINDINGS Left Ventricle: Left ventricular ejection fraction, by estimation, is 55 to 60%. The left ventricle has normal function. The left ventricle has no regional wall motion abnormalities. The average left ventricular global longitudinal strain is -16.0 %. The left ventricular internal cavity size was normal in size. There is mild left ventricular hypertrophy. Left ventricular diastolic parameters are indeterminate. The ratio of pulmonic flow to systemic flow (Qp/Qs ratio) is 0.50.   Right Ventricle: The right ventricular size is normal. Right ventricular systolic function is normal. There is moderately elevated pulmonary artery systolic pressure.  The tricuspid regurgitant velocity is 2.82 m/s, and with an assumed right atrial pressure of 15 mmHg, the estimated right ventricular systolic pressure is 46.8 mmHg.   Left Atrium: Left atrial size was normal in size.   Right Atrium: Right atrial size was normal in size.   Pericardium: There is no evidence of pericardial effusion.   Mitral Valve: The mitral valve is grossly normal. Trivial mitral valve regurgitation.   Tricuspid Valve: The tricuspid valve is normal in structure. Tricuspid valve regurgitation is mild.   Aortic Valve: S/p bioprosthetic AVR with known restricted R/L cusps. eccentric AI.  Expand All Collapse All  Cardiology Office Note:     Date:  07/07/2023    ID:  Christopher Ball, DOB December 20, 1970, MRN [id number]   PCP:  Lorre Munroe, NP              [facility] Providers Cardiologist:  Nanetta Batty, MD      Referring MD: Lorre Munroe, NP       Chief Complaint  Patient presents with   Shortness of Breath      History of Present Illness:     Christopher Ball is a 52 y.o. 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   In August of this year he developed acute onset of pain from his left arm across the chest into the right upper arm.  He went to the emergency room as he also had shortness of breath at the time.  All of his symptoms were fairly abrupt in onset.  He was found to have an elevated BNP.  A CTA of the chest, abdomen, and pelvis was performed and showed no evidence of dissection.  He remained short of breath and presented for cardiology follow-up after his ER visit.  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  A TEE demonstrated severe aortic regurgitation with an eccentric AI jet and a prolapsed leaflet.   The patient is here with his wife today.  He continues to feel poorly.  He complains of shortness of breath with low-level activity and orthopnea.  He denies chest discomfort.  He is lightheaded when he bends forward and then raises back up.  He also feels poorly when he exerts and complains of dizziness.  He has been coughing up brown sputum but this is improved since he started furosemide.  He  states that all of his symptoms have improved a little bit since starting furosemide but he continues to feel poorly overall.  The patient has regular dental care and has a few cavities but no other issues.   Current Medications: Active Medications      Current Meds  Medication Sig   atorvastatin (LIPITOR) 20 MG tablet TAKE 1 TABLET (20 MG TOTAL) BY MOUTH DAILY. KEEP OFFICE VISIT FOR FUTURE REFILLS.   clindamycin (CLEOCIN) 300 MG capsule Take 2 tablets 1 hour prior to dental appointment   fluticasone (FLONASE) 50 MCG/ACT nasal spray Place 1 spray into both nostrils daily as needed for rhinitis.   furosemide (LASIX) 40 MG tablet Take 1 tablet (40 mg total) by mouth daily.   levocetirizine (XYZAL) 5 MG tablet Take 5 mg by mouth every evening.   Multiple Vitamins-Minerals (MULTIVITAMIN WITH MINERALS) tablet Take 1 tablet by mouth daily.   tadalafil (CIALIS) 20 MG tablet Take 1 tablet (20 mg total) by mouth daily.        Allergies:   Augmentin [amoxicillin-pot clavulanate]    ROS:   Please see the history of present illness.    All other systems reviewed and are negative.   EKGs/Labs/Other Studies Reviewed:     The following studies were reviewed today: Cardiac Studies & Procedures  Expand All Collapse All  Cardiology Office Note:     Date:  07/07/2023    ID:  Christopher Ball, DOB December 20, 1970, MRN [id number]   PCP:  Lorre Munroe, NP              [facility] Providers Cardiologist:  Nanetta Batty, MD      Referring MD: Lorre Munroe, NP       Chief Complaint  Patient presents with   Shortness of Breath      History of Present Illness:     Christopher Ball is a 52 y.o. 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   In August of this year he developed acute onset of pain from his left arm across the chest into the right upper arm.  He went to the emergency room as he also had shortness of breath at the time.  All of his symptoms were fairly abrupt in onset.  He was found to have an elevated BNP.  A CTA of the chest, abdomen, and pelvis was performed and showed no evidence of dissection.  He remained short of breath and presented for cardiology follow-up after his ER visit.  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  A TEE demonstrated severe aortic regurgitation with an eccentric AI jet and a prolapsed leaflet.   The patient is here with his wife today.  He continues to feel poorly.  He complains of shortness of breath with low-level activity and orthopnea.  He denies chest discomfort.  He is lightheaded when he bends forward and then raises back up.  He also feels poorly when he exerts and complains of dizziness.  He has been coughing up brown sputum but this is improved since he started furosemide.  He  states that all of his symptoms have improved a little bit since starting furosemide but he continues to feel poorly overall.  The patient has regular dental care and has a few cavities but no other issues.   Current Medications: Active Medications      Current Meds  Medication Sig   atorvastatin (LIPITOR) 20 MG tablet TAKE 1 TABLET (20 MG TOTAL) BY MOUTH DAILY. KEEP OFFICE VISIT FOR FUTURE REFILLS.   clindamycin (CLEOCIN) 300 MG capsule Take 2 tablets 1 hour prior to dental appointment   fluticasone (FLONASE) 50 MCG/ACT nasal spray Place 1 spray into both nostrils daily as needed for rhinitis.   furosemide (LASIX) 40 MG tablet Take 1 tablet (40 mg total) by mouth daily.   levocetirizine (XYZAL) 5 MG tablet Take 5 mg by mouth every evening.   Multiple Vitamins-Minerals (MULTIVITAMIN WITH MINERALS) tablet Take 1 tablet by mouth daily.   tadalafil (CIALIS) 20 MG tablet Take 1 tablet (20 mg total) by mouth daily.        Allergies:   Augmentin [amoxicillin-pot clavulanate]    ROS:   Please see the history of present illness.    All other systems reviewed and are negative.   EKGs/Labs/Other Studies Reviewed:     The following studies were reviewed today: Cardiac Studies & Procedures  0.39 AI PHT:            226 msec   AORTA Ao Root diam: 3.50 cm Ao Asc diam:  2.40 cm   MITRAL VALVE                TRICUSPID VALVE MV Area (PHT):              TR Peak grad:   31.8 mmHg MV Decel Time:              TR Vmax:        282.00 cm/s MV E velocity: 42.40 cm/s   Estimated RAP:  3.00 mmHg MV A velocity: 107.00 cm/s  RVSP:           34.8 mmHg MV E/A ratio:  0.40 SHUNTS Systemic VTI:  0.26 m Systemic Diam: 2.00 cm Pulmonic VTI:  0.098 m Pulmonic Diam: 2.40 cm Qp/Qs:         0.55   Carolan Clines Electronically signed by Carolan Clines Signature Date/Time: 06/05/2023/11:17:43 AM       Final   TEE   ECHO TEE 06/25/2023   Narrative TRANSESOPHOGEAL ECHO REPORT       Patient Name:   Christopher Ball [facility] Date of Exam: 06/25/2023 Medical Rec #:  [id number]        Height:       78.0 in Accession #:    [id number]       Weight:       155.0 lb Date of Birth:  [birth year]        BSA:          2.026 m Patient Age:    52 years         BP:           112/36 mmHg Patient Gender: M                HR:           96 bpm. Exam Location:  Outpatient   Procedure: Transesophageal Echo,  Cardiac Doppler and Color Doppler   Indications:     aortic valve regurgitation   History:         Patient has prior history of Echocardiogram examinations, most recent 06/05/2023. CHF, Marfan's syndrome; Risk Factors:Current Ball and Dyslipidemia.   Sonographer:     Delcie Roch RDCS Referring Phys:  [id number] Petra Kuba MONGE Diagnosing Phys: Chilton Si MD   PROCEDURE: After discussion of the risks and benefits of a TEE, an informed consent was obtained from the patient. The transesophogeal probe was passed without difficulty through the esophogus of the patient. Imaged were obtained with the patient in a left lateral decubitus position. Sedation performed by different physician. The patient was monitored while under deep sedation. Anesthestetic sedation was provided intravenously by Anesthesiology: 255mg  of Propofol. The patient's vital signs; including heart rate, blood pressure, and oxygen saturation; remained stable throughout the procedure. The patient developed no complications during the procedure.   IMPRESSIONS     1. Left ventricular ejection fraction, by estimation, is 60 to 65%. The left ventricle has normal function. The left ventricle has no regional wall motion abnormalities. 2. Right ventricular systolic function is normal. The right ventricular size is normal. 3. No left atrial/left atrial appendage thrombus was detected. 4. Moderate pericardial effusion. 5. The mitral valve is normal in structure. Trivial mitral valve regurgitation. No evidence of mitral stenosis. 6. The aortic valve has been repaired/replaced. Aortic valve regurgitation is severe.

## 2023-07-10 NOTE — Consult Note (Addendum)
disease Neg Hx    Hypertension Neg Hx    Hyperlipidemia Neg Hx      ROS:  Please see the history of present illness.  All other ROS reviewed and negative.     Physical Exam/Data:   Vitals:   07/10/23 0935 07/10/23 0940 07/10/23 0945 07/10/23 1000  BP:   (!) 131/46 (!) 130/46  Pulse: 90 88 89 82  Resp: (!) 24 (!) 21 (!) 27 14  Temp:      TempSrc:      SpO2: 96% 96% 93% 96%  Weight:      Height:       No intake or output data in the 24 hours ending 07/10/23 1045    07/10/2023    6:18 AM 07/07/2023   11:28 AM 07/01/2023    9:49 AM  Last 3 Weights  Weight (lbs) 148 lb 147 lb 6.4 oz 148 lb  Weight (kg) 67.132 kg 66.86 kg 67.132 kg     Body mass index is 17.1 kg/m.  General:  tall, thin  HEENT: normal Neck: no JVD Cardiac:  normal S1, S2; RRR;  2/6 systolic and 2/6 diastolic murmur at the right upper sternal borde  Lungs:  clear to auscultation bilaterally, no wheezing, rhonchi or rales  Abd: soft, nontender, no hepatomegaly  Ext: no edema Musculoskeletal:  No deformities, BUE and  BLE strength normal and equal Skin: warm and dry  Neuro:  CNs 2-12 intact, no focal abnormalities noted Psych:  Normal affect   EKG:  The EKG was personally reviewed and demonstrates:  sinus, HR 71 Telemetry:  Telemetry was personally reviewed and demonstrates:  sinus HR in 80s  Cardiac Studies & Procedures   CARDIAC CATHETERIZATION  CARDIAC CATHETERIZATION 07/10/2023  Narrative 1.  Angiographically normal coronary arteries (right dominant) 2.  Severely elevated LVEDP 3.  Severe aortic insufficiency 4.  Moderate pulmonary hypertension with mean PA pressure 43 mmHg, RA pressure 16 mmHg, right heart pressure elevation likely secondary to left heart failure/severe AI (transpulmonary gradient 6 mmHg)  Recommendation: Hospital admission, IV diuresis, inpatient workup for valve in valve TAVR versus redo surgery  Findings Coronary Findings Diagnostic  Dominance: Right  Left Main The left main has mild ostial narrowing, typical of coronary reimplantation.  There is no pressure dampening with a catheter inside of the left main.  The vessel is angiographically normal.  Left Anterior Descending The LAD is patent to the apex.  There is no obstructive disease throughout.  The vessel supplies a large territory.  Left Circumflex The circumflex is patent throughout.  There is no stenosis.  There is a large OM branch with no stenosis.  Right Coronary Artery Large, dominant vessel.  The vessel supplies a PDA and PLA branch.  There is no stenosis throughout the RCA distribution.  Intervention  No interventions have been documented.   STRESS TESTS  NM MYOCAR MULTI W/SPECT W 04/29/2006   ECHOCARDIOGRAM  ECHOCARDIOGRAM COMPLETE 06/05/2023  Narrative ECHOCARDIOGRAM REPORT    Patient Name:   Christopher Ball [facility] Date of Exam: 06/05/2023 Medical Rec #:  [id number]        Height:       78.0 in Accession #:    [id number]       Weight:       150.6 lb Date of Birth:  December 29, 1970        BSA:           2.001 m Patient Age:    52 years  [facility]   MULTIDISCIPLINARY HEART VALVE TEAM  Cardiology Consultation:   Patient ID: Christopher Ball MRN: [id number]; DOB: [birth year]  Admit date: 07/10/2023 Date of Consult: 07/10/2023  Primary Care Provider: Lorre Munroe, NP [facility] Cardiologist: Christopher Batty, MD  [facility] Electrophysiologist:  None    Patient Profile:   Christopher Ball is a 52 y.o. male with a hx of Marfan syndrome, tobacco abuse, CKD stage II, chronic HFpEF with progressive symptoms (class III sx), s/p aortic root replacement with a Medtronic freestyle 27 mm aortic valve and ascending aortic replacement with a 24 mm Hemashield graft with coronary artery reimplantation in 2007, and now severe bioprosthetic AI with acute decompensated heart failure who is being seen today for the evaluation of severe bioprosthetic aortic insufficiency at the request of Dr. Excell Seltzer.  History of Present Illness:   Mr. Christopher Ball underwent aortic root replacement with a Medtronic freestyle 27 mm aortic valve and ascending aortic replacement with a 24 mm Hemashield graft with coronary artery reimplantation in 2007 with Dr. [name]. He refused mechanical valve with obligate long term Coumadin at the time.   In August 2024 he developed abrupt chest pain/SOB and presented to the ED. He was found to have an elevated BNP >900 and mildly elevated hstrop. A CTA of the chest, abdomen, and pelvis was performed and showed no evidence of dissection.   He was seen in cardiology follow up and complained of progressive symptoms. Cardiac CT 06/02/23 showed CAC of 0, mild HALT of prosthetic valve and mild root dilation. There was also evidence of partial anomalous pulmonary venous return with evidence of an atrial septal defect and with associated right ventricular dilation.   Follow up echo 06/05/23 showed EF 55%, mod pulm HTN, S/p bioprosthetic AVR with known restricted R/L cusps with eccentric moderate to severe AI. PHT  198 ms and holodiastolic flow reversal. There was moderate aortic valve stenosis with 27 mmHg, peak grad 44.1 mmHg, AVA 1.1 cm2, DVI 0.39.   TEE 06/25/23 showed EF 60%, moderate pericardial effusion, severe aortic regurgitation with an eccentric AI jet and a prolapsed leaflet with PHT . There was moderate AS with a mean gradient of 32 mmhg and moderate PFO with predominantly L->R shunting. Elevated aortic gradients felt to be 2/2 AI vs true aortic stenosis.   He was seen by Dr. Excell Seltzer in the office on 07/07/23. Physical exam c/w severe AS with wide pulse pressure and progressive heart failure symptoms. He was set up for [facility] today which showed normal right dominant coronary arteries, severely elevated LVEDP, severe AI and moderate pulmonary HTN with mean PA pressure 43 mmHg, RA pressure 16 mmHg, right heart pressure elevation likely secondary to left heart failure/severe AI (transpulmonary gradient 6 mmHg). He was admitted for IV diuresis, formal TAVR CT scanning and cardiothoracic surgical consultation.    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 [name] /30/07 with aortic bioprosthetic valve   Tobacco abuse     Past Surgical History:  Procedure Laterality Date   AORTIC VALVE REPLACEMENT     COLONOSCOPY WITH PROPOFOL N/A 08/19/2022   Procedure: COLONOSCOPY WITH PROPOFOL;  Surgeon: Wyline Mood, MD;  Location: [facility] ENDOSCOPY;  Service: Gastroenterology;  Laterality: N/A;   TEE WITHOUT CARDIOVERSION N/A 06/25/2023   Procedure: TRANSESOPHAGEAL ECHOCARDIOGRAM;  Surgeon: Chilton Si, MD;  Location: [facility] INVASIVE CV LAB;  [facility]   MULTIDISCIPLINARY HEART VALVE TEAM  Cardiology Consultation:   Patient ID: Christopher Ball MRN: [id number]; DOB: [birth year]  Admit date: 07/10/2023 Date of Consult: 07/10/2023  Primary Care Provider: Lorre Munroe, NP [facility] Cardiologist: Christopher Batty, MD  [facility] Electrophysiologist:  None    Patient Profile:   Christopher Ball is a 52 y.o. male with a hx of Marfan syndrome, tobacco abuse, CKD stage II, chronic HFpEF with progressive symptoms (class III sx), s/p aortic root replacement with a Medtronic freestyle 27 mm aortic valve and ascending aortic replacement with a 24 mm Hemashield graft with coronary artery reimplantation in 2007, and now severe bioprosthetic AI with acute decompensated heart failure who is being seen today for the evaluation of severe bioprosthetic aortic insufficiency at the request of Dr. Excell Seltzer.  History of Present Illness:   Mr. Christopher Ball underwent aortic root replacement with a Medtronic freestyle 27 mm aortic valve and ascending aortic replacement with a 24 mm Hemashield graft with coronary artery reimplantation in 2007 with Dr. [name]. He refused mechanical valve with obligate long term Coumadin at the time.   In August 2024 he developed abrupt chest pain/SOB and presented to the ED. He was found to have an elevated BNP >900 and mildly elevated hstrop. A CTA of the chest, abdomen, and pelvis was performed and showed no evidence of dissection.   He was seen in cardiology follow up and complained of progressive symptoms. Cardiac CT 06/02/23 showed CAC of 0, mild HALT of prosthetic valve and mild root dilation. There was also evidence of partial anomalous pulmonary venous return with evidence of an atrial septal defect and with associated right ventricular dilation.   Follow up echo 06/05/23 showed EF 55%, mod pulm HTN, S/p bioprosthetic AVR with known restricted R/L cusps with eccentric moderate to severe AI. PHT  198 ms and holodiastolic flow reversal. There was moderate aortic valve stenosis with 27 mmHg, peak grad 44.1 mmHg, AVA 1.1 cm2, DVI 0.39.   TEE 06/25/23 showed EF 60%, moderate pericardial effusion, severe aortic regurgitation with an eccentric AI jet and a prolapsed leaflet with PHT . There was moderate AS with a mean gradient of 32 mmhg and moderate PFO with predominantly L->R shunting. Elevated aortic gradients felt to be 2/2 AI vs true aortic stenosis.   He was seen by Dr. Excell Seltzer in the office on 07/07/23. Physical exam c/w severe AS with wide pulse pressure and progressive heart failure symptoms. He was set up for [facility] today which showed normal right dominant coronary arteries, severely elevated LVEDP, severe AI and moderate pulmonary HTN with mean PA pressure 43 mmHg, RA pressure 16 mmHg, right heart pressure elevation likely secondary to left heart failure/severe AI (transpulmonary gradient 6 mmHg). He was admitted for IV diuresis, formal TAVR CT scanning and cardiothoracic surgical consultation.    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 [name] /30/07 with aortic bioprosthetic valve   Tobacco abuse     Past Surgical History:  Procedure Laterality Date   AORTIC VALVE REPLACEMENT     COLONOSCOPY WITH PROPOFOL N/A 08/19/2022   Procedure: COLONOSCOPY WITH PROPOFOL;  Surgeon: Wyline Mood, MD;  Location: [facility] ENDOSCOPY;  Service: Gastroenterology;  Laterality: N/A;   TEE WITHOUT CARDIOVERSION N/A 06/25/2023   Procedure: TRANSESOPHAGEAL ECHOCARDIOGRAM;  Surgeon: Chilton Si, MD;  Location: [facility] INVASIVE CV LAB;  m Pulmonic Diam: 2.40 cm Qp/Qs:         0.55  Carolan Clines Electronically signed by Carolan Clines Signature Date/Time: 06/05/2023/11:17:43 AM    Final   TEE  ECHO TEE 06/25/2023  Narrative TRANSESOPHOGEAL ECHO REPORT    Patient Name:   Christopher Ball [facility] Date of Exam: 06/25/2023 Medical Rec #:  [id number]        Height:       78.0  in Accession #:    [id number]       Weight:       155.0 lb Date of Birth:  [birth year]        BSA:          2.026 m Patient Age:    52 years         BP:           112/36 mmHg Patient Gender: M                HR:           96 bpm. Exam Location:  Outpatient  Procedure: Transesophageal Echo, Cardiac Doppler and Color Doppler  Indications:     aortic valve regurgitation  History:         Patient has prior history of Echocardiogram examinations, most recent 06/05/2023. CHF, Marfan's syndrome; Risk Factors:Current Smoker and Dyslipidemia.  Sonographer:     Delcie Roch RDCS Referring Phys:  [id number] Petra Kuba MONGE Diagnosing Phys: Chilton Si MD  PROCEDURE: After discussion of the risks and benefits of a TEE, an informed consent was obtained from the patient. The transesophogeal probe was passed without difficulty through the esophogus of the patient. Imaged were obtained with the patient in a left lateral decubitus position. Sedation performed by different physician. The patient was monitored while under deep sedation. Anesthestetic sedation was provided intravenously by Anesthesiology: 255mg  of Propofol. The patient's vital signs; including heart rate, blood pressure, and oxygen saturation; remained stable throughout the procedure. The patient developed no complications during the procedure.  IMPRESSIONS   1. Left ventricular ejection fraction, by estimation, is 60 to 65%. The left ventricle has normal function. The left ventricle has no regional wall motion abnormalities. 2. Right ventricular systolic function is normal. The right ventricular size is normal. 3. No left atrial/left atrial appendage thrombus was detected. 4. Moderate pericardial effusion. 5. The mitral valve is normal in structure. Trivial mitral valve regurgitation. No evidence of mitral stenosis. 6. The aortic valve has been repaired/replaced. Aortic valve regurgitation is severe. Moderate aortic valve stenosis.  Aortic regurgitation PHT measures 115 msec. Aortic valve area, by VTI measures 1.56 cm. Aortic valve mean gradient measures 32.0 mmHg. Aortic valve Vmax measures 3.92 m/s. 7. Aortic root/ascending aorta has been repaired/replaced. 8. The inferior vena cava is normal in size with greater than 50% respiratory variability, suggesting right atrial pressure of 3 mmHg. 9. There is a moderately sized patent foramen ovale with predominantly left to right shunting across the atrial septum.  FINDINGS Left Ventricle: Left ventricular ejection fraction, by estimation, is 60 to 65%. The left ventricle has normal function. The left ventricle has no regional wall motion abnormalities. The left ventricular internal cavity size was normal in size. There is no left ventricular hypertrophy.  Right Ventricle: The right ventricular size is normal. No increase in right ventricular wall thickness. Right ventricular systolic function is normal.  Left Atrium: Left atrial size was normal in size. No left atrial/left atrial appendage thrombus was detected.  Right Atrium:  [facility]   MULTIDISCIPLINARY HEART VALVE TEAM  Cardiology Consultation:   Patient ID: Christopher Ball MRN: [id number]; DOB: [birth year]  Admit date: 07/10/2023 Date of Consult: 07/10/2023  Primary Care Provider: Lorre Munroe, NP [facility] Cardiologist: Christopher Batty, MD  [facility] Electrophysiologist:  None    Patient Profile:   Christopher Ball is a 52 y.o. male with a hx of Marfan syndrome, tobacco abuse, CKD stage II, chronic HFpEF with progressive symptoms (class III sx), s/p aortic root replacement with a Medtronic freestyle 27 mm aortic valve and ascending aortic replacement with a 24 mm Hemashield graft with coronary artery reimplantation in 2007, and now severe bioprosthetic AI with acute decompensated heart failure who is being seen today for the evaluation of severe bioprosthetic aortic insufficiency at the request of Dr. Excell Seltzer.  History of Present Illness:   Mr. Christopher Ball underwent aortic root replacement with a Medtronic freestyle 27 mm aortic valve and ascending aortic replacement with a 24 mm Hemashield graft with coronary artery reimplantation in 2007 with Dr. [name]. He refused mechanical valve with obligate long term Coumadin at the time.   In August 2024 he developed abrupt chest pain/SOB and presented to the ED. He was found to have an elevated BNP >900 and mildly elevated hstrop. A CTA of the chest, abdomen, and pelvis was performed and showed no evidence of dissection.   He was seen in cardiology follow up and complained of progressive symptoms. Cardiac CT 06/02/23 showed CAC of 0, mild HALT of prosthetic valve and mild root dilation. There was also evidence of partial anomalous pulmonary venous return with evidence of an atrial septal defect and with associated right ventricular dilation.   Follow up echo 06/05/23 showed EF 55%, mod pulm HTN, S/p bioprosthetic AVR with known restricted R/L cusps with eccentric moderate to severe AI. PHT  198 ms and holodiastolic flow reversal. There was moderate aortic valve stenosis with 27 mmHg, peak grad 44.1 mmHg, AVA 1.1 cm2, DVI 0.39.   TEE 06/25/23 showed EF 60%, moderate pericardial effusion, severe aortic regurgitation with an eccentric AI jet and a prolapsed leaflet with PHT . There was moderate AS with a mean gradient of 32 mmhg and moderate PFO with predominantly L->R shunting. Elevated aortic gradients felt to be 2/2 AI vs true aortic stenosis.   He was seen by Dr. Excell Seltzer in the office on 07/07/23. Physical exam c/w severe AS with wide pulse pressure and progressive heart failure symptoms. He was set up for [facility] today which showed normal right dominant coronary arteries, severely elevated LVEDP, severe AI and moderate pulmonary HTN with mean PA pressure 43 mmHg, RA pressure 16 mmHg, right heart pressure elevation likely secondary to left heart failure/severe AI (transpulmonary gradient 6 mmHg). He was admitted for IV diuresis, formal TAVR CT scanning and cardiothoracic surgical consultation.    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 [name] /30/07 with aortic bioprosthetic valve   Tobacco abuse     Past Surgical History:  Procedure Laterality Date   AORTIC VALVE REPLACEMENT     COLONOSCOPY WITH PROPOFOL N/A 08/19/2022   Procedure: COLONOSCOPY WITH PROPOFOL;  Surgeon: Wyline Mood, MD;  Location: [facility] ENDOSCOPY;  Service: Gastroenterology;  Laterality: N/A;   TEE WITHOUT CARDIOVERSION N/A 06/25/2023   Procedure: TRANSESOPHAGEAL ECHOCARDIOGRAM;  Surgeon: Chilton Si, MD;  Location: [facility] INVASIVE CV LAB;  m Pulmonic Diam: 2.40 cm Qp/Qs:         0.55  Carolan Clines Electronically signed by Carolan Clines Signature Date/Time: 06/05/2023/11:17:43 AM    Final   TEE  ECHO TEE 06/25/2023  Narrative TRANSESOPHOGEAL ECHO REPORT    Patient Name:   Christopher Ball [facility] Date of Exam: 06/25/2023 Medical Rec #:  [id number]        Height:       78.0  in Accession #:    [id number]       Weight:       155.0 lb Date of Birth:  [birth year]        BSA:          2.026 m Patient Age:    52 years         BP:           112/36 mmHg Patient Gender: M                HR:           96 bpm. Exam Location:  Outpatient  Procedure: Transesophageal Echo, Cardiac Doppler and Color Doppler  Indications:     aortic valve regurgitation  History:         Patient has prior history of Echocardiogram examinations, most recent 06/05/2023. CHF, Marfan's syndrome; Risk Factors:Current Smoker and Dyslipidemia.  Sonographer:     Delcie Roch RDCS Referring Phys:  [id number] Petra Kuba MONGE Diagnosing Phys: Chilton Si MD  PROCEDURE: After discussion of the risks and benefits of a TEE, an informed consent was obtained from the patient. The transesophogeal probe was passed without difficulty through the esophogus of the patient. Imaged were obtained with the patient in a left lateral decubitus position. Sedation performed by different physician. The patient was monitored while under deep sedation. Anesthestetic sedation was provided intravenously by Anesthesiology: 255mg  of Propofol. The patient's vital signs; including heart rate, blood pressure, and oxygen saturation; remained stable throughout the procedure. The patient developed no complications during the procedure.  IMPRESSIONS   1. Left ventricular ejection fraction, by estimation, is 60 to 65%. The left ventricle has normal function. The left ventricle has no regional wall motion abnormalities. 2. Right ventricular systolic function is normal. The right ventricular size is normal. 3. No left atrial/left atrial appendage thrombus was detected. 4. Moderate pericardial effusion. 5. The mitral valve is normal in structure. Trivial mitral valve regurgitation. No evidence of mitral stenosis. 6. The aortic valve has been repaired/replaced. Aortic valve regurgitation is severe. Moderate aortic valve stenosis.  Aortic regurgitation PHT measures 115 msec. Aortic valve area, by VTI measures 1.56 cm. Aortic valve mean gradient measures 32.0 mmHg. Aortic valve Vmax measures 3.92 m/s. 7. Aortic root/ascending aorta has been repaired/replaced. 8. The inferior vena cava is normal in size with greater than 50% respiratory variability, suggesting right atrial pressure of 3 mmHg. 9. There is a moderately sized patent foramen ovale with predominantly left to right shunting across the atrial septum.  FINDINGS Left Ventricle: Left ventricular ejection fraction, by estimation, is 60 to 65%. The left ventricle has normal function. The left ventricle has no regional wall motion abnormalities. The left ventricular internal cavity size was normal in size. There is no left ventricular hypertrophy.  Right Ventricle: The right ventricular size is normal. No increase in right ventricular wall thickness. Right ventricular systolic function is normal.  Left Atrium: Left atrial size was normal in size. No left atrial/left atrial appendage thrombus was detected.  Right Atrium:  m Pulmonic Diam: 2.40 cm Qp/Qs:         0.55  Carolan Clines Electronically signed by Carolan Clines Signature Date/Time: 06/05/2023/11:17:43 AM    Final   TEE  ECHO TEE 06/25/2023  Narrative TRANSESOPHOGEAL ECHO REPORT    Patient Name:   Christopher Ball [facility] Date of Exam: 06/25/2023 Medical Rec #:  [id number]        Height:       78.0  in Accession #:    [id number]       Weight:       155.0 lb Date of Birth:  [birth year]        BSA:          2.026 m Patient Age:    52 years         BP:           112/36 mmHg Patient Gender: M                HR:           96 bpm. Exam Location:  Outpatient  Procedure: Transesophageal Echo, Cardiac Doppler and Color Doppler  Indications:     aortic valve regurgitation  History:         Patient has prior history of Echocardiogram examinations, most recent 06/05/2023. CHF, Marfan's syndrome; Risk Factors:Current Smoker and Dyslipidemia.  Sonographer:     Delcie Roch RDCS Referring Phys:  [id number] Petra Kuba MONGE Diagnosing Phys: Chilton Si MD  PROCEDURE: After discussion of the risks and benefits of a TEE, an informed consent was obtained from the patient. The transesophogeal probe was passed without difficulty through the esophogus of the patient. Imaged were obtained with the patient in a left lateral decubitus position. Sedation performed by different physician. The patient was monitored while under deep sedation. Anesthestetic sedation was provided intravenously by Anesthesiology: 255mg  of Propofol. The patient's vital signs; including heart rate, blood pressure, and oxygen saturation; remained stable throughout the procedure. The patient developed no complications during the procedure.  IMPRESSIONS   1. Left ventricular ejection fraction, by estimation, is 60 to 65%. The left ventricle has normal function. The left ventricle has no regional wall motion abnormalities. 2. Right ventricular systolic function is normal. The right ventricular size is normal. 3. No left atrial/left atrial appendage thrombus was detected. 4. Moderate pericardial effusion. 5. The mitral valve is normal in structure. Trivial mitral valve regurgitation. No evidence of mitral stenosis. 6. The aortic valve has been repaired/replaced. Aortic valve regurgitation is severe. Moderate aortic valve stenosis.  Aortic regurgitation PHT measures 115 msec. Aortic valve area, by VTI measures 1.56 cm. Aortic valve mean gradient measures 32.0 mmHg. Aortic valve Vmax measures 3.92 m/s. 7. Aortic root/ascending aorta has been repaired/replaced. 8. The inferior vena cava is normal in size with greater than 50% respiratory variability, suggesting right atrial pressure of 3 mmHg. 9. There is a moderately sized patent foramen ovale with predominantly left to right shunting across the atrial septum.  FINDINGS Left Ventricle: Left ventricular ejection fraction, by estimation, is 60 to 65%. The left ventricle has normal function. The left ventricle has no regional wall motion abnormalities. The left ventricular internal cavity size was normal in size. There is no left ventricular hypertrophy.  Right Ventricle: The right ventricular size is normal. No increase in right ventricular wall thickness. Right ventricular systolic function is normal.  Left Atrium: Left atrial size was normal in size. No left atrial/left atrial appendage thrombus was detected.  Right Atrium:  m Pulmonic Diam: 2.40 cm Qp/Qs:         0.55  Carolan Clines Electronically signed by Carolan Clines Signature Date/Time: 06/05/2023/11:17:43 AM    Final   TEE  ECHO TEE 06/25/2023  Narrative TRANSESOPHOGEAL ECHO REPORT    Patient Name:   Christopher Ball [facility] Date of Exam: 06/25/2023 Medical Rec #:  [id number]        Height:       78.0  in Accession #:    [id number]       Weight:       155.0 lb Date of Birth:  [birth year]        BSA:          2.026 m Patient Age:    52 years         BP:           112/36 mmHg Patient Gender: M                HR:           96 bpm. Exam Location:  Outpatient  Procedure: Transesophageal Echo, Cardiac Doppler and Color Doppler  Indications:     aortic valve regurgitation  History:         Patient has prior history of Echocardiogram examinations, most recent 06/05/2023. CHF, Marfan's syndrome; Risk Factors:Current Smoker and Dyslipidemia.  Sonographer:     Delcie Roch RDCS Referring Phys:  [id number] Petra Kuba MONGE Diagnosing Phys: Chilton Si MD  PROCEDURE: After discussion of the risks and benefits of a TEE, an informed consent was obtained from the patient. The transesophogeal probe was passed without difficulty through the esophogus of the patient. Imaged were obtained with the patient in a left lateral decubitus position. Sedation performed by different physician. The patient was monitored while under deep sedation. Anesthestetic sedation was provided intravenously by Anesthesiology: 255mg  of Propofol. The patient's vital signs; including heart rate, blood pressure, and oxygen saturation; remained stable throughout the procedure. The patient developed no complications during the procedure.  IMPRESSIONS   1. Left ventricular ejection fraction, by estimation, is 60 to 65%. The left ventricle has normal function. The left ventricle has no regional wall motion abnormalities. 2. Right ventricular systolic function is normal. The right ventricular size is normal. 3. No left atrial/left atrial appendage thrombus was detected. 4. Moderate pericardial effusion. 5. The mitral valve is normal in structure. Trivial mitral valve regurgitation. No evidence of mitral stenosis. 6. The aortic valve has been repaired/replaced. Aortic valve regurgitation is severe. Moderate aortic valve stenosis.  Aortic regurgitation PHT measures 115 msec. Aortic valve area, by VTI measures 1.56 cm. Aortic valve mean gradient measures 32.0 mmHg. Aortic valve Vmax measures 3.92 m/s. 7. Aortic root/ascending aorta has been repaired/replaced. 8. The inferior vena cava is normal in size with greater than 50% respiratory variability, suggesting right atrial pressure of 3 mmHg. 9. There is a moderately sized patent foramen ovale with predominantly left to right shunting across the atrial septum.  FINDINGS Left Ventricle: Left ventricular ejection fraction, by estimation, is 60 to 65%. The left ventricle has normal function. The left ventricle has no regional wall motion abnormalities. The left ventricular internal cavity size was normal in size. There is no left ventricular hypertrophy.  Right Ventricle: The right ventricular size is normal. No increase in right ventricular wall thickness. Right ventricular systolic function is normal.  Left Atrium: Left atrial size was normal in size. No left atrial/left atrial appendage thrombus was detected.  Right Atrium:  m Pulmonic Diam: 2.40 cm Qp/Qs:         0.55  Carolan Clines Electronically signed by Carolan Clines Signature Date/Time: 06/05/2023/11:17:43 AM    Final   TEE  ECHO TEE 06/25/2023  Narrative TRANSESOPHOGEAL ECHO REPORT    Patient Name:   Christopher Ball [facility] Date of Exam: 06/25/2023 Medical Rec #:  [id number]        Height:       78.0  in Accession #:    [id number]       Weight:       155.0 lb Date of Birth:  [birth year]        BSA:          2.026 m Patient Age:    52 years         BP:           112/36 mmHg Patient Gender: M                HR:           96 bpm. Exam Location:  Outpatient  Procedure: Transesophageal Echo, Cardiac Doppler and Color Doppler  Indications:     aortic valve regurgitation  History:         Patient has prior history of Echocardiogram examinations, most recent 06/05/2023. CHF, Marfan's syndrome; Risk Factors:Current Smoker and Dyslipidemia.  Sonographer:     Delcie Roch RDCS Referring Phys:  [id number] Petra Kuba MONGE Diagnosing Phys: Chilton Si MD  PROCEDURE: After discussion of the risks and benefits of a TEE, an informed consent was obtained from the patient. The transesophogeal probe was passed without difficulty through the esophogus of the patient. Imaged were obtained with the patient in a left lateral decubitus position. Sedation performed by different physician. The patient was monitored while under deep sedation. Anesthestetic sedation was provided intravenously by Anesthesiology: 255mg  of Propofol. The patient's vital signs; including heart rate, blood pressure, and oxygen saturation; remained stable throughout the procedure. The patient developed no complications during the procedure.  IMPRESSIONS   1. Left ventricular ejection fraction, by estimation, is 60 to 65%. The left ventricle has normal function. The left ventricle has no regional wall motion abnormalities. 2. Right ventricular systolic function is normal. The right ventricular size is normal. 3. No left atrial/left atrial appendage thrombus was detected. 4. Moderate pericardial effusion. 5. The mitral valve is normal in structure. Trivial mitral valve regurgitation. No evidence of mitral stenosis. 6. The aortic valve has been repaired/replaced. Aortic valve regurgitation is severe. Moderate aortic valve stenosis.  Aortic regurgitation PHT measures 115 msec. Aortic valve area, by VTI measures 1.56 cm. Aortic valve mean gradient measures 32.0 mmHg. Aortic valve Vmax measures 3.92 m/s. 7. Aortic root/ascending aorta has been repaired/replaced. 8. The inferior vena cava is normal in size with greater than 50% respiratory variability, suggesting right atrial pressure of 3 mmHg. 9. There is a moderately sized patent foramen ovale with predominantly left to right shunting across the atrial septum.  FINDINGS Left Ventricle: Left ventricular ejection fraction, by estimation, is 60 to 65%. The left ventricle has normal function. The left ventricle has no regional wall motion abnormalities. The left ventricular internal cavity size was normal in size. There is no left ventricular hypertrophy.  Right Ventricle: The right ventricular size is normal. No increase in right ventricular wall thickness. Right ventricular systolic function is normal.  Left Atrium: Left atrial size was normal in size. No left atrial/left atrial appendage thrombus was detected.  Right Atrium:  disease Neg Hx    Hypertension Neg Hx    Hyperlipidemia Neg Hx      ROS:  Please see the history of present illness.  All other ROS reviewed and negative.     Physical Exam/Data:   Vitals:   07/10/23 0935 07/10/23 0940 07/10/23 0945 07/10/23 1000  BP:   (!) 131/46 (!) 130/46  Pulse: 90 88 89 82  Resp: (!) 24 (!) 21 (!) 27 14  Temp:      TempSrc:      SpO2: 96% 96% 93% 96%  Weight:      Height:       No intake or output data in the 24 hours ending 07/10/23 1045    07/10/2023    6:18 AM 07/07/2023   11:28 AM 07/01/2023    9:49 AM  Last 3 Weights  Weight (lbs) 148 lb 147 lb 6.4 oz 148 lb  Weight (kg) 67.132 kg 66.86 kg 67.132 kg     Body mass index is 17.1 kg/m.  General:  tall, thin  HEENT: normal Neck: no JVD Cardiac:  normal S1, S2; RRR;  2/6 systolic and 2/6 diastolic murmur at the right upper sternal borde  Lungs:  clear to auscultation bilaterally, no wheezing, rhonchi or rales  Abd: soft, nontender, no hepatomegaly  Ext: no edema Musculoskeletal:  No deformities, BUE and  BLE strength normal and equal Skin: warm and dry  Neuro:  CNs 2-12 intact, no focal abnormalities noted Psych:  Normal affect   EKG:  The EKG was personally reviewed and demonstrates:  sinus, HR 71 Telemetry:  Telemetry was personally reviewed and demonstrates:  sinus HR in 80s  Cardiac Studies & Procedures   CARDIAC CATHETERIZATION  CARDIAC CATHETERIZATION 07/10/2023  Narrative 1.  Angiographically normal coronary arteries (right dominant) 2.  Severely elevated LVEDP 3.  Severe aortic insufficiency 4.  Moderate pulmonary hypertension with mean PA pressure 43 mmHg, RA pressure 16 mmHg, right heart pressure elevation likely secondary to left heart failure/severe AI (transpulmonary gradient 6 mmHg)  Recommendation: Hospital admission, IV diuresis, inpatient workup for valve in valve TAVR versus redo surgery  Findings Coronary Findings Diagnostic  Dominance: Right  Left Main The left main has mild ostial narrowing, typical of coronary reimplantation.  There is no pressure dampening with a catheter inside of the left main.  The vessel is angiographically normal.  Left Anterior Descending The LAD is patent to the apex.  There is no obstructive disease throughout.  The vessel supplies a large territory.  Left Circumflex The circumflex is patent throughout.  There is no stenosis.  There is a large OM branch with no stenosis.  Right Coronary Artery Large, dominant vessel.  The vessel supplies a PDA and PLA branch.  There is no stenosis throughout the RCA distribution.  Intervention  No interventions have been documented.   STRESS TESTS  NM MYOCAR MULTI W/SPECT W 04/29/2006   ECHOCARDIOGRAM  ECHOCARDIOGRAM COMPLETE 06/05/2023  Narrative ECHOCARDIOGRAM REPORT    Patient Name:   Christopher Ball [facility] Date of Exam: 06/05/2023 Medical Rec #:  [id number]        Height:       78.0 in Accession #:    [id number]       Weight:       150.6 lb Date of Birth:  December 29, 1970        BSA:           2.001 m Patient Age:    52 years

## 2023-07-11 ENCOUNTER — Inpatient Hospital Stay (HOSPITAL_COMMUNITY): Payer: Federal, State, Local not specified - PPO

## 2023-07-11 ENCOUNTER — Other Ambulatory Visit: Payer: Self-pay

## 2023-07-11 ENCOUNTER — Ambulatory Visit: Payer: Federal, State, Local not specified - PPO | Admitting: Cardiovascular Disease

## 2023-07-11 ENCOUNTER — Encounter (HOSPITAL_COMMUNITY): Payer: Self-pay | Admitting: Cardiovascular Disease

## 2023-07-11 DIAGNOSIS — J9811 Atelectasis: Secondary | ICD-10-CM | POA: Diagnosis not present

## 2023-07-11 DIAGNOSIS — R570 Cardiogenic shock: Secondary | ICD-10-CM

## 2023-07-11 DIAGNOSIS — I5033 Acute on chronic diastolic (congestive) heart failure: Secondary | ICD-10-CM

## 2023-07-11 DIAGNOSIS — I350 Nonrheumatic aortic (valve) stenosis: Secondary | ICD-10-CM | POA: Diagnosis not present

## 2023-07-11 DIAGNOSIS — I5043 Acute on chronic combined systolic (congestive) and diastolic (congestive) heart failure: Secondary | ICD-10-CM | POA: Diagnosis not present

## 2023-07-11 DIAGNOSIS — N179 Acute kidney failure, unspecified: Secondary | ICD-10-CM | POA: Diagnosis not present

## 2023-07-11 DIAGNOSIS — T82897A Other specified complication of cardiac prosthetic devices, implants and grafts, initial encounter: Secondary | ICD-10-CM | POA: Diagnosis not present

## 2023-07-11 DIAGNOSIS — Z006 Encounter for examination for normal comparison and control in clinical research program: Secondary | ICD-10-CM | POA: Diagnosis not present

## 2023-07-11 DIAGNOSIS — R0602 Shortness of breath: Secondary | ICD-10-CM | POA: Diagnosis not present

## 2023-07-11 DIAGNOSIS — N170 Acute kidney failure with tubular necrosis: Secondary | ICD-10-CM | POA: Diagnosis not present

## 2023-07-11 DIAGNOSIS — R748 Abnormal levels of other serum enzymes: Secondary | ICD-10-CM | POA: Diagnosis not present

## 2023-07-11 DIAGNOSIS — I351 Nonrheumatic aortic (valve) insufficiency: Secondary | ICD-10-CM | POA: Diagnosis not present

## 2023-07-11 DIAGNOSIS — K7689 Other specified diseases of liver: Secondary | ICD-10-CM | POA: Diagnosis not present

## 2023-07-11 DIAGNOSIS — J90 Pleural effusion, not elsewhere classified: Secondary | ICD-10-CM | POA: Diagnosis not present

## 2023-07-11 DIAGNOSIS — K828 Other specified diseases of gallbladder: Secondary | ICD-10-CM | POA: Diagnosis not present

## 2023-07-11 LAB — HEPATITIS PANEL, ACUTE
HCV Ab: NONREACTIVE
Hep A IgM: NONREACTIVE
Hep B C IgM: NONREACTIVE
Hepatitis B Surface Ag: NONREACTIVE

## 2023-07-11 LAB — COMPREHENSIVE METABOLIC PANEL
ALT: 157 U/L — ABNORMAL HIGH (ref 0–44)
ALT: 537 U/L — ABNORMAL HIGH (ref 0–44)
AST: 180 U/L — ABNORMAL HIGH (ref 15–41)
AST: 552 U/L — ABNORMAL HIGH (ref 15–41)
Albumin: 3.4 g/dL — ABNORMAL LOW (ref 3.5–5.0)
Albumin: 3.2 g/dL — ABNORMAL LOW (ref 3.5–5.0)
Alkaline Phosphatase: 109 U/L (ref 38–126)
Alkaline Phosphatase: 100 U/L (ref 38–126)
Anion gap: 11 (ref 5–15)
Anion gap: 16 — ABNORMAL HIGH (ref 5–15)
BUN: 55 mg/dL — ABNORMAL HIGH (ref 6–20)
BUN: 67 mg/dL — ABNORMAL HIGH (ref 6–20)
CO2: 22 mmol/L (ref 22–32)
CO2: 15 mmol/L — ABNORMAL LOW (ref 22–32)
Calcium: 9.2 mg/dL (ref 8.9–10.3)
Calcium: 8.8 mg/dL — ABNORMAL LOW (ref 8.9–10.3)
Chloride: 105 mmol/L (ref 98–111)
Chloride: 105 mmol/L (ref 98–111)
Creatinine, Ser: 2.35 mg/dL — ABNORMAL HIGH (ref 0.61–1.24)
Creatinine, Ser: 3.26 mg/dL — ABNORMAL HIGH (ref 0.61–1.24)
GFR, Estimated: 33 mL/min — ABNORMAL LOW (ref >60)
GFR, Estimated: 22 mL/min — ABNORMAL LOW (ref >60)
Glucose, Bld: 130 mg/dL — ABNORMAL HIGH (ref 70–99)
Glucose, Bld: 163 mg/dL — ABNORMAL HIGH (ref 70–99)
Potassium: 5 mmol/L (ref 3.5–5.1)
Potassium: 5.4 mmol/L — ABNORMAL HIGH (ref 3.5–5.1)
Sodium: 138 mmol/L (ref 135–145)
Sodium: 136 mmol/L (ref 135–145)
Total Bilirubin: 1.5 mg/dL — ABNORMAL HIGH (ref 0.3–1.2)
Total Bilirubin: 1.9 mg/dL — ABNORMAL HIGH (ref 0.3–1.2)
Total Protein: 6.6 g/dL (ref 6.5–8.1)
Total Protein: 6.1 g/dL — ABNORMAL LOW (ref 6.5–8.1)

## 2023-07-11 LAB — COOXEMETRY PANEL
Carboxyhemoglobin: 1.1 % (ref 0.5–1.5)
Carboxyhemoglobin: 1.6 % — ABNORMAL HIGH (ref 0.5–1.5)
Methemoglobin: <0.7 % (ref 0.0–1.5)
Methemoglobin: <0.7 % (ref 0.0–1.5)
O2 Saturation: 40 %
O2 Saturation: 52.2 %
Total hemoglobin: 13.4 g/dL (ref 12.0–16.0)
Total hemoglobin: 12.7 g/dL (ref 12.0–16.0)

## 2023-07-11 LAB — CBC
HCT: 41 % (ref 39.0–52.0)
Hemoglobin: 13.3 g/dL (ref 13.0–17.0)
MCH: 31.4 pg (ref 26.0–34.0)
MCHC: 32.4 g/dL (ref 30.0–36.0)
MCV: 96.9 fL (ref 80.0–100.0)
Platelets: 197 10*3/uL (ref 150–400)
RBC: 4.23 MIL/uL (ref 4.22–5.81)
RDW: 13.3 % (ref 11.5–15.5)
WBC: 12.3 10*3/uL — ABNORMAL HIGH (ref 4.0–10.5)
nRBC: 0 % (ref 0.0–0.2)

## 2023-07-11 LAB — LACTIC ACID, PLASMA
Lactic Acid, Venous: 4.5 mmol/L (ref 0.5–1.9)
Lactic Acid, Venous: 3.5 mmol/L (ref 0.5–1.9)
Lactic Acid, Venous: 2.5 mmol/L (ref 0.5–1.9)

## 2023-07-11 LAB — ECHOCARDIOGRAM LIMITED
AV Mean grad: 26 mm[Hg]
AV Peak grad: 45.4 mm[Hg]
Ao pk vel: 3.37 m/s
Area-P 1/2: 6.17 cm2
Height: 78 in
P 1/2 time: 135 ms
S' Lateral: 4.7 cm
Weight: 2334.4 [oz_av]

## 2023-07-11 LAB — SURGICAL PCR SCREEN
MRSA, PCR: NEGATIVE
Staphylococcus aureus: POSITIVE — AB

## 2023-07-11 LAB — CREATININE, SERUM
Creatinine, Ser: 3.17 mg/dL — ABNORMAL HIGH (ref 0.61–1.24)
GFR, Estimated: 23 mL/min — ABNORMAL LOW (ref >60)

## 2023-07-11 MED ORDER — SODIUM CHLORIDE 0.9 % IV SOLN: 250.0000 mL | INTRAVENOUS | Status: DC

## 2023-07-11 MED ORDER — ACETAMINOPHEN 325 MG PO TABS: 650.0000 mg | ORAL_TABLET | Freq: Four times a day (QID) | ORAL | Status: DC | PRN

## 2023-07-11 MED ORDER — METOPROLOL TARTRATE 50 MG PO TABS
50.0000 mg | ORAL_TABLET | Freq: Once | ORAL | Status: AC
  Administered 2023-07-11: 50 mg via ORAL
  Filled 2023-07-11: qty 1

## 2023-07-11 MED ORDER — NOREPINEPHRINE 4 MG/250ML-% IV SOLN
2.0000 ug/min | INTRAVENOUS | Status: DC
  Administered 2023-07-11: 2 ug/min via INTRAVENOUS
  Filled 2023-07-11: qty 250

## 2023-07-11 MED ORDER — CHLORHEXIDINE GLUCONATE CLOTH 2 % EX PADS
6.0000 | MEDICATED_PAD | Freq: Every day | CUTANEOUS | Status: DC
  Administered 2023-07-11 – 2023-07-16 (×5): 6 via TOPICAL

## 2023-07-11 MED ORDER — SODIUM CHLORIDE 0.9% FLUSH: 10.0000 mL | INTRAVENOUS | Status: DC | PRN

## 2023-07-11 MED ORDER — SODIUM CHLORIDE 0.9% FLUSH
10.0000 mL | Freq: Two times a day (BID) | INTRAVENOUS | Status: DC
  Administered 2023-07-11: 10 mL

## 2023-07-11 MED ORDER — HEPARIN SODIUM (PORCINE) 5000 UNIT/ML IJ SOLN
5000.0000 [IU] | Freq: Three times a day (TID) | INTRAMUSCULAR | Status: DC
  Administered 2023-07-11 – 2023-07-14 (×9): 5000 [IU] via SUBCUTANEOUS
  Filled 2023-07-11 (×9): qty 1

## 2023-07-11 MED ORDER — FUROSEMIDE 10 MG/ML IJ SOLN
20.0000 mg/h | INTRAVENOUS | Status: DC
  Administered 2023-07-11 – 2023-07-13 (×4): 20 mg/h via INTRAVENOUS
  Filled 2023-07-11 (×5): qty 20

## 2023-07-11 MED ORDER — OXYCODONE HCL 5 MG PO TABS
5.0000 mg | ORAL_TABLET | ORAL | Status: DC | PRN
  Administered 2023-07-11: 5 mg via ORAL
  Filled 2023-07-11 (×3): qty 1

## 2023-07-11 MED ORDER — NOREPINEPHRINE 4 MG/250ML-% IV SOLN
0.0000 ug/min | INTRAVENOUS | Status: DC
  Administered 2023-07-11: 5 ug/min via INTRAVENOUS
  Filled 2023-07-11 (×2): qty 250

## 2023-07-11 MED ORDER — ORAL CARE MOUTH RINSE: 15.0000 mL | OROMUCOSAL | Status: DC | PRN

## 2023-07-11 MED ORDER — LIDOCAINE HCL (PF) 1 % IJ SOLN
INTRAMUSCULAR | Status: AC
  Filled 2023-07-11: qty 5

## 2023-07-11 MED ORDER — DOBUTAMINE-DEXTROSE 4-5 MG/ML-% IV SOLN
3.0000 ug/kg/min | INTRAVENOUS | Status: DC
  Administered 2023-07-11: 5 ug/kg/min via INTRAVENOUS
  Filled 2023-07-11 (×2): qty 250

## 2023-07-11 MED ORDER — PANTOPRAZOLE SODIUM 40 MG PO TBEC
40.0000 mg | DELAYED_RELEASE_TABLET | Freq: Every day | ORAL | Status: DC
  Administered 2023-07-11 – 2023-07-16 (×5): 40 mg via ORAL
  Filled 2023-07-11 (×5): qty 1

## 2023-07-11 MED ORDER — FUROSEMIDE 10 MG/ML IJ SOLN
120.0000 mg | Freq: Once | INTRAVENOUS | Status: AC
  Administered 2023-07-11: 120 mg via INTRAVENOUS
  Filled 2023-07-11: qty 10

## 2023-07-11 MED ORDER — MUPIROCIN 2 % EX OINT
1.0000 | TOPICAL_OINTMENT | Freq: Two times a day (BID) | CUTANEOUS | Status: AC
  Administered 2023-07-11 – 2023-07-15 (×8): 1 via NASAL
  Filled 2023-07-11 (×2): qty 22

## 2023-07-11 MED ORDER — METOLAZONE 5 MG PO TABS
5.0000 mg | ORAL_TABLET | Freq: Once | ORAL | Status: AC
  Administered 2023-07-11: 5 mg via ORAL
  Filled 2023-07-11: qty 1

## 2023-07-11 NOTE — Progress Notes (Signed)
Peripherally Inserted Central Catheter Placement  The IV Nurse has discussed with the patient and/or persons authorized to consent for the patient, the purpose of this procedure and the potential benefits and risks involved with this procedure.  The benefits include less needle sticks, lab draws from the catheter, and the patient may be discharged home with the catheter. Risks include, but not limited to, infection, bleeding, blood clot (thrombus formation), and puncture of an artery; nerve damage and irregular heartbeat and possibility to perform a PICC exchange if needed/ordered by physician.  Alternatives to this procedure were also discussed.  Bard Power PICC patient education guide, fact sheet on infection prevention and patient information card has been provided to patient /or left at bedside.    PICC Placement Documentation  PICC Triple Lumen 07/11/23 Right Brachial 41 cm 0 cm (Active)  Indication for Insertion or Continuance of Line Vasoactive infusions 07/11/23 1500  Exposed Catheter (cm) 0 cm 07/11/23 1500  Site Assessment Clean, Dry, Intact 07/11/23 1500  Lumen #1 Status Flushed;Saline locked;Blood return noted 07/11/23 1500  Lumen #2 Status Flushed;Saline locked;Blood return noted 07/11/23 1500  Lumen #3 Status Flushed;Saline locked;Blood return noted 07/11/23 1500  Dressing Type Transparent;Securing device 07/11/23 1500  Dressing Status Antimicrobial disc in place;Clean, Dry, Intact 07/11/23 1500  Line Care Connections checked and tightened 07/11/23 1500  Line Adjustment (NICU/IV Team Only) No 07/11/23 1500  Dressing Intervention New dressing 07/11/23 1500  Dressing Change Due 07/18/23 07/11/23 1500       Franne Grip Renee 07/11/2023, 3:27 PM

## 2023-07-11 NOTE — Progress Notes (Signed)
Heart Failure Navigator Progress Note  Assessed for Heart & Vascular TOC clinic readiness.  Patient does not meet criteria due to Advanced Heart Failure Team Patient.   Navigator will sign off at this time.   Roxy Horseman, RN, BSN [facility] Heart Failure Navigator Secure Chat Only

## 2023-07-11 NOTE — Progress Notes (Signed)
  Echocardiogram 2D Echocardiogram has been performed.  Christopher Ball 07/11/2023, 5:34 PM

## 2023-07-11 NOTE — Procedures (Signed)
Radial arterial line placement.    Emergent consent assumed. The left wrist was prepped and draped in the routine sterile fashion a single lumen radial arterial catheter was placed in the radial artery using a modified Seldinger technique. Good blood flow and wave forms. A dressing was placed.     Romie Minus, MD  3:56 PM

## 2023-07-11 NOTE — Progress Notes (Signed)
Cardiology Progress Note  Patient ID: Christopher Ball MRN: [id number] DOB: 03-06-1971 Date of Encounter: 07/11/2023 Primary Cardiologist: Nanetta Batty, MD  Subjective   Chief Complaint: SOB  HPI: Grossly volume overloaded.  Wedge pressure 37, mean PA pressure 43, EDP 39.  ROS:  All other ROS reviewed and negative. Pertinent positives noted in the HPI.     Vital Signs   Vitals:   07/10/23 1454 07/10/23 2332 07/11/23 0543 07/11/23 0744  BP: (!) 131/43 (!) 127/40  (!) 132/46  Pulse: 91 95  95  Resp: 20 17 18 18   Temp: (!) 97.5 F (36.4 C) 97.7 F (36.5 C) (!) 97.5 F (36.4 C) 97.6 F (36.4 C)  TempSrc: Oral Oral Oral Oral  SpO2: 100% 97%  95%  Weight:   66.2 kg   Height:       No intake or output data in the 24 hours ending 07/11/23 0756    07/11/2023    5:43 AM 07/10/2023    6:18 AM 07/07/2023   11:28 AM  Last 3 Weights  Weight (lbs) 145 lb 14.4 oz 148 lb 147 lb 6.4 oz  Weight (kg) 66.18 kg 67.132 kg 66.86 kg      Telemetry  Overnight telemetry shows sinus rhythm 90s, which I personally reviewed.   Physical Exam   Vitals:   07/10/23 1454 07/10/23 2332 07/11/23 0543 07/11/23 0744  BP: (!) 131/43 (!) 127/40  (!) 132/46  Pulse: 91 95  95  Resp: 20 17 18 18   Temp: (!) 97.5 F (36.4 C) 97.7 F (36.5 C) (!) 97.5 F (36.4 C) 97.6 F (36.4 C)  TempSrc: Oral Oral Oral Oral  SpO2: 100% 97%  95%  Weight:   66.2 kg   Height:       No intake or output data in the 24 hours ending 07/11/23 0756     07/11/2023    5:43 AM 07/10/2023    6:18 AM 07/07/2023   11:28 AM  Last 3 Weights  Weight (lbs) 145 lb 14.4 oz 148 lb 147 lb 6.4 oz  Weight (kg) 66.18 kg 67.132 kg 66.86 kg    Body mass index is 16.86 kg/m.  General: Well nourished, well developed, in no acute distress Head: Atraumatic, normal size  Eyes: PEERLA, EOMI  Neck: Supple, JVD 10-12 cm H20 Endocrine: No thryomegaly Cardiac: Normal S1, S2; RRR; 3/6 diastolic rumble  Lungs: crackles  Abd: Soft,  nontender, no hepatomegaly  Ext: No edema, pulses 2+ Musculoskeletal: No deformities, BUE and BLE strength normal and equal Skin: Warm and dry, no rashes   Neuro: Alert and oriented to person, place, time, and situation, CNII-XII grossly intact, no focal deficits  Psych: Normal mood and affect   Cardiac Studies  RHC/LHC 07/10/2023 1.  Angiographically normal coronary arteries (right dominant) 2.  Severely elevated LVEDP 3.  Severe aortic insufficiency 4.  Moderate pulmonary hypertension with mean PA pressure 43 mmHg, RA pressure 16 mmHg, right heart pressure elevation likely secondary to left heart failure/severe AI (transpulmonary gradient 6 mmHg)  Patient Profile  Christopher Ball is a 52 y.o. male with Marfan's, tobacco abuse, HFpEF admitted for acute on chronic diastolic HF on 07/10/2023.  He has severe aortic regurgitation due to failed Medtronic freestyle aortic valve.  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  Admitted after left heart catheterization heart catheterization showed severely elevated LVEDP and wedge pressure. -Needs diuresis but creatinine is now up to 2.3.  Hold diuresis today.  Suspect he had AKI in the setting of contrast load. -We will have to delay his CT scan of his valve for TAVR planning. -He is presently reports little symptoms of abdominal pain.  I think this could be due to congestion. -Repeat echo today. -CXR ordered  # Abdominal pain # Elevated liver enzymes  -liver enzymes up. Suspect this is due to congestion. Not in shock based on RHC yesterday.  -I have ordered abdominal ultrasound liver Dopplers. -Trend LFTs.  Feeling elevated we will have to hold Tylenol and oxycodone. -PPI ordered as well. -repeat  echo. I am concerned he may be developing shock.  -check lactic acid. Check hepatitis panel.  -hepatitis panel negative   # Tobacco abuse -cessation advised.   FEN -no IVF -code: full -dvt ppx: switch to sq heparin -diet: npo   For questions or updates, please contact [facility] Please consult [email or web address] for contact info under        Signed, Gerri Spore T. Flora Lipps, MD, [facility]  [facility]  07/11/2023 7:56 AM

## 2023-07-11 NOTE — TOC Initial Note (Signed)
Transition of Care [facility]) - Initial/Assessment Note    Patient Details  Name: Christopher Ball MRN: [id number] Date of Birth: April 12, 1971  [facility]) CM/SW Contact:    Nicanor Bake Phone Number: [phone] 07/11/2023, 12:16 PM  Clinical Narrative:  HF CSW met with pt and his father in law at bedside.  Pt stated that he lives with wife and two adult children. Pt stated that he has no history of HH services. Pt stated that he does not use any equipment. Pt stated that he has a scale at home. CSW explained that a hospital follow up appointment will be scheduled clsoer to dc. Pt stated that he already had an appointment scheduled for November 9th, 2024. CSW will call to confirm.   TOC will continue following.                Expected Discharge Plan: Home/Self Care Barriers to Discharge: Continued Medical Work up   Patient Goals and CMS Choice            Expected Discharge Plan and Services       Living arrangements for the past 2 months: Single Family Home                                      Prior Living Arrangements/Services Living arrangements for the past 2 months: Single Family Home Lives with:: Spouse, Adult Children, Pets Patient language and need for interpreter reviewed:: Yes Do you feel safe going back to the place where you live?: Yes      Need for Family Participation in Patient Care: No (Comment) Care giver support system in place?: Yes (comment)   Criminal Activity/Legal Involvement Pertinent to Current Situation/Hospitalization: No - Comment as needed  Activities of Daily Living   ADL Screening (condition at time of admission) Independently performs ADLs?: Yes (appropriate for developmental age) Is the patient deaf or have difficulty hearing?: No Does the patient have difficulty seeing, even when wearing glasses/contacts?: No Does the patient have difficulty concentrating, remembering, or making decisions?: No  Permission  Sought/Granted                  Emotional Assessment Appearance:: Appears older than stated age Attitude/Demeanor/Rapport: Engaged Affect (typically observed): Appropriate Orientation: : Oriented to Self, Oriented to Place, Oriented to  Time, Oriented to Situation Alcohol / Substance Use: Not Applicable Psych Involvement: No (comment)  Admission diagnosis:  Acute on chronic diastolic (congestive) heart failure (HCC) [I50.33] Patient Active Problem List   Diagnosis Date Noted   AKI (acute kidney injury) (HCC) 07/11/2023   Acute on chronic diastolic (congestive) heart failure (HCC) 07/10/2023   Severe aortic regurgitation 06/25/2023   H/O aortic valve replacement 06/25/2023   Aortic atherosclerosis (HCC) 01/16/2023   Emphysema lung (HCC) 01/16/2023   Peyronie disease 07/11/2021   Underweight 07/11/2021   Hyperlipidemia 08/24/2019   Marfan syndrome 08/17/2014   PCP:  Lorre Munroe, NP Pharmacy:   [facility] [phone] [place] Otto, [place] - [address] [place] [address] Phone: [phone] Fax: [phone]     Social Determinants of Health (SDOH) Social History: SDOH Screenings   Food Insecurity: No Food Insecurity (07/11/2023)  Housing: Low Risk  (07/11/2023)  Transportation Needs: No Transportation Needs (07/11/2023)  Utilities: Not At Risk (07/11/2023)  Alcohol Screen: Low Risk  (07/18/2022)  Depression (PHQ2-9): Low Risk  (01/16/2023)  Tobacco Use: High Risk (07/11/2023)   SDOH Interventions:     Readmission Risk Interventions     No data to display

## 2023-07-11 NOTE — Consult Note (Addendum)
Advanced Heart Failure Team Consult Note   Primary Physician: Lorre Munroe, NP PCP-Cardiologist:  Nanetta Batty, MD  Reason for Consultation: Severe Aortic Insufficiency with CGS  HPI:    Christopher Ball is seen today for evaluation of severe aortic insufficiency now with presentation of cardiogenic shock and presurgical optimization at the request of Dr. Excell Seltzer.   Pt is a 52 yo male with past medical history of Marfans syndrome and s/p aortic root replacement with Medtronic 27 mm aortic valve and ascending aortic replacement with a 24 mm Hemashield graft with coronary artery reimplantation in 2007.   August of this year developed abrupt onset of acute chest pain and shortness of breath. He was found to have elevated BNP. CTA was negative for dissection and nl coronaries. Echo EF 55-60 with mild LVHT, nl RV w elevated PAP and severe AI w mod stenosis of bioprosthetic valve. Was treated with Lasix with some improvement. He followed up with cardiology and underwent TEE as below and referred to structural heart.  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  Seen by Structural team 10/21 for surgical evaluation of severe bioprosthetic aortic insufficiency. Now has started evaluation for valve in valve TAVR vs. redo surgery.  Presented yesterday for scheduled heart cath for surgical evaluation (valve in valve TAVR vs. redo surgery) for severe AI. Has been having progressive, persistent shortness of breath at home with low activity, orthopnea, and lightheadedness with position changes. On cath was found to have severely elevated filling pressures and now concern for low output requiring admission. Given IV Lasix.  Labs notable for sCr 2.35, K 5, AST/ALT 180/157, BNP > 4500. CXR w L pleural effusion. Continuing inpatient optimization work up for surgery next week.  R/LHC 10/24:  nl coronaries, severely elevated LVEDP, severe AI (transpulmonary gradient 6 mmHg), mod pHTN, PA 43, RA 16. No CAD.  Home Medications Prior to Admission medications   Medication Sig Start Date End Date Taking? Authorizing Provider  atorvastatin (LIPITOR) 20 MG tablet TAKE 1 TABLET (20 MG TOTAL) BY MOUTH DAILY. KEEP OFFICE VISIT FOR FUTURE REFILLS. 12/12/22  Yes [name], MD  fluticasone (FLONASE) 50 MCG/ACT nasal spray Place 1 spray into both nostrils daily as needed for rhinitis.   Yes [provider]  furosemide (LASIX) 40 MG tablet Take 1 tablet (40 mg total) by mouth daily. 07/01/23 09/29/23 Yes [name], MD  levocetirizine (XYZAL) 5 MG tablet Take 5 mg by mouth every evening.   Yes [provider]  Multiple Vitamins-Minerals (MULTIVITAMIN WITH MINERALS) tablet Take 1 tablet by mouth daily.   Yes [provider]  clindamycin (CLEOCIN) 300 MG capsule Take 2 tablets 1 hour prior to dental appointment 06/18/22   [name], MD  tadalafil (CIALIS) 20 MG tablet Take 1 tablet (20 mg total) by mouth daily. 03/07/23   Lorre Munroe, NP    Past Medical History: Past Medical History:  Diagnosis Date   Marfan syndrome    Stomach problems    Thoracic aortic aneurysm [facility])    replacement by Dr. [name] /30/07 with aortic bioprosthetic valve   Tobacco abuse     Past Surgical History: Past Surgical History:  Procedure Laterality Date   AORTIC VALVE REPLACEMENT     COLONOSCOPY WITH PROPOFOL N/A 08/19/2022   Procedure: COLONOSCOPY WITH PROPOFOL;  Surgeon: Wyline Mood,  MD;  Location: [facility] ENDOSCOPY;  Service: Gastroenterology;  Laterality: N/A;   RIGHT/LEFT HEART CATH AND CORONARY ANGIOGRAPHY N/A 07/10/2023   Procedure: RIGHT/LEFT HEART CATH AND CORONARY ANGIOGRAPHY;  Surgeon: Tonny Bollman, MD;  Location: [facility] INVASIVE CV LAB;  Service: Cardiovascular;  Laterality: N/A;   TEE WITHOUT CARDIOVERSION N/A 06/25/2023   Procedure: TRANSESOPHAGEAL  ECHOCARDIOGRAM;  Surgeon: Chilton Si, MD;  Location: [facility] INVASIVE CV LAB;  Service: Cardiovascular;  Laterality: N/A;   VASECTOMY  09/16/2002    Family History: Family History  Problem Relation Age of Onset   Asthma Mother    Early death Father    Heart disease Father    Asthma Brother    Diabetes Maternal Grandmother    Cancer Neg Hx    Stroke Neg Hx    Kidney disease Neg Hx    Hypertension Neg Hx    Hyperlipidemia Neg Hx     Social History: Social History   Socioeconomic History   Marital status: Married    Spouse name: Not on file   Number of children: Not on file   Years of education: Not on file   Highest education level: Not on file  Occupational History   Not on file  Tobacco Use   Smoking status: Every Day    Current packs/day: 1.00    Average packs/day: 1 pack/day for 31.0 years (31.0 ttl pk-yrs)    Types: Cigars, Cigarettes   Smokeless tobacco: Never  Vaping Use   Vaping status: Every Day  Substance and Sexual Activity   Alcohol use: Yes    Alcohol/week: 1.0 standard drink of alcohol    Types: 1 Shots of liquor per week    Comment: rare   Drug use: Yes    Types: Marijuana   Sexual activity: Yes  Other Topics Concern   Not on file  Social History Narrative   Not on file   Social Determinants of Health   Financial Resource Strain: Not on file  Food Insecurity: No Food Insecurity (07/11/2023)   Hunger Vital Sign    Worried About Running Out of Food in the Last Year: Never true    Ran Out of Food in the Last Year: Never true  Transportation Needs: No Transportation Needs (07/11/2023)   PRAPARE - Administrator, Civil Service (Medical): No    Lack of Transportation (Non-Medical): No  Physical Activity: Not on file  Stress: Not on file  Social Connections: Not on file    Allergies:  Allergies  Allergen Reactions   Augmentin [Amoxicillin-Pot Clavulanate] Shortness Of Breath and Nausea And Vomiting    Objective:    Vital  Signs:   Temp:  [97.5 F (36.4 C)-97.7 F (36.5 C)] 97.6 F (36.4 C) (10/25 0744) Pulse Rate:  [82-95] 95 (10/25 0744) Resp:  [10-34] 18 (10/25 0744) BP: (92-135)/(31-86) 116/31 (10/25 0902) SpO2:  [91 %-100 %] 95 % (10/25 0744) Weight:  [66.2 kg] 66.2 kg (10/25 0543) Last BM Date : 07/10/23  Weight change: Filed Weights   07/10/23 0618 07/11/23 0543  Weight: 67.1 kg 66.2 kg    Intake/Output:  No intake or output data in the 24 hours ending 07/11/23 1104    Physical Exam    General:  Chronically ill appearing. Frail.  HEENT: normal Neck: supple . Carotids 2+ bilat; no bruits. No lymphadenopathy or thyromegaly appreciated. Cor: PMI nondisplaced. Regular rate & rhythm. No rubs, gallops or murmurs. Lungs: diminished Abdomen: flat, nontender, No bruits or masses. Good bowel sounds.  Extremities: no cyanosis, clubbing, rash, no peripheral edema Neuro: alert & orientedx3, cranial nerves grossly intact. moves all 4 extremities w/o difficulty.   Telemetry   SR in 70s (personally reviewed)  EKG    74 bpm NSR  Labs   Basic Metabolic Panel: Recent Labs  Lab 07/10/23 0820 07/10/23 0824 07/10/23 0830 07/10/23 1518 07/11/23 0551  NA 141 141 140 139 138  K 4.2 4.2 4.2 4.0 5.0  CL  --   --   --  109 105  CO2  --   --   --  21* 22  GLUCOSE  --   --   --  154* 130*  BUN  --   --   --  42* 55*  CREATININE  --   --   --  1.88* 2.35*  CALCIUM  --   --   --  7.9* 9.2    Liver Function Tests: Recent Labs  Lab 07/11/23 0551  AST 180*  ALT 157*  ALKPHOS 109  BILITOT 1.5*  PROT 6.6  ALBUMIN 3.4*   No results for input(s): "LIPASE", "AMYLASE" in the last 168 hours. No results for input(s): "AMMONIA" in the last 168 hours.  CBC: Recent Labs  Lab 07/10/23 0820 07/10/23 0824 07/10/23 0830 07/10/23 1518 07/11/23 0551  WBC  --   --   --  10.2 12.3*  HGB 12.6* 12.6* 12.2* 13.2 13.3  HCT 37.0* 37.0* 36.0* 40.8 41.0  MCV  --   --   --  97.8 96.9  PLT  --   --   --   202 197    Cardiac Enzymes: No results for input(s): "CKTOTAL", "CKMB", "CKMBINDEX", "TROPONINI" in the last 168 hours.  BNP: BNP (last 3 results) Recent Labs    05/09/23 1710 06/23/23 1123 07/10/23 1518  BNP 978.2* 1,631.9* >4,500.0*    ProBNP (last 3 results) No results for input(s): "PROBNP" in the last 8760 hours.   CBG: No results for input(s): "GLUCAP" in the last 168 hours.  Coagulation Studies: No results for input(s): "LABPROT", "INR" in the last 72 hours.   Imaging   [place] EKG SITE RITE  Result Date: 07/11/2023 If Site Rite image not attached, placement could not be confirmed due to current cardiac rhythm.    Medications:     Current Medications:  heparin  5,000 Units Subcutaneous Q8H   loratadine  10 mg Oral Daily   pantoprazole  40 mg Oral Daily   polyethylene glycol  17 g Oral Daily   senna-docusate  1 tablet Oral QHS   sodium chloride flush  10 mL Intravenous Q12H   sodium chloride flush  3 mL Intravenous Q12H    Infusions:     Patient Profile   Pt is a 52 yo male with past medical history of Marfans syndrome and s/p aortic root replacement with Medtronic 27 mm aortic valve and ascending aortic replacement with a 24 mm Hemashield graft with coronary artery reimplantation in 2007. Admitted for severe AI and cardiogenic shock.   Assessment/Plan   Severe Aortic Insufficiency now with Cardiogenic Shock  - s/p aortic root replacement with Medtronic 27 mm aortic valve and ascending aortic replacement with a 24 mm Hemashield graft with coronary artery reimplantation in 2007. 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  - Now presumed shock. sCr 1.88>2.35 (some aspect CIN from cath), AST/ALT 180/157 today, LA pending - TEE 10/9: LVEF 60-65, LV and RV nl, mod pericardial effusion, L>R  shunting thru ASD, mod AS, severe AI with bioprosthetic that is fully prolapsing into the LV. - Scheduled for valve in valve TAVR vs  redo surgery w Structural Heart team next Tuesday - TTE results pending - Needs surgical optimization prior to procedure.  - Hold bb with low output. Given metop this morning prior to preop CT scan, however now unable to perform given renal function. - Epi for contractility - Elevated filling pressures on RHC. Will need diuresis, but hold for now in the setting of shock - PICC line, aline, trend Coox, and CVP  2. AKI - Baseline sCr 1.2 - Admit sCr 1.88, now 2.35, CIN could have small contribution however more likely cardio-renal  - Follow BMET  3. Transaminitis - AST/ALT 180/157 - Trend BMET  4. Marfan's Syndrome - s/p aortic root replacement with Medtronic 27 mm aortic valve and ascending aortic replacement with a 24 mm Hemashield graft with coronary artery reimplantation in 2007  Length of Stay: 1  [place] Hermila Millis, NP  07/11/2023, 11:04 AM  Advanced Heart Failure Team Pager [phone] (M-F; 7a - 5p)  Please contact [facility] for night-coverage after hours (4p -7a ) and weekends on [email or web address]

## 2023-07-12 ENCOUNTER — Encounter (HOSPITAL_COMMUNITY): Payer: Self-pay | Admitting: Anesthesiology

## 2023-07-12 DIAGNOSIS — R570 Cardiogenic shock: Secondary | ICD-10-CM | POA: Diagnosis not present

## 2023-07-12 DIAGNOSIS — I5033 Acute on chronic diastolic (congestive) heart failure: Secondary | ICD-10-CM | POA: Diagnosis not present

## 2023-07-12 DIAGNOSIS — I351 Nonrheumatic aortic (valve) insufficiency: Secondary | ICD-10-CM | POA: Diagnosis not present

## 2023-07-12 LAB — BASIC METABOLIC PANEL
Anion gap: 15 (ref 5–15)
Anion gap: 13 (ref 5–15)
Anion gap: 12 (ref 5–15)
BUN: 78 mg/dL — ABNORMAL HIGH (ref 6–20)
BUN: 78 mg/dL — ABNORMAL HIGH (ref 6–20)
BUN: 81 mg/dL — ABNORMAL HIGH (ref 6–20)
CO2: 20 mmol/L — ABNORMAL LOW (ref 22–32)
CO2: 18 mmol/L — ABNORMAL LOW (ref 22–32)
CO2: 23 mmol/L (ref 22–32)
Calcium: 8.9 mg/dL (ref 8.9–10.3)
Calcium: 8.1 mg/dL — ABNORMAL LOW (ref 8.9–10.3)
Calcium: 8.5 mg/dL — ABNORMAL LOW (ref 8.9–10.3)
Chloride: 103 mmol/L (ref 98–111)
Chloride: 103 mmol/L (ref 98–111)
Chloride: 98 mmol/L (ref 98–111)
Creatinine, Ser: 3.58 mg/dL — ABNORMAL HIGH (ref 0.61–1.24)
Creatinine, Ser: 3.12 mg/dL — ABNORMAL HIGH (ref 0.61–1.24)
Creatinine, Ser: 3.25 mg/dL — ABNORMAL HIGH (ref 0.61–1.24)
GFR, Estimated: 20 mL/min — ABNORMAL LOW (ref >60)
GFR, Estimated: 23 mL/min — ABNORMAL LOW (ref >60)
GFR, Estimated: 22 mL/min — ABNORMAL LOW (ref >60)
Glucose, Bld: 162 mg/dL — ABNORMAL HIGH (ref 70–99)
Glucose, Bld: 154 mg/dL — ABNORMAL HIGH (ref 70–99)
Glucose, Bld: 163 mg/dL — ABNORMAL HIGH (ref 70–99)
Potassium: 5.1 mmol/L (ref 3.5–5.1)
Potassium: 4.1 mmol/L (ref 3.5–5.1)
Potassium: 4 mmol/L (ref 3.5–5.1)
Sodium: 138 mmol/L (ref 135–145)
Sodium: 134 mmol/L — ABNORMAL LOW (ref 135–145)
Sodium: 133 mmol/L — ABNORMAL LOW (ref 135–145)

## 2023-07-12 LAB — COMPREHENSIVE METABOLIC PANEL
ALT: 752 U/L — ABNORMAL HIGH (ref 0–44)
AST: 869 U/L — ABNORMAL HIGH (ref 15–41)
Albumin: 3.1 g/dL — ABNORMAL LOW (ref 3.5–5.0)
Alkaline Phosphatase: 103 U/L (ref 38–126)
Anion gap: 15 (ref 5–15)
BUN: 76 mg/dL — ABNORMAL HIGH (ref 6–20)
CO2: 20 mmol/L — ABNORMAL LOW (ref 22–32)
Calcium: 8.9 mg/dL (ref 8.9–10.3)
Chloride: 102 mmol/L (ref 98–111)
Creatinine, Ser: 3.56 mg/dL — ABNORMAL HIGH (ref 0.61–1.24)
GFR, Estimated: 20 mL/min — ABNORMAL LOW (ref >60)
Glucose, Bld: 165 mg/dL — ABNORMAL HIGH (ref 70–99)
Potassium: 5.1 mmol/L (ref 3.5–5.1)
Sodium: 137 mmol/L (ref 135–145)
Total Bilirubin: 1.4 mg/dL — ABNORMAL HIGH (ref 0.3–1.2)
Total Protein: 6.1 g/dL — ABNORMAL LOW (ref 6.5–8.1)

## 2023-07-12 LAB — COOXEMETRY PANEL
Carboxyhemoglobin: 1 % (ref 0.5–1.5)
Carboxyhemoglobin: 0.8 % (ref 0.5–1.5)
Carboxyhemoglobin: 0.6 % (ref 0.5–1.5)
Carboxyhemoglobin: 0.7 % (ref 0.5–1.5)
Methemoglobin: <0.7 % (ref 0.0–1.5)
Methemoglobin: <0.7 % (ref 0.0–1.5)
Methemoglobin: <0.7 % (ref 0.0–1.5)
Methemoglobin: <0.7 % (ref 0.0–1.5)
O2 Saturation: 41.1 %
O2 Saturation: 47.4 %
O2 Saturation: 46.8 %
O2 Saturation: 45.3 %
Total hemoglobin: 12.4 g/dL (ref 12.0–16.0)
Total hemoglobin: 12.5 g/dL (ref 12.0–16.0)
Total hemoglobin: 12.2 g/dL (ref 12.0–16.0)
Total hemoglobin: 12.3 g/dL (ref 12.0–16.0)

## 2023-07-12 LAB — CBC
HCT: 37.2 % — ABNORMAL LOW (ref 39.0–52.0)
Hemoglobin: 12.3 g/dL — ABNORMAL LOW (ref 13.0–17.0)
MCH: 32.9 pg (ref 26.0–34.0)
MCHC: 33.1 g/dL (ref 30.0–36.0)
MCV: 99.5 fL (ref 80.0–100.0)
Platelets: 160 10*3/uL (ref 150–400)
RBC: 3.74 MIL/uL — ABNORMAL LOW (ref 4.22–5.81)
RDW: 13.3 % (ref 11.5–15.5)
WBC: 12.1 10*3/uL — ABNORMAL HIGH (ref 4.0–10.5)
nRBC: 0 % (ref 0.0–0.2)

## 2023-07-12 LAB — LACTIC ACID, PLASMA: Lactic Acid, Venous: 2 mmol/L (ref 0.5–1.9)

## 2023-07-12 LAB — URINALYSIS, ROUTINE W REFLEX MICROSCOPIC
Bilirubin Urine: NEGATIVE
Glucose, UA: NEGATIVE mg/dL
Hgb urine dipstick: NEGATIVE
Ketones, ur: NEGATIVE mg/dL
Leukocytes,Ua: NEGATIVE
Nitrite: NEGATIVE
Protein, ur: NEGATIVE mg/dL
Specific Gravity, Urine: 1.005 (ref 1.005–1.030)
pH: 5 (ref 5.0–8.0)

## 2023-07-12 LAB — TYPE AND SCREEN
ABO/RH(D): A POS
Antibody Screen: NEGATIVE

## 2023-07-12 LAB — CG4 I-STAT (LACTIC ACID)
Lactic Acid, Venous: 2.3 mmol/L (ref 0.5–1.9)
Lactic Acid, Venous: 1.5 mmol/L (ref 0.5–1.9)
Lactic Acid, Venous: 1.1 mmol/L (ref 0.5–1.9)

## 2023-07-12 MED ORDER — CHLORHEXIDINE GLUCONATE 4 % EX SOLN
1.0000 | Freq: Once | CUTANEOUS | Status: AC
  Administered 2023-07-13: 1 via TOPICAL
  Filled 2023-07-12: qty 15

## 2023-07-12 MED ORDER — NOREPINEPHRINE 4 MG/250ML-% IV SOLN
0.0000 ug/min | INTRAVENOUS | Status: AC
  Administered 2023-07-13: 4 ug/min via INTRAVENOUS
  Filled 2023-07-12 (×4): qty 250

## 2023-07-12 MED ORDER — BISACODYL 5 MG PO TBEC
5.0000 mg | DELAYED_RELEASE_TABLET | Freq: Once | ORAL | Status: AC
  Administered 2023-07-12: 5 mg via ORAL
  Filled 2023-07-12: qty 1

## 2023-07-12 MED ORDER — METOLAZONE 5 MG PO TABS: 10.0000 mg | ORAL_TABLET | Freq: Two times a day (BID) | ORAL | Status: DC

## 2023-07-12 MED ORDER — CHLORHEXIDINE GLUCONATE 0.12 % MT SOLN
15.0000 mL | Freq: Once | OROMUCOSAL | Status: AC
  Administered 2023-07-13: 15 mL via OROMUCOSAL
  Filled 2023-07-12: qty 15

## 2023-07-12 MED ORDER — METOLAZONE 5 MG PO TABS
10.0000 mg | ORAL_TABLET | Freq: Once | ORAL | Status: AC
  Administered 2023-07-12: 10 mg via ORAL
  Filled 2023-07-12: qty 2

## 2023-07-12 MED ORDER — TEMAZEPAM 15 MG PO CAPS: 15.0000 mg | ORAL_CAPSULE | Freq: Once | ORAL | Status: DC | PRN

## 2023-07-12 MED ORDER — POTASSIUM CHLORIDE CRYS ER 20 MEQ PO TBCR
40.0000 meq | EXTENDED_RELEASE_TABLET | Freq: Once | ORAL | Status: AC
  Administered 2023-07-12: 40 meq via ORAL
  Filled 2023-07-12: qty 2

## 2023-07-12 MED ORDER — DEXMEDETOMIDINE HCL IN NACL 400 MCG/100ML IV SOLN
0.1000 ug/kg/h | INTRAVENOUS | Status: AC
  Administered 2023-07-13: 1 ug/kg/h via INTRAVENOUS
  Filled 2023-07-12 (×4): qty 100

## 2023-07-12 MED ORDER — HEPARIN 30,000 UNITS/1000 ML (OHS) CELLSAVER SOLUTION
Status: DC
  Filled 2023-07-12 (×2): qty 1000

## 2023-07-12 MED ORDER — CEFAZOLIN SODIUM-DEXTROSE 2-4 GM/100ML-% IV SOLN
2.0000 g | INTRAVENOUS | Status: AC
  Administered 2023-07-13: 2 g via INTRAVENOUS
  Filled 2023-07-12 (×3): qty 100

## 2023-07-12 MED ORDER — NOREPINEPHRINE 16 MG/250ML-% IV SOLN
0.0000 ug/min | INTRAVENOUS | Status: DC
  Administered 2023-07-12: 5 ug/min via INTRAVENOUS
  Filled 2023-07-12 (×3): qty 250

## 2023-07-12 MED ORDER — POTASSIUM CHLORIDE 2 MEQ/ML IV SOLN
80.0000 meq | INTRAVENOUS | Status: DC
  Filled 2023-07-12 (×3): qty 40

## 2023-07-12 MED ORDER — MAGNESIUM SULFATE 50 % IJ SOLN
40.0000 meq | INTRAMUSCULAR | Status: DC
  Filled 2023-07-12 (×2): qty 9.85

## 2023-07-12 NOTE — Progress Notes (Signed)
Case has been discussed with Dr. Lynnette Caffey and Dr. Laneta Simmers. Given his clinical stabilization on dobutamine and diuretics as well as improving urine output and in the absence of clear MCS bail out options, plan will be for TAVR valve 10/27 in the AM as long as patient remains compensated. Family updated on plan.

## 2023-07-12 NOTE — Progress Notes (Signed)
2 Days Post-Op Procedure(s) (LRB): RIGHT/LEFT HEART CATH AND CORONARY ANGIOGRAPHY (N/A) Subjective:  Says he feels ok  Hemodynamics stabilized on dobut 5, NE 5.  Diuresing well on lasix 20/hr and 10 metolazone.  Objective: Vital signs in last 24 hours: Temp:  [97.4 F (36.3 C)-97.7 F (36.5 C)] 97.4 F (36.3 C) (10/26 0530) Pulse Rate:  [78-97] 88 (10/26 1530) Cardiac Rhythm: Normal sinus rhythm (10/26 1200) Resp:  [14-29] 20 (10/26 1530) BP: (90-209)/(36-67) 123/42 (10/26 1530) SpO2:  [75 %-100 %] 96 % (10/26 1530) Arterial Line BP: (57-160)/(21-127) 72/54 (10/26 1130) Weight:  [68 kg] 68 kg (10/26 0446)  Hemodynamic parameters for last 24 hours: CVP:  [1 mmHg-26 mmHg] 22 mmHg  Intake/Output from previous day: 10/25 0701 - 10/26 0700 In: 675.8 [P.O.:120; I.V.:499.8; IV Piggyback:55.9] Out: 1200 [Urine:800; Emesis/NG output:400] Intake/Output this shift: Total I/O In: 154.2 [I.V.:154.2] Out: 2225 [Urine:2225]  General appearance: alert and cooperative Neurologic: intact Heart: regular rate and rhythm, systolic and diastolic murmurs Lungs: clear to auscultation bilaterally Extremities: extremities warm, no edema  Lab Results: Recent Labs    07/11/23 0551 07/12/23 0422  WBC 12.3* 12.1*  HGB 13.3 12.3*  HCT 41.0 37.2*  PLT 197 160   BMET:  Recent Labs    07/12/23 0422 07/12/23 1056  NA 137  138 134*  K 5.1  5.1 4.1  CL 102  103 103  CO2 20*  20* 18*  GLUCOSE 165*  162* 154*  BUN 76*  78* 78*  CREATININE 3.56*  3.58* 3.12*  CALCIUM 8.9  8.9 8.1*    PT/INR: No results for input(s): "LABPROT", "INR" in the last 72 hours. ABG    Component Value Date/Time   PHART 7.378 07/10/2023 0814   HCO3 21.9 07/10/2023 0830   TCO2 23 07/10/2023 0830   ACIDBASEDEF 3.0 (H) 07/10/2023 0830   O2SAT 46.8 07/12/2023 1056   CBG (last 3)  No results for input(s): "GLUCAP" in the last 72 hours.  Assessment/Plan:  He has stabilized and after discussion with  Dr. Elwyn Lade and Dr. Lynnette Caffey we have decided to proceed with transfemoral TAVR tomorrow morning. We discussed complications that might develop including but not limited to risks of death, stroke, paravalvular leak, aortic dissection or other major vascular complications, aortic annulus rupture, device embolization, cardiac rupture or perforation, mitral regurgitation, acute myocardial infarction, arrhythmia, heart block or bradycardia requiring permanent pacemaker placement, congestive heart failure, respiratory failure, renal failure, pneumonia, infection, other late complications related to structural valve deterioration or migration, or other complications that might ultimately cause a temporary or permanent loss of functional independence or other long term morbidity. The patient provides full informed consent for the procedure as described and all questions were answered. He is not a candidate for surgical bailout given his prior history of aortic root and ascending aortic replacement and his critically ill state.     LOS: 2 days    Alleen Borne 07/12/2023

## 2023-07-12 NOTE — Progress Notes (Signed)
Advanced Heart Failure Rounding Note  PCP-Cardiologist: Nanetta Batty, MD   Subjective:     Feels somewhat improved this morning, his appetite is back.   Objective:   Weight Range: 68 kg Body mass index is 17.32 kg/m.   Vital Signs:   Temp:  [96 F (35.6 C)-97.7 F (36.5 C)] 97.4 F (36.3 C) (10/26 0530) Pulse Rate:  [75-94] 89 (10/26 1115) Resp:  [13-42] 19 (10/26 1115) BP: (90-209)/(32-96) 191/49 (10/26 1115) SpO2:  [75 %-100 %] 94 % (10/26 1115) Arterial Line BP: (57-160)/(18-127) 126/122 (10/26 1115) Weight:  [68 kg] 68 kg (10/26 0446) Last BM Date : 07/11/23  Weight change: Filed Weights   07/10/23 0618 07/11/23 0543 07/12/23 0446  Weight: 67.1 kg 66.2 kg 68 kg    Intake/Output:   Intake/Output Summary (Last 24 hours) at 07/12/2023 1132 Last data filed at 07/12/2023 1100 Gross per 24 hour  Intake 760.54 ml  Output 1800 ml  Net -1039.46 ml      Physical Exam    GENERAL: Well nourished and in no apparent distress at rest.  HEENT: NCAT. There is no scleral icterus.  CHEST: There are no chest wall deformities. There is no chest wall tenderness. Respirations are unlabored.  Lungs- Clear to auscultation bilaterally, normal work of breathing. CARDIAC:  JVP: Severely elevated with significant v waves          Normal rate with regular rhythm. Systolic and diastolic murmurs. Bounding pulses.. No LE edema.  ABDOMEN: Soft, non-tender, non-distended. Marland Kitchen  EXTREMITIES: Warm and well perfused with no cyanosis, clubbing.  NEUROLOGIC: Patient is oriented x3 with no focal or lateralizing neurologic deficits.  PSYCH: Patients affect is appropriate, there is no evidence of anxiety or depression.  SKIN: Warm and dry; no lesions or wounds.     Telemetry   Sinus in the 80s  Patient Profile   Patient is a 52 y.o. male with a history of Marfan's syndrome, PAPVR, ASD, aortic root replacement with graft and medtronic freestyle graft placement who presented for  scheduled RHC/LHC as part of TAVR workup. Now with worsening cardiogenic shock in the setting of severe AR.  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 Lactate peaked at 4.5, required dobutamine and norepinephrine to maintain MAP.  - Improved overnight on inopressor support, lactate normalized and urine output increasing. - Long discussion with structural team and shock team. MCS unlikely to be beneficial in severe AR, LAVA could be a bailout but not preferred. Given improvement will tentatively plan for TAVR on Monday or Tuesday. - Continue dobutmaine 65mcg/kg/min, norepi weaned off - If MAPs >90 then would need to start likely IV nitroglycerin - Will need to use CTA cardiac study for valve planning, now with ARF and needs HR to compensate - NO nodal blockade - Coox 47, but underestimates true cardiac output given left to right PAPVR and ASD. Could consider high SVC sat if needing true output, but trend is reasonable to follow for now - PICC line for CVP and dobutamin - EF now 40-45% - IV lasix gtt at 20mg /hour, 10mg  metolazone x1  AKI on CKD: ATN in the setting of valvular shock. - Diuresis as above -  Urine output increasing  Elevated liver enzymes: Congestive with improving shock. - Trend daily  Marfan's Syndrome - s/p aortic root replacement with Medtronic 27 mm aortic valve and ascending aortic replacement with a 24 mm Hemashield graft with coronary artery reimplantation in 2007    Length of Stay: 2  Romie Minus, MD  07/12/2023, 11:32 AM  Advanced Heart Failure Team Pager [phone] (M-F; 7a - 5p)  Please contact [facility]  for night-coverage after hours (5p -7a ) and weekends on [email or web address]   CRITICAL CARE Performed by: Romie Minus   Total critical care time: 60 minutes  Critical care time was exclusive of separately billable procedures and treating other patients.  Critical care was necessary to treat or prevent imminent or life-threatening deterioration.  Critical care was time spent personally by me on the following activities: development of treatment plan with patient and/or surrogate as well as nursing, discussions with consultants, evaluation of patient's response to treatment, examination of patient, obtaining history from patient or surrogate, ordering and performing treatments and interventions, ordering and review of laboratory studies, ordering and review of radiographic studies, pulse oximetry and re-evaluation of patient's condition.

## 2023-07-12 NOTE — Progress Notes (Signed)
Patient Name: Christopher Ball Date of Encounter: 07/12/2023 [facility] Cardiologist: Christopher Batty, MD   Interval Summary  .    Dobutamine 5/norepinephrine 5/Lasix drip 20  CVP 13, MAP 60s, pulse pressure of around 80 mmHg, I's and O's net -1750  The patient is breathing well.  He is able to lay flat without issues.  Vital Signs .    Vitals:   07/12/23 1515 07/12/23 1530 07/12/23 1545 07/12/23 1600  BP: (!) 120/48 (!) 123/42 (!) 119/43 (!) 124/40  Pulse: 88 88 90 87  Resp: (!) 21 20 (!) 21 (!) 27  Temp:      TempSrc:      SpO2: 97% 96% 96% 98%  Weight:      Height:        Intake/Output Summary (Last 24 hours) at 07/12/2023 1629 Last data filed at 07/12/2023 1600 Gross per 24 hour  Intake 1105 ml  Output 3425 ml  Net -2320 ml      07/12/2023    4:46 AM 07/11/2023    5:43 AM 07/10/2023    6:18 AM  Last 3 Weights  Weight (lbs) 149 lb 14.6 oz 145 lb 14.4 oz 148 lb  Weight (kg) 68 kg 66.18 kg 67.132 kg      Telemetry/ECG    Sinus rhythm- Personally Reviewed  Physical Exam .   GEN: No acute distress.   Neck: Jugular venous pressure of around 8 mmHg Cardiac: 3 out of 6 diastolic murmur at left lower sternal border Respiratory: Clear to auscultation bilaterally. GI: Soft, nontender, non-distended  MS: No edema; warm and well-perfused  Assessment & Plan .     Acute on chronic diastolic heart failure with cardiogenic shock: The patient fortunately has been stabilized on dobutamine and norepinephrine.  He has a history of Marfan's and underwent a 27 mm freestyle homograft implantation with a 24 mm Hemashield graft with coronary reimplantation and 2007.  He has aortic insufficiency that has recently become severe due to leaflet prolapse.  He is fairly well compensated at this time.  His course has been marked by acute kidney injury and his creatinine is around 3 and downtrending.  I have had multiple conversations with Dr. Elwyn Ball over the past 36 hours  and the consensus opinion is that we should proceed to urgent transcatheter valve replacement tomorrow as there is fear that the patient would decompensate between now and Tuesday.  I did have a long conversation with the patient and his wife (over the phone).  I described our plan for tomorrow including transfemoral TAVR from the RTF approach and the use of cerebral embolic protection from the right radial approach.  Dr. Larina Ball will place left arterial line tomorrow.  All questions were answered.  CRITICAL CARE Performed by: Orbie Pyo   Total critical care time: 30 minutes  Critical care time was exclusive of separately billable procedures and treating other patients.  Critical care was necessary to treat or prevent imminent or life-threatening deterioration.  Critical care was time spent personally by me on the following activities: development of treatment plan with patient and/or surrogate as well as nursing, discussions with consultants, evaluation of patient's response to treatment, examination of patient, obtaining history from patient or surrogate, ordering and performing treatments and interventions, ordering and review of laboratory studies, ordering and review of radiographic studies, pulse oximetry and re-evaluation of patient's condition.    For questions or updates, please contact [facility] Please consult [email or web address] for contact  info under        Signed, Orbie Pyo, MD

## 2023-07-13 ENCOUNTER — Encounter (HOSPITAL_COMMUNITY)
Admission: AD | Disposition: A | Discharge status: Home / Self Care | Payer: Self-pay | Source: Home / Self Care | Attending: Cardiovascular Disease

## 2023-07-13 ENCOUNTER — Inpatient Hospital Stay (HOSPITAL_COMMUNITY): Payer: Federal, State, Local not specified - PPO | Admitting: Anesthesiology

## 2023-07-13 ENCOUNTER — Inpatient Hospital Stay (HOSPITAL_COMMUNITY): Payer: Federal, State, Local not specified - PPO

## 2023-07-13 ENCOUNTER — Inpatient Hospital Stay (HOSPITAL_COMMUNITY)
Admission: AD | Disposition: A | Discharge status: Home / Self Care | Payer: Self-pay | Source: Home / Self Care | Attending: Cardiovascular Disease

## 2023-07-13 DIAGNOSIS — Z952 Presence of prosthetic heart valve: Secondary | ICD-10-CM

## 2023-07-13 DIAGNOSIS — J984 Other disorders of lung: Secondary | ICD-10-CM | POA: Diagnosis not present

## 2023-07-13 DIAGNOSIS — I5033 Acute on chronic diastolic (congestive) heart failure: Secondary | ICD-10-CM | POA: Diagnosis not present

## 2023-07-13 DIAGNOSIS — R570 Cardiogenic shock: Secondary | ICD-10-CM | POA: Diagnosis not present

## 2023-07-13 DIAGNOSIS — I719 Aortic aneurysm of unspecified site, without rupture: Secondary | ICD-10-CM | POA: Diagnosis not present

## 2023-07-13 DIAGNOSIS — I2510 Atherosclerotic heart disease of native coronary artery without angina pectoris: Secondary | ICD-10-CM | POA: Diagnosis not present

## 2023-07-13 DIAGNOSIS — Z006 Encounter for examination for normal comparison and control in clinical research program: Secondary | ICD-10-CM | POA: Diagnosis not present

## 2023-07-13 DIAGNOSIS — T82897A Other specified complication of cardiac prosthetic devices, implants and grafts, initial encounter: Secondary | ICD-10-CM | POA: Diagnosis not present

## 2023-07-13 DIAGNOSIS — T8201XA Breakdown (mechanical) of heart valve prosthesis, initial encounter: Secondary | ICD-10-CM | POA: Diagnosis not present

## 2023-07-13 DIAGNOSIS — I5043 Acute on chronic combined systolic (congestive) and diastolic (congestive) heart failure: Secondary | ICD-10-CM | POA: Diagnosis not present

## 2023-07-13 DIAGNOSIS — N170 Acute kidney failure with tubular necrosis: Secondary | ICD-10-CM | POA: Diagnosis not present

## 2023-07-13 DIAGNOSIS — I350 Nonrheumatic aortic (valve) stenosis: Secondary | ICD-10-CM | POA: Diagnosis not present

## 2023-07-13 DIAGNOSIS — J90 Pleural effusion, not elsewhere classified: Secondary | ICD-10-CM | POA: Diagnosis not present

## 2023-07-13 DIAGNOSIS — J9811 Atelectasis: Secondary | ICD-10-CM | POA: Diagnosis not present

## 2023-07-13 HISTORY — DX: Presence of prosthetic heart valve: Z95.2

## 2023-07-13 LAB — POTASSIUM: Potassium: 4.1 mmol/L (ref 3.5–5.1)

## 2023-07-13 LAB — CBC
HCT: 35.4 % — ABNORMAL LOW (ref 39.0–52.0)
Hemoglobin: 12.1 g/dL — ABNORMAL LOW (ref 13.0–17.0)
MCH: 32.6 pg (ref 26.0–34.0)
MCHC: 34.2 g/dL (ref 30.0–36.0)
MCV: 95.4 fL (ref 80.0–100.0)
Platelets: 151 10*3/uL (ref 150–400)
RBC: 3.71 MIL/uL — ABNORMAL LOW (ref 4.22–5.81)
RDW: 13.2 % (ref 11.5–15.5)
WBC: 11.1 10*3/uL — ABNORMAL HIGH (ref 4.0–10.5)
nRBC: 0 % (ref 0.0–0.2)

## 2023-07-13 LAB — COMPREHENSIVE METABOLIC PANEL
ALT: 721 U/L — ABNORMAL HIGH (ref 0–44)
AST: 468 U/L — ABNORMAL HIGH (ref 15–41)
Albumin: 3.4 g/dL — ABNORMAL LOW (ref 3.5–5.0)
Alkaline Phosphatase: 126 U/L (ref 38–126)
Anion gap: 11 (ref 5–15)
BUN: 84 mg/dL — ABNORMAL HIGH (ref 6–20)
CO2: 28 mmol/L (ref 22–32)
Calcium: 8.3 mg/dL — ABNORMAL LOW (ref 8.9–10.3)
Chloride: 94 mmol/L — ABNORMAL LOW (ref 98–111)
Creatinine, Ser: 2.84 mg/dL — ABNORMAL HIGH (ref 0.61–1.24)
GFR, Estimated: 26 mL/min — ABNORMAL LOW (ref >60)
Glucose, Bld: 169 mg/dL — ABNORMAL HIGH (ref 70–99)
Potassium: 3.6 mmol/L (ref 3.5–5.1)
Sodium: 133 mmol/L — ABNORMAL LOW (ref 135–145)
Total Bilirubin: 1.3 mg/dL — ABNORMAL HIGH (ref 0.3–1.2)
Total Protein: 6.5 g/dL (ref 6.5–8.1)

## 2023-07-13 LAB — ECHOCARDIOGRAM LIMITED
AR max vel: 2.32 cm2
AV Area VTI: 2.11 cm2
AV Area mean vel: 1.05 cm2
AV Mean grad: 5.5 mm[Hg]
AV Peak grad: 10.8 mm[Hg]
Ao pk vel: 1.65 m/s
Height: 78 in
P 1/2 time: 156 ms
Weight: 2172.85 [oz_av]

## 2023-07-13 LAB — POCT I-STAT 7, (LYTES, BLD GAS, ICA,H+H)
Acid-Base Excess: 4 mmol/L — ABNORMAL HIGH (ref 0.0–2.0)
Bicarbonate: 27.8 mmol/L (ref 20.0–28.0)
Calcium, Ion: 1.09 mmol/L — ABNORMAL LOW (ref 1.15–1.40)
HCT: 40 % (ref 39.0–52.0)
Hemoglobin: 13.6 g/dL (ref 13.0–17.0)
O2 Saturation: 96 %
Patient temperature: 98.2
Potassium: 3.7 mmol/L (ref 3.5–5.1)
Sodium: 136 mmol/L (ref 135–145)
TCO2: 29 mmol/L (ref 22–32)
pCO2 arterial: 38.4 mm[Hg] (ref 32–48)
pH, Arterial: 7.467 — ABNORMAL HIGH (ref 7.35–7.45)
pO2, Arterial: 79 mm[Hg] — ABNORMAL LOW (ref 83–108)

## 2023-07-13 LAB — BASIC METABOLIC PANEL
Anion gap: 11 (ref 5–15)
BUN: 77 mg/dL — ABNORMAL HIGH (ref 6–20)
CO2: 35 mmol/L — ABNORMAL HIGH (ref 22–32)
Calcium: 9 mg/dL (ref 8.9–10.3)
Chloride: 90 mmol/L — ABNORMAL LOW (ref 98–111)
Creatinine, Ser: 2.31 mg/dL — ABNORMAL HIGH (ref 0.61–1.24)
GFR, Estimated: 33 mL/min — ABNORMAL LOW (ref >60)
Glucose, Bld: 128 mg/dL — ABNORMAL HIGH (ref 70–99)
Potassium: 3.1 mmol/L — ABNORMAL LOW (ref 3.5–5.1)
Sodium: 136 mmol/L (ref 135–145)

## 2023-07-13 LAB — PROTIME-INR
INR: 1.3 — ABNORMAL HIGH (ref 0.8–1.2)
Prothrombin Time: 16.5 s — ABNORMAL HIGH (ref 11.4–15.2)

## 2023-07-13 LAB — COOXEMETRY PANEL
Carboxyhemoglobin: 1 % (ref 0.5–1.5)
Carboxyhemoglobin: 1.4 % (ref 0.5–1.5)
Carboxyhemoglobin: 1.7 % — ABNORMAL HIGH (ref 0.5–1.5)
Methemoglobin: <0.7 % (ref 0.0–1.5)
Methemoglobin: <0.7 % (ref 0.0–1.5)
Methemoglobin: <0.7 % (ref 0.0–1.5)
O2 Saturation: 60.4 %
O2 Saturation: 71.6 %
O2 Saturation: 67.1 %
Total hemoglobin: 12.9 g/dL (ref 12.0–16.0)
Total hemoglobin: 13.3 g/dL (ref 12.0–16.0)
Total hemoglobin: 14.6 g/dL (ref 12.0–16.0)

## 2023-07-13 LAB — LACTIC ACID, PLASMA: Lactic Acid, Venous: 0.8 mmol/L (ref 0.5–1.9)

## 2023-07-13 LAB — HEMOGLOBIN A1C
Hgb A1c MFr Bld: 5.6 % (ref 4.8–5.6)
Mean Plasma Glucose: 114.02 mg/dL

## 2023-07-13 LAB — APTT: aPTT: 28 s (ref 24–36)

## 2023-07-13 SURGERY — TRANSCATHETER AORTIC VALVE REPLACEMENT, TRANSFEMORAL (CATHLAB)
Anesthesia: General

## 2023-07-13 SURGERY — TRANSCATHETER AORTIC VALVE REPLACEMENT, TRANSFEMORAL (CATHLAB)
Anesthesia: Monitor Anesthesia Care

## 2023-07-13 MED ORDER — ACETAMINOPHEN 325 MG PO TABS: 650.0000 mg | ORAL_TABLET | Freq: Four times a day (QID) | ORAL | Status: DC | PRN

## 2023-07-13 MED ORDER — HYDRALAZINE HCL 25 MG PO TABS
25.0000 mg | ORAL_TABLET | Freq: Three times a day (TID) | ORAL | Status: DC
  Administered 2023-07-13 – 2023-07-15 (×6): 25 mg via ORAL
  Filled 2023-07-13 (×6): qty 1

## 2023-07-13 MED ORDER — LIDOCAINE HCL (PF) 1 % IJ SOLN
INTRAMUSCULAR | Status: AC
  Filled 2023-07-13: qty 30

## 2023-07-13 MED ORDER — METOPROLOL TARTRATE 5 MG/5ML IV SOLN: 2.5000 mg | INTRAVENOUS | Status: DC | PRN

## 2023-07-13 MED ORDER — POTASSIUM CHLORIDE 10 MEQ/50ML IV SOLN
10.0000 meq | INTRAVENOUS | Status: AC
  Administered 2023-07-13 (×2): 10 meq via INTRAVENOUS
  Filled 2023-07-13: qty 50

## 2023-07-13 MED ORDER — LIDOCAINE HCL (PF) 1 % IJ SOLN
INTRAMUSCULAR | Status: DC | PRN
  Administered 2023-07-13: 12 mL
  Administered 2023-07-13: 5 mL
  Administered 2023-07-13: 12 mL

## 2023-07-13 MED ORDER — PROTAMINE SULFATE 10 MG/ML IV SOLN
INTRAVENOUS | Status: DC | PRN
  Administered 2023-07-13: 10 mg via INTRAVENOUS
  Administered 2023-07-13 (×2): 20 mg via INTRAVENOUS

## 2023-07-13 MED ORDER — SODIUM CHLORIDE 0.9% FLUSH
3.0000 mL | Freq: Two times a day (BID) | INTRAVENOUS | Status: DC
  Administered 2023-07-13 – 2023-07-16 (×6): 3 mL via INTRAVENOUS

## 2023-07-13 MED ORDER — POTASSIUM CHLORIDE 10 MEQ/100ML IV SOLN
10.0000 meq | Freq: Once | INTRAVENOUS | Status: DC
  Filled 2023-07-13: qty 100

## 2023-07-13 MED ORDER — CLOPIDOGREL BISULFATE 75 MG PO TABS
75.0000 mg | ORAL_TABLET | Freq: Every day | ORAL | Status: DC
  Administered 2023-07-14: 75 mg via ORAL
  Filled 2023-07-13: qty 1

## 2023-07-13 MED ORDER — ASPIRIN 81 MG PO CHEW: 81.0000 mg | CHEWABLE_TABLET | Freq: Every day | ORAL | Status: DC

## 2023-07-13 MED ORDER — PHENYLEPHRINE HCL-NACL 20-0.9 MG/250ML-% IV SOLN
0.0000 ug/min | INTRAVENOUS | Status: DC
Start: 2023-07-13 — End: 2023-07-13

## 2023-07-13 MED ORDER — POTASSIUM CHLORIDE 10 MEQ/50ML IV SOLN
INTRAVENOUS | Status: AC
  Administered 2023-07-13: 10 meq via INTRAVENOUS
  Filled 2023-07-13: qty 50

## 2023-07-13 MED ORDER — MORPHINE SULFATE (PF) 2 MG/ML IV SOLN: 1.0000 mg | INTRAVENOUS | Status: DC | PRN

## 2023-07-13 MED ORDER — TRAMADOL HCL 50 MG PO TABS: 50.0000 mg | ORAL_TABLET | ORAL | Status: DC | PRN

## 2023-07-13 MED ORDER — VERAPAMIL HCL 2.5 MG/ML IV SOLN
INTRAVENOUS | Status: AC
  Filled 2023-07-13: qty 2

## 2023-07-13 MED ORDER — HEPARIN SODIUM (PORCINE) 1000 UNIT/ML IJ SOLN
INTRAMUSCULAR | Status: DC | PRN
  Administered 2023-07-13: 5000 [IU] via INTRAVENOUS
  Administered 2023-07-13: 3000 [IU] via INTRAVENOUS
  Administered 2023-07-13: 9500 [IU] via INTRAVENOUS

## 2023-07-13 MED ORDER — FENTANYL CITRATE (PF) 100 MCG/2ML IJ SOLN
INTRAMUSCULAR | Status: AC
  Filled 2023-07-13: qty 2

## 2023-07-13 MED ORDER — IOHEXOL 350 MG/ML SOLN
INTRAVENOUS | Status: DC | PRN
  Administered 2023-07-13: 65 mL via INTRA_ARTERIAL

## 2023-07-13 MED ORDER — POTASSIUM CHLORIDE 10 MEQ/50ML IV SOLN
10.0000 meq | INTRAVENOUS | Status: AC
  Administered 2023-07-13 (×6): 10 meq via INTRAVENOUS
  Filled 2023-07-13: qty 50

## 2023-07-13 MED ORDER — LACTATED RINGERS IV SOLN: INTRAVENOUS | Status: DC | PRN

## 2023-07-13 MED ORDER — POTASSIUM CHLORIDE 10 MEQ/50ML IV SOLN
10.0000 meq | Freq: Once | INTRAVENOUS | Status: AC
  Filled 2023-07-13: qty 50

## 2023-07-13 MED ORDER — MIDAZOLAM HCL 2 MG/2ML IJ SOLN
INTRAMUSCULAR | Status: DC | PRN
  Administered 2023-07-13 (×2): 1 mg via INTRAVENOUS

## 2023-07-13 MED ORDER — MIDAZOLAM HCL 2 MG/2ML IJ SOLN
INTRAMUSCULAR | Status: AC
  Filled 2023-07-13: qty 2

## 2023-07-13 MED ORDER — VERAPAMIL HCL 2.5 MG/ML IV SOLN
INTRAVENOUS | Status: DC | PRN
  Administered 2023-07-13: 10 mL via INTRA_ARTERIAL

## 2023-07-13 MED ORDER — CEFAZOLIN SODIUM-DEXTROSE 2-4 GM/100ML-% IV SOLN
2.0000 g | Freq: Every day | INTRAVENOUS | Status: AC
  Administered 2023-07-13: 2 g via INTRAVENOUS
  Filled 2023-07-13: qty 100

## 2023-07-13 MED ORDER — LABETALOL HCL 5 MG/ML IV SOLN
INTRAVENOUS | Status: DC | PRN
  Administered 2023-07-13: 5 mg via INTRAVENOUS

## 2023-07-13 MED ORDER — HEPARIN (PORCINE) IN NACL 1000-0.9 UT/500ML-% IV SOLN
INTRAVENOUS | Status: DC | PRN
  Administered 2023-07-13 (×2): 500 mL

## 2023-07-13 MED ORDER — ACETAMINOPHEN 650 MG RE SUPP: 650.0000 mg | Freq: Four times a day (QID) | RECTAL | Status: DC | PRN

## 2023-07-13 MED ORDER — PROPOFOL 500 MG/50ML IV EMUL
INTRAVENOUS | Status: DC | PRN
  Administered 2023-07-13: 15 ug/kg/min via INTRAVENOUS

## 2023-07-13 SURGICAL SUPPLY — 33 items
BAG SNAP BAND KOVER 36X36 (MISCELLANEOUS) ×2 IMPLANT
CABLE ADAPT PACING TEMP 12FT (ADAPTER) IMPLANT
CATH 26 ULTRA DELIVERY (CATHETERS) IMPLANT
CATH DIAG 6FR JR4 (CATHETERS) IMPLANT
CATH DIAG 6FR PIGTAIL ANGLED (CATHETERS) IMPLANT
CATH INFINITI 6F AL1 (CATHETERS) IMPLANT
CATH OMNI FLUSH 5F 65CM (CATHETERS) IMPLANT
CLOSURE MYNX CONTROL 6F/7F (Vascular Products) IMPLANT
CLOSURE PERCLOSE PROSTYLE (VASCULAR PRODUCTS) IMPLANT
CRIMPER (MISCELLANEOUS) IMPLANT
DEVICE INFLATION ATRION QL2530 (MISCELLANEOUS) IMPLANT
DEVICE RAD COMP TR BAND LRG (VASCULAR PRODUCTS) IMPLANT
FILTER CEREBRAL PROT SENTINEL (FILTER) IMPLANT
GLIDESHEATH SLEND SS 6F .021 (SHEATH) IMPLANT
KIT SAPIAN 3 ULTRA RESILIA 26 (Valve) IMPLANT
KIT SYRINGE INJ CVI SPIKEX1 (MISCELLANEOUS) IMPLANT
PACK CARDIAC CATHETERIZATION (CUSTOM PROCEDURE TRAY) ×1 IMPLANT
SET ATX-X65L (MISCELLANEOUS) IMPLANT
SHEATH INTRODUCER SET 20-26 (SHEATH) IMPLANT
SHEATH PINNACLE 6F 10CM (SHEATH) IMPLANT
SHEATH PINNACLE 8F 10CM (SHEATH) IMPLANT
SHEATH PROBE COVER 6X72 (BAG) IMPLANT
STOPCOCK MORSE 400PSI 3WAY (MISCELLANEOUS) ×2 IMPLANT
TRANSDUCER W/STOPCOCK (MISCELLANEOUS) IMPLANT
TUBING ART PRESS 72 MALE/FEM (TUBING) IMPLANT
TUBING CIL FLEX 10 FLL-RA (TUBING) IMPLANT
WIRE AMPLATZ SS-J .035X180CM (WIRE) IMPLANT
WIRE CHOICE GRAPHX PT 300 (WIRE) IMPLANT
WIRE EMERALD 3MM-J .035X150CM (WIRE) IMPLANT
WIRE EMERALD 3MM-J .035X260CM (WIRE) IMPLANT
WIRE EMERALD ST .035X260CM (WIRE) IMPLANT
WIRE MICRO SET SILHO 5FR 7 (SHEATH) IMPLANT
WIRE SAFARI SM CURVE 275 (WIRE) IMPLANT

## 2023-07-13 NOTE — Transfer of Care (Signed)
Immediate Anesthesia Transfer of Care Note  Patient: Christopher Ball  Procedure(s) Performed: Transcatheter Aortic Valve Replacement, Transfemoral  Patient Location: PACU  Anesthesia Type:MAC  Level of Consciousness: awake, alert , and oriented  Airway & Oxygen Therapy: Patient Spontanous Breathing, Patient connected to nasal cannula oxygen, and Patient connected to face mask oxygen  Post-op Assessment: Report given to RN and Post -op Vital signs reviewed and stable  Post vital signs: Reviewed and stable  Last Vitals:  Vitals Value Taken Time  BP    Temp    Pulse    Resp    SpO2      Last Pain:  Vitals:   07/13/23 0800  TempSrc:   PainSc: 0-No pain      Patients Stated Pain Goal: 0 (07/13/23 0400)  Complications: There were no known notable events for this encounter.

## 2023-07-13 NOTE — Anesthesia Procedure Notes (Signed)
Procedure Name: MAC Date/Time: 07/13/2023 9:37 AM  Performed by: Marena Chancy, CRNAPre-anesthesia Checklist: Patient identified, Emergency Drugs available, Suction available, Patient being monitored and Timeout performed Patient Re-evaluated:Patient Re-evaluated prior to induction Oxygen Delivery Method: Simple face mask

## 2023-07-13 NOTE — Anesthesia Postprocedure Evaluation (Signed)
Anesthesia Post Note  Patient: Christopher Ball  Procedure(s) Performed: Transcatheter Aortic Valve Replacement, Transfemoral     Patient location during evaluation: Cath Lab Anesthesia Type: MAC Level of consciousness: awake and alert Pain management: pain level controlled Vital Signs Assessment: post-procedure vital signs reviewed and stable Respiratory status: spontaneous breathing, nonlabored ventilation and respiratory function stable Cardiovascular status: stable and blood pressure returned to baseline Postop Assessment: no apparent nausea or vomiting Anesthetic complications: no   There were no known notable events for this encounter.  Last Vitals:  Vitals:   07/13/23 1300 07/13/23 1315  BP:    Pulse: (!) 56 (!) 57  Resp: 17 20  Temp:    SpO2: 96% 96%    Last Pain:  Vitals:   07/13/23 1215  TempSrc:   PainSc: 0-No pain                 Evalyne Cortopassi

## 2023-07-13 NOTE — Progress Notes (Signed)
Advanced Heart Failure Rounding Note  PCP-Cardiologist: Nanetta Batty, MD   Subjective:     6.8L of urine output overnight, lasix drip stopped on the way to the cath lab, left arterial line placed. TAVR with Dr. Lynnette Caffey 10/27. Remains on levo at 10 for MAP goal 65.   Objective:   Weight Range: 61.6 kg Body mass index is 15.69 kg/m.   Vital Signs:   Temp:  [97.6 F (36.4 C)-98.2 F (36.8 C)] 97.6 F (36.4 C) (10/27 0600) Pulse Rate:  [80-97] 85 (10/27 0800) Resp:  [10-39] 20 (10/27 0800) BP: (92-209)/(32-67) 124/47 (10/27 0745) SpO2:  [89 %-100 %] 89 % (10/27 0800) Arterial Line BP: (72-141)/(38-127) 72/54 (10/26 1130) Weight:  [61.6 kg] 61.6 kg (10/27 0430) Last BM Date : 07/10/24  Weight change: Filed Weights   07/11/23 0543 07/12/23 0446 07/13/23 0430  Weight: 66.2 kg 68 kg 61.6 kg    Intake/Output:   Intake/Output Summary (Last 24 hours) at 07/13/2023 0827 Last data filed at 07/13/2023 0800 Gross per 24 hour  Intake 1123.36 ml  Output 7950 ml  Net -6826.64 ml      Physical Exam    GENERAL: Well nourished and in no apparent distress at rest.  HEENT: NCAT. There is no scleral icterus.  CHEST: There are no chest wall deformities. There is no chest wall tenderness. Respirations are unlabored.  Lungs- Clear to auscultation bilaterally, normal work of breathing. CARDIAC:  JVP: significatnly elevated with significant v waves          Normal rate with regular rhythm. Systolic and diastolic murmurs. Bounding pulses.. No LE edema.  ABDOMEN: Soft, non-tender, non-distended. Marland Kitchen  EXTREMITIES: Warm and well perfused with no cyanosis, clubbing.  NEUROLOGIC: Patient is oriented x3 with no focal or lateralizing neurologic deficits.  PSYCH: Patients affect is appropriate, there is no evidence of anxiety or depression.  SKIN: Warm and dry; no lesions or wounds.     Telemetry   Sinus in the 80s  Patient Profile   Patient is a 52 y.o. male with a history of  Marfan's syndrome, PAPVR, ASD, aortic root replacement with graft and medtronic freestyle graft placement who presented for scheduled RHC/LHC as part of TAVR workup. Now with worsening cardiogenic shock in the setting of severe AR.  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 Lactate peaked at 4.5, required dobutamine and norepinephrine to maintain MAP.  - 6.9L urine output yesterday, levo at 10, improved appetite and exam, lactate resolved - Long discussion with structural team and shock team. MCS unlikely to be beneficial in severe AR, LAVA could be a bailout but not preferred. TAVR scheduled for 10/27 - Continue dobutmaine 61mcg/kg/min, reassess after procedure - NO nodal blockade - Coox 60, significantly improved, but underestimates true cardiac output given left to right PAPVR and ASD. Could consider high SVC sat if needing true output, but trend is reasonable to follow for now - PICC line for CVP and dobutamine - EF now 40-45% - IV lasix gtt at 20mg /hour, 10mg  metolazone x1 10/26 with excellent response  AKI on CKD: ATN in the setting of valvular shock. - Diuresis as above - Urine output increasing  Elevated liver  enzymes: Congestive with improving shock. - Trend daily  Marfan's Syndrome - s/p aortic root replacement with Medtronic 27 mm aortic valve and ascending aortic replacement with a 24 mm Hemashield graft with coronary artery reimplantation in 2007    Length of Stay: 3  Romie Minus, MD  07/13/2023, 8:27 AM  Advanced Heart Failure Team Pager [phone] (M-F; 7a - 5p)  Please contact [facility] for night-coverage after  hours (5p -7a ) and weekends on [email or web address]   CRITICAL CARE Performed by: Romie Minus   Total critical care time: 45 minutes  Critical care time was exclusive of separately billable procedures and treating other patients.  Critical care was necessary to treat or prevent imminent or life-threatening deterioration.  Critical care was time spent personally by me on the following activities: development of treatment plan with patient and/or surrogate as well as nursing, discussions with consultants, evaluation of patient's response to treatment, examination of patient, obtaining history from patient or surrogate, ordering and performing treatments and interventions, ordering and review of laboratory studies, ordering and review of radiographic studies, pulse oximetry and re-evaluation of patient's condition.

## 2023-07-13 NOTE — Op Note (Signed)
[facility]   MULTIDISCIPLINARY HEART VALVE TEAM   TAVR OPERATIVE NOTE   Date of Procedure:  07/13/2023  Preoperative Diagnosis: 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  Postoperative Diagnosis: Same   Procedure:   Transcatheter Aortic Valve in Valve Replacement - Percutaneous Right Transfemoral Approach  Edwards Sapien 3 Ultra Resilia THV (size 26 mm, model # 9755RSL, serial # [id number])   Co-Surgeons:  Alleen Borne, MD and Alverda Skeans, MD   Anesthesiologist:  Corky Sox, MD  Echocardiographer:  Guinevere Scarlet, MD  Pre-operative Echo Findings: Severe aortic insufficiency Moderate left ventricular systolic dysfunction  Post-operative Echo Findings: No paravalvular leak Moderate left ventricular systolic dysfunction   BRIEF CLINICAL NOTE AND INDICATIONS FOR SURGERY  He is a 52 y.o. 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 We discussed complications that might develop including but not limited to risks of death, stroke, paravalvular leak, aortic dissection or other major vascular complications, aortic annulus rupture, device embolization, cardiac rupture or perforation, mitral regurgitation, acute myocardial infarction, arrhythmia, heart block or bradycardia requiring permanent pacemaker placement, congestive heart failure, respiratory failure, renal failure, pneumonia, infection, other late complications related to structural valve deterioration or migration, or other complications that might ultimately cause a temporary or permanent loss of functional independence or other long term morbidity. The patient  provides full informed consent for the procedure as described and all questions were answered. He is not a candidate for surgical bailout given his prior history of aortic root and ascending aortic replacement and his critically ill state.      DETAILS OF THE OPERATIVE PROCEDURE  PREPARATION:    The patient was brought to the operating room on the above mentioned date and appropriate monitoring was established by the anesthesia team. The patient was placed in the supine position on the operating table.  Intravenous antibiotics were administered. The patient was monitored closely throughout the procedure under conscious sedation.    Baseline transthoracic echocardiogram was performed. The patient's abdomen and both groins were prepped and draped in a sterile manner. A time out procedure was performed.   PERIPHERAL ACCESS:    Using the modified Seldinger technique, femoral arterial access was obtained with placement of a 6 Fr sheaths on the left side.  A pigtail diagnostic catheter was passed through the left arterial sheath under fluoroscopic guidance into the aortic root. Aortic root angiography was performed in order to determine the optimal angiographic angle for valve deployment.   TRANSFEMORAL ACCESS:   Percutaneous transfemoral access and sheath placement was performed using ultrasound guidance.  The right common femoral artery was cannulated using a micropuncture needle and appropriate location was verified using hand injection angiogram.  A pair of Abbott Perclose percutaneous closure devices were placed and a 6 French sheath replaced into the femoral artery.  The patient was heparinized systemically and ACT verified > 250 seconds.    A 14 Fr transfemoral E-sheath was introduced into the right common femoral artery after progressively dilating over an Amplatz superstiff wire. An AL-1 catheter was used to direct a straight-tip exchange length wire across the native aortic valve into the  left ventricle. This was exchanged out for a pigtail catheter and  position was confirmed in the LV apex. Simultaneous LV and Ao pressures were recorded.  The pigtail catheter was exchanged for a Safari wire in the LV apex. Direct LV pacing threshold was tested through the [facility] wire and was satisfactory. Then a Sentinel cerebral protection device was inserted as dictated in Dr. Trula Ore note.  BALLOON AORTIC VALVULOPLASTY:   Not performed   TRANSCATHETER HEART VALVE DEPLOYMENT:   An Edwards Sapien 3 Ultra transcatheter heart valve (size 26 mm) was prepared and crimped per manufacturer's guidelines, and the proper orientation of the valve is confirmed on the [employer] delivery system. The valve was advanced through the introducer sheath using normal technique until in an appropriate position in the abdominal aorta beyond the sheath tip. The balloon was then retracted and using the fine-tuning wheel was centered on the valve. The valve was then advanced across the aortic arch using appropriate flexion of the catheter. The valve was carefully positioned across the aortic valve annulus. The Commander catheter was retracted using normal technique. Once final position of the valve has been confirmed by angiographic assessment, the valve is deployed during rapid ventricular pacing to maintain systolic blood pressure < 50 mmHg and pulse pressure < 10 mmHg. The balloon inflation is held for >3 seconds after reaching full deployment volume. Once the balloon has fully deflated the balloon is retracted into the ascending aorta and valve function is assessed using echocardiography. There is felt to be no paravalvular leak and no central aortic insufficiency.  The patient's hemodynamic recovery following valve deployment is good.  The deployment balloon and guidewire are both removed.    PROCEDURE COMPLETION:   The Sentinel device was retrieved. The sheath was removed and femoral artery closure performed.   Protamine was administered once femoral arterial repair was complete. The pigtail catheter and femoral sheath was removed with manual pressure used for venous hemostasis.  A Mynx femoral closure device was utilized following removal of the diagnostic sheath in the left femoral artery.  The patient tolerated the procedure well and is transported to the cath lab recovery area in stable condition. There were no immediate intraoperative complications. All sponge instrument and needle counts are verified correct at completion of the operation.   No blood products were administered during the operation.  The patient received a total of 65 mL of intravenous contrast during the procedure.   Alleen Borne, MD 07/13/2023

## 2023-07-13 NOTE — Progress Notes (Signed)
Patient ID: Christopher Ball, male   DOB: [birth year], 52 y.o.   MRN: [id number]  TCTS Evening Rounds:  Hemodynamics stable. Dobut down to 3, NE off. Sinus 70. Sats 98% 2L  Diuresing well.  Feels better. Groin sites dry.  BMET    Component Value Date/Time   NA 136 07/13/2023 1243   NA 146 (H) 06/23/2023 1123   K 3.1 (L) 07/13/2023 1243   CL 90 (L) 07/13/2023 1243   CO2 35 (H) 07/13/2023 1243   GLUCOSE 128 (H) 07/13/2023 1243   BUN 77 (H) 07/13/2023 1243   BUN 29 (H) 06/23/2023 1123   CREATININE 2.31 (H) 07/13/2023 1243   CREATININE 1.16 01/16/2023 0926   CALCIUM 9.0 07/13/2023 1243   EGFR 60 06/23/2023 1123   GFRNONAA 33 (L) 07/13/2023 1243   Replacing K+

## 2023-07-13 NOTE — Anesthesia Preprocedure Evaluation (Addendum)
Anesthesia Evaluation  Patient identified by MRN, date of birth, ID band Patient awake    Reviewed: Allergy & Precautions, NPO status , Patient's Chart, lab work & pertinent test results  History of Anesthesia Complications Negative for: history of anesthetic complications  Airway Mallampati: III  TM Distance: >3 FB Neck ROM: Full    Dental  (+) Teeth Intact, Dental Advisory Given   Pulmonary neg shortness of breath, COPD, neg recent URI, Current Smoker and Patient abstained from smoking.   breath sounds clear to auscultation       Cardiovascular +CHF  + Valvular Problems/Murmurs AS and AI  Rhythm:Regular   1. Left ventricular ejection fraction, by estimation, is 40 to 45%. The  left ventricle has mildly decreased function. The left ventricle  demonstrates global hypokinesis. The left ventricular internal cavity size  was dilated. Left ventricular diastolic  function could not be evaluated.   2. Left atrial size was dilated.   3. Right atrial size was dilated.   4. Large pleural effusion in the left lateral region.   5. The mitral valve is degenerative. Mild to moderate mitral valve  regurgitation. No evidence of mitral stenosis.   6. Tricuspid valve regurgitation is moderate to severe.   7. History of aortic root replacement with Medtronic freestyle 27 mm  aortic prosthesis and ascending aorta repair (24 mm graft) in 2007. Severe  aortic regurgitation with what appears to be prolapse of the noncoronary  cusp into the LV. Moderate valvular  aortic stenosis (peak velocity 3.4 m/s, mean gradient 26 mmHg).   8. Aortic Aortic root/ascending aorta has been repaired/replaced.   9. There is moderately elevated pulmonary artery systolic pressure. The  estimated right ventricular systolic pressure is 53.2 mmHg.  10. The inferior vena cava is dilated in size with <50% respiratory  variability, suggesting right atrial pressure of 15 mmHg.      1.  Angiographically normal coronary arteries (right dominant) 2.  Severely elevated LVEDP 3.  Severe aortic insufficiency 4.  Moderate pulmonary hypertension with mean PA pressure 43 mmHg, RA pressure 16 mmHg, right heart pressure elevation likely secondary to left heart failure/severe AI (transpulmonary gradient 6 mmHg)   Recommendation: Hospital admission, IV diuresis, inpatient workup for valve in valve TAVR versus redo surgery     Neuro/Psych negative neurological ROS  negative psych ROS   GI/Hepatic negative GI ROS, Neg liver ROS,,,  Endo/Other  negative endocrine ROS    Renal/GU Renal diseaseLab Results      Component                Value               Date                      NA                       136                 07/13/2023                K                        3.7                 07/13/2023                CO2  28                  07/13/2023                GLUCOSE                  169 (H)             07/13/2023                BUN                      84 (H)              07/13/2023                CREATININE               2.84 (H)            07/13/2023                CALCIUM                  8.3 (L)             07/13/2023                EGFR                     60                  06/23/2023                GFRNONAA                 26 (L)              07/13/2023                Musculoskeletal negative musculoskeletal ROS (+)    Abdominal   Peds  Hematology negative hematology ROS (+) Lab Results      Component                Value               Date                      WBC                      11.1 (H)            07/13/2023                HGB                      13.6                07/13/2023                HCT                      40.0                07/13/2023                MCV                      95.4  07/13/2023                PLT                      151                 07/13/2023              Anesthesia  Other Findings 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 now with bioprosthetic valve failure  Reproductive/Obstetrics                             Anesthesia Physical Anesthesia Plan  ASA: 5 and emergent  Anesthesia Plan: MAC   Post-op Pain Management: Minimal or no pain anticipated   Induction: Intravenous  PONV Risk Score and Plan: 0 and Ondansetron and Treatment may vary due to age or medical condition  Airway Management Planned: Nasal Cannula, Natural Airway and Simple Face Mask  Additional Equipment: Arterial line  Intra-op Plan:   Post-operative Plan:   Informed Consent: I have reviewed the patients History and Physical, chart, labs and discussed the procedure including the risks, benefits and alternatives for the proposed anesthesia with the patient or authorized representative who has indicated his/her understanding and acceptance.     Dental advisory given  Plan Discussed with: CRNA  Anesthesia Plan Comments:         Anesthesia Quick Evaluation

## 2023-07-13 NOTE — Op Note (Signed)
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  Postoperative Diagnosis: Same   Procedure:   Transcatheter Aortic Valve Replacement - Transfemoral Approach  Edwards Sapien 3 Resilia THV (size 26 mm, model # [id number])  Cerebral embolic protection (via right ulnar artery)   Co-Surgeons:   Alleen Borne, MD and Alverda Skeans, MD Anesthesiologist:  Laurene Footman, MD  Echocardiographer:  Lennie Odor, MD  Pre-operative Echo Findings: Severe bioprosthetic aortic valve degeneration Cardiogenic shock Moderate left ventricular systolic dysfunction  Post-operative Echo Findings: No paravalvular leak Moderate left ventricular systolic dysfunction  Left Heart Catheterization Findings: Left ventricular end-diastolic pressure of   BRIEF CLINICAL NOTE AND INDICATIONS FOR SURGERY  The patient is a 52 year old male with a 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  He was stabilized by the advanced heart failure team.  After multidisciplinary review, consensus opinion was that the patient should be referred for urgent transcatheter arctic valve replacement for the indication of severe bioprosthetic aortic valve degeneration with severe aortic insufficiency and cardiogenic shock.  During the course of the patient's preoperative work up they have been evaluated comprehensively by a multidisciplinary team of specialists coordinated through the Multidisciplinary Heart Valve Clinic in the [facility].  They have been demonstrated to suffer from symptomatic severe aortic stenosis as noted above. The patient has been counseled extensively as to  the relative risks and benefits of all options for the treatment of severe aortic stenosis including long term medical therapy, conventional surgery for aortic valve replacement, and transcatheter aortic valve replacement.  The patient has been independently evaluated by Dr. Leafy Ro with CT surgery and they are felt to be at high risk for conventional surgical aortic valve replacement. The surgeon indicated the patient would be a poor candidate for conventional surgery. Based upon review of all of the patient's preoperative diagnostic tests they are felt to be candidate for transcatheter aortic valve replacement using the transfemoral approach as an alternative to high risk conventional surgery.    Following the decision to proceed with transcatheter aortic valve replacement, a discussion has been held regarding what types of management strategies would be attempted intraoperatively in the event of life-threatening complications, including whether or not the patient would be considered a candidate for the use of cardiopulmonary bypass and/or conversion to open sternotomy for attempted surgical intervention.  The patient has been advised of a variety of complications that might develop peculiar to this approach including but not limited to risks of death, stroke, paravalvular leak, aortic dissection or other major vascular complications, aortic annulus rupture, device embolization, cardiac rupture or perforation, acute myocardial infarction, arrhythmia, heart block or bradycardia requiring permanent pacemaker placement, congestive heart failure, respiratory failure, renal failure, pneumonia, infection, other late complications related to structural valve deterioration or migration, or other complications that might ultimately cause a temporary or permanent loss of functional independence or other long term morbidity.  The patient provides full informed consent for the procedure as described and all questions were  answered preoperatively.    DETAILS OF THE OPERATIVE PROCEDURE  PREPARATION:   The patient is brought to the operating room on the above mentioned date and central monitoring was established by the anesthesia team  including placement of a radial arterial line. The patient is placed in the supine position on the operating table.  Intravenous antibiotics are administered. Conscious sedation is used.   Baseline transthoracic echocardiogram was performed. The patient's chest, abdomen, both groins, and both lower extremities are prepared and draped in a sterile manner. A time out procedure is performed.   PERIPHERAL ACCESS:   Using the modified Seldinger technique, femoral arterial and venous access were obtained with placement of a 6 Fr sheath in the left femoral artery using u/s guidance.  A pigtail diagnostic catheter was passed through the femoral arterial sheath under fluoroscopic guidance into the aortic root.   Aortic root angiography was performed in order to determine the optimal angiographic angle for valve deployment.  TRANSFEMORAL ACCESS:  A micropuncture kit was used to gain access to the right femoral artery using u/s guidance. Position confirmed with angiography. Pre-closure with double ProGlide closure devices. The patient was heparinized systemically and ACT verified > 250 seconds.    A 14 Fr transfemoral E-sheath was introduced into the right femoral artery after progressively dilating over an Amplatz superstiff wire. An AL-1 catheter was used to direct a straight-tip exchange length wire across the native aortic valve into the left ventricle. This was exchanged out for a pigtail catheter and position was confirmed in the LV apex. Simultaneous left ventricular, aortic, and left ventricular end-diastolic pressures were recorded.  The pigtail catheter was then exchanged for an Safari wire in the LV apex.  Direct LV pacing thresholds were assessed and found to be adequate.   SENTINEL  DEPLOYMENT:  Ultrasound assisted access of the right radial artery was pursued.  Ultrasound examination demonstrated it to lack pulsatility.  Despite entering the artery with the needle we had no blood return.  For this reason we obtained ulnar access with ultrasound.  A 6 French sheath was placed.  5 mg of verapamil was administered.  The Sentinel device which had been prepared off field was then introduced into the body.  The proximal basket was deployed in the right innominate artery and the distal basket implanted in the left carotid artery without difficulty.  TRANSCATHETER HEART VALVE DEPLOYMENT:  An Edwards Sapien 3 THV (size 26 mm) was prepared and crimped per manufacturer's guidelines, and the proper orientation of the valve is confirmed on the [employer] delivery system. The valve was advanced through the introducer sheath using normal technique until in an appropriate position in the abdominal aorta beyond the sheath tip. The balloon was then retracted and using the fine-tuning wheel was centered on the valve. The valve was then advanced across the aortic arch using appropriate flexion of the catheter. The valve was carefully positioned across the aortic valve annulus. The Commander catheter was retracted using normal technique. Once final position of the valve has been confirmed by angiographic assessment, the valve is deployed while temporarily holding ventilation and during rapid ventricular pacing to maintain systolic blood pressure < 50 mmHg and pulse pressure < 10 mmHg. The balloon inflation is held for >3 seconds after reaching full deployment volume. Once the balloon has fully deflated the balloon is retracted into the ascending aorta and valve function is assessed using TTE. There is felt to be no paravalvular leak and no central aortic insufficiency.  The patient's hemodynamic recovery following valve deployment is good.  The deployment balloon and guidewire are both removed. Echo  demostrated acceptable post-procedural gradients, stable mitral valve function, and no AI.   PROCEDURE COMPLETION:  The  Sentinel device was retrieved.  The 16 French sheath was then removed and closure devices were completed. Protamine was administered once femoral arterial repair was complete. The temporary pacemaker, pigtail catheters and femoral sheaths were removed with  a Mynx closure device placed in the artery and manual pressure used for venous hemostasis.    The patient tolerated the procedure well and is transported to the surgical intensive care in stable condition. There were no immediate intraoperative complications. All sponge instrument and needle counts are verified correct at completion of the operation.   No blood products were administered during the operation.  The patient received a total of 65 mL of intravenous contrast during the procedure.  Orbie Pyo MD 07/13/2023 11:36 AM

## 2023-07-14 ENCOUNTER — Other Ambulatory Visit: Payer: Self-pay | Admitting: Physician Assistant

## 2023-07-14 ENCOUNTER — Inpatient Hospital Stay (HOSPITAL_COMMUNITY)
Admit: 2023-07-14 | Discharge: 2023-07-14 | Disposition: A | Discharge status: Home / Self Care | Payer: Federal, State, Local not specified - PPO

## 2023-07-14 ENCOUNTER — Other Ambulatory Visit (HOSPITAL_COMMUNITY): Payer: Self-pay

## 2023-07-14 ENCOUNTER — Inpatient Hospital Stay (HOSPITAL_COMMUNITY): Payer: Federal, State, Local not specified - PPO

## 2023-07-14 ENCOUNTER — Encounter (HOSPITAL_COMMUNITY): Payer: Self-pay | Admitting: Cardiovascular Disease

## 2023-07-14 DIAGNOSIS — I5043 Acute on chronic combined systolic (congestive) and diastolic (congestive) heart failure: Secondary | ICD-10-CM | POA: Diagnosis not present

## 2023-07-14 DIAGNOSIS — I5033 Acute on chronic diastolic (congestive) heart failure: Secondary | ICD-10-CM | POA: Diagnosis not present

## 2023-07-14 DIAGNOSIS — N170 Acute kidney failure with tubular necrosis: Secondary | ICD-10-CM | POA: Diagnosis not present

## 2023-07-14 DIAGNOSIS — Z952 Presence of prosthetic heart valve: Secondary | ICD-10-CM | POA: Diagnosis not present

## 2023-07-14 DIAGNOSIS — T82897A Other specified complication of cardiac prosthetic devices, implants and grafts, initial encounter: Secondary | ICD-10-CM | POA: Diagnosis not present

## 2023-07-14 DIAGNOSIS — R570 Cardiogenic shock: Secondary | ICD-10-CM | POA: Diagnosis not present

## 2023-07-14 DIAGNOSIS — Z006 Encounter for examination for normal comparison and control in clinical research program: Secondary | ICD-10-CM | POA: Diagnosis not present

## 2023-07-14 LAB — CBC
HCT: 45.3 % (ref 39.0–52.0)
Hemoglobin: 15 g/dL (ref 13.0–17.0)
MCH: 31.9 pg (ref 26.0–34.0)
MCHC: 33.1 g/dL (ref 30.0–36.0)
MCV: 96.4 fL (ref 80.0–100.0)
Platelets: 146 10*3/uL — ABNORMAL LOW (ref 150–400)
RBC: 4.7 MIL/uL (ref 4.22–5.81)
RDW: 13.3 % (ref 11.5–15.5)
WBC: 12.4 10*3/uL — ABNORMAL HIGH (ref 4.0–10.5)
nRBC: 0 % (ref 0.0–0.2)

## 2023-07-14 LAB — ECHOCARDIOGRAM COMPLETE
AR max vel: 1.86 cm2
AV Area VTI: 1.77 cm2
AV Area mean vel: 1.74 cm2
AV Mean grad: 8 mm[Hg]
AV Peak grad: 16.6 mm[Hg]
Ao pk vel: 2.04 m/s
Area-P 1/2: 3.85 cm2
Calc EF: 30.6 %
Height: 78 in
S' Lateral: 3.3 cm
Single Plane A2C EF: 39.5 %
Single Plane A4C EF: 25.3 %
Weight: 2038.81 [oz_av]

## 2023-07-14 LAB — COMPREHENSIVE METABOLIC PANEL
ALT: 552 U/L — ABNORMAL HIGH (ref 0–44)
AST: 214 U/L — ABNORMAL HIGH (ref 15–41)
Albumin: 3.7 g/dL (ref 3.5–5.0)
Alkaline Phosphatase: 162 U/L — ABNORMAL HIGH (ref 38–126)
Anion gap: 15 (ref 5–15)
BUN: 62 mg/dL — ABNORMAL HIGH (ref 6–20)
CO2: 31 mmol/L (ref 22–32)
Calcium: 9.6 mg/dL (ref 8.9–10.3)
Chloride: 90 mmol/L — ABNORMAL LOW (ref 98–111)
Creatinine, Ser: 1.91 mg/dL — ABNORMAL HIGH (ref 0.61–1.24)
GFR, Estimated: 42 mL/min — ABNORMAL LOW (ref >60)
Glucose, Bld: 114 mg/dL — ABNORMAL HIGH (ref 70–99)
Potassium: 4 mmol/L (ref 3.5–5.1)
Sodium: 136 mmol/L (ref 135–145)
Total Bilirubin: 1.6 mg/dL — ABNORMAL HIGH (ref 0.3–1.2)
Total Protein: 7.5 g/dL (ref 6.5–8.1)

## 2023-07-14 LAB — COOXEMETRY PANEL
Carboxyhemoglobin: 1.7 % — ABNORMAL HIGH (ref 0.5–1.5)
Methemoglobin: <0.7 % (ref 0.0–1.5)
O2 Saturation: 65 %
Total hemoglobin: 15.5 g/dL (ref 12.0–16.0)

## 2023-07-14 LAB — POCT ACTIVATED CLOTTING TIME
Activated Clotting Time: 195 s
Activated Clotting Time: 128 s
Activated Clotting Time: 189 s
Activated Clotting Time: 226 s
Activated Clotting Time: 250 s

## 2023-07-14 LAB — MAGNESIUM: Magnesium: 2.4 mg/dL (ref 1.7–2.4)

## 2023-07-14 LAB — GLUCOSE, CAPILLARY
Glucose-Capillary: 196 mg/dL — ABNORMAL HIGH (ref 70–99)
Glucose-Capillary: 130 mg/dL — ABNORMAL HIGH (ref 70–99)

## 2023-07-14 MED ORDER — PERFLUTREN LIPID MICROSPHERE
1.0000 mL | INTRAVENOUS | Status: AC | PRN
  Administered 2023-07-14: 3 mL via INTRAVENOUS

## 2023-07-14 NOTE — Progress Notes (Signed)
  Echocardiogram 2D Echocardiogram has been performed.  Christopher Ball [name] 07/14/2023, 2:59 PM

## 2023-07-14 NOTE — TOC Benefit Eligibility Note (Signed)
Patient Product/process development scientist completed.    The patient is insured through [employer]. Patient has [employer], may use a copay card, and/or apply for patient assistance if available.    Ran test claim for Eliquis 5 mg and the current 30 day co-pay is $60.00 .   This test claim was processed through [facility]- copay amounts may vary at other pharmacies due to pharmacy/plan contracts, or as the patient moves through the different stages of their insurance plan.     Roland Earl, CPHT Pharmacy Technician III Certified Patient Advocate [facility] Patient Advocate Team Direct Number: [phone]  Fax: [phone]

## 2023-07-14 NOTE — Progress Notes (Signed)
    Advanced Heart Failure Rounding Note  PCP-Cardiologist: Nanetta Batty, MD   Subjective:    10/27/ S/P TAVR Echo Ef 35-40% No regurgitation or paravalvular leak.  CO-OX 65%. Off all drips.    Feels great! Denies SOB.    Objective:   Weight Range: 57.8 kg Body mass index is 14.73 kg/m.   Vital Signs:   Temp:  [97.5 F (36.4 C)-98.2 F (36.8 C)] 98.2 F (36.8 C) (10/27 2100) Pulse Rate:  [51-127] 90 (10/28 0700) Resp:  [12-33] 12 (10/28 0700) BP: (124)/(47) 124/47 (10/27 0745) SpO2:  [89 %-99 %] 93 % (10/28 0700) Arterial Line BP: (111-159)/(34-86) 126/77 (10/28 0500) Weight:  [57.8 kg] 57.8 kg (10/28 0613) Last BM Date : 07/11/23  Weight change: Filed Weights   07/12/23 0446 07/13/23 0430 07/14/23 0613  Weight: 68 kg 61.6 kg 57.8 kg    Intake/Output:   Intake/Output Summary (Last 24 hours) at 07/14/2023 0731 Last data filed at 07/14/2023 0601 Gross per 24 hour  Intake 1371.5 ml  Output 5101 ml  Net -3729.5 ml    CVP 1  Physical Exam    General:  In the chair. No resp difficulty HEENT: normal Neck: supple. no JVD. Carotids 2+ bilat; no bruits. No lymphadenopathy or thryomegaly appreciated. Cor: PMI nondisplaced. Regular rate & rhythm. No rubs, gallops or murmurs. Lungs: clear on room air.  Abdomen: soft, nontender, nondistended. No hepatosplenomegaly. No bruits or masses. Good bowel sounds. Extremities: no cyanosis, clubbing, rash, edema. RUE PICC  Neuro: alert & orientedx3, cranial nerves grossly intact. moves all 4 extremities w/o difficulty. Affect pleasant    Telemetry   SR  Patient Profile   Patient is a 52 y.o. male with a history of Marfan's syndrome, PAPVR, ASD, aortic root replacement with graft and medtronic freestyle graft placement who presented for scheduled RHC/LHC as part of TAVR workup. Now with worsening cardiogenic shock in the setting of severe AR.  S/P TAVR  Assessment/Plan   1. Severe Aortic Insufficiency now with  Cardiogenic Shock: s/p aortic root replacement with Medtronic 27 mm aortic valve and ascending aortic replacement with a 24 mm Hemashield graft with coronary artery reimplantation in 2007. 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 Lactate peaked at 4.5, required dobutamine and norepinephrine to maintain MAP. Pre TAVR  Severe Aortic Stenosis.  Echo LVEF 40-45%  - -10/27 S/P TAVR Echo Ef 35-40% No regurgitation or paravalvular leak.  -Off all drips. CO-OX 65% . Remove A line.  - Hypovolemic. Diuresed 21 pounds. CVP 1. Hold diuretics. Encouraged PO intake.  - No BB for now.  - Hold off on MRA/SGLT2i - Continue hydralazine for now. Anticipate switching losartan tomorrow.  - Repeat Echo today.   2. AKI on CKD: ATN in the setting of valvular shock. - Trending down.   3. Elevated liver enzymes: Congestive with improving shock. -Continues to improve.   4. Marfan's Syndrome - s/p aortic root replacement with Medtronic 27 mm aortic valve and ascending aortic replacement with a 24 mm Hemashield graft with coronary artery reimplantation in 2007  Ambulate. Should be able to move out of the unit.    Length of Stay: 4  Shirlyn Savin, NP  07/14/2023, 7:31 AM  Advanced Heart Failure Team Pager [phone] (M-F; 7a - 5p)  Please contact [facility] for night-coverage after hours (5p -7a ) and weekends on [email or web address]

## 2023-07-14 NOTE — Progress Notes (Signed)
Patient brought to 4E from 2H. VSS. Telemetry box applied, CCMD notified. Patient oriented to room and staff. Call bell in reach. ? ?Tallon Gertz L Muna Demers, RN  ?

## 2023-07-14 NOTE — TOC Progression Note (Signed)
Transition of Care [facility]) - Progression Note    Patient Details  Name: Christopher Ball MRN: [id number] Date of Birth: [birth year]  Transition of [facility] CM/SW Contact  Nicanor Bake Phone Number: [phone] 07/14/2023, 1:05 PM  Clinical Narrative:   [facility] CSW called to schedule pts Hospital follow up appointment schedule for Friday, July 18, 2023 at 2:00 PM.     Expected Discharge Plan: Home/Self Care Barriers to Discharge: Continued Medical Work up  Expected Discharge Plan and Services       Living arrangements for the past 2 months: Single Family Home                                       Social Determinants of Health (SDOH) Interventions SDOH Screenings   Food Insecurity: No Food Insecurity (07/11/2023)  Housing: Low Risk  (07/11/2023)  Transportation Needs: No Transportation Needs (07/11/2023)  Utilities: Not At Risk (07/11/2023)  Alcohol Screen: Low Risk  (07/18/2022)  Depression (PHQ2-9): Low Risk  (01/16/2023)  Tobacco Use: High Risk (07/11/2023)    Readmission Risk Interventions     No data to display

## 2023-07-14 NOTE — Progress Notes (Addendum)
PHARMACY - ANTICOAGULATION CONSULT NOTE  Pharmacy Consult for Eliquis Indication:  Valve in Valve TAVR  Allergies  Allergen Reactions   Augmentin [Amoxicillin-Pot Clavulanate] Shortness Of Breath and Nausea And Vomiting    Patient Measurements: Height: 6\' 6"  (198.1 cm) Weight: 57.8 kg (127 lb 6.8 oz) IBW/kg (Calculated) : 91.4  Vital Signs: Temp: 97.7 F (36.5 C) (10/28 0822) Temp Source: Oral (10/28 0822) BP: 107/87 (10/28 0900) Pulse Rate: 88 (10/28 0900)  Labs: Recent Labs    07/12/23 0422 07/12/23 1056 07/13/23 0249 07/13/23 0518 07/13/23 1243 07/14/23 0421  HGB 12.3*  --  12.1* 13.6  --  15.0  HCT 37.2*  --  35.4* 40.0  --  45.3  PLT 160  --  151  --   --  146*  APTT  --   --  28  --   --   --   LABPROT  --   --  16.5*  --   --   --   INR  --   --  1.3*  --   --   --   CREATININE 3.56*  3.58*   < > 2.84*  --  2.31* 1.91*   < > = values in this interval not displayed.    Estimated Creatinine Clearance: 37.4 mL/min (A) (by C-G formula based on SCr of 1.91 mg/dL (H)).   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 Lynnette Caffey & Dr. Laneta Simmers   Stomach problems    Thoracic aortic aneurysm [facility])    replacement by Dr. [name] /30/07 with aortic bioprosthetic valve   Tobacco abuse     Medications:  Scheduled:   Chlorhexidine Gluconate Cloth  6 each Topical Daily   heparin  5,000 Units Subcutaneous Q8H   hydrALAZINE  25 mg Oral Q8H   loratadine  10 mg Oral Daily   mupirocin ointment  1 Application Nasal BID   pantoprazole  40 mg Oral Daily   polyethylene glycol  17 g Oral Daily   senna-docusate  1 tablet Oral QHS   sodium chloride flush  3 mL Intravenous Q12H    Assessment: 52 YOM admitted for evaluation of severe bioprosthetic aortic insufficiency. Patient had urgent transcatheter aortic  valve replacement on 10/27 for the indication of severe bioprosthetic aortic valve degeneration with sever aortic insufficiency and cardiogenic shock. Post-op patient started on plavix, received first dose today, 10/28. Pharmacy consulted to start the patient on Eliquis per MD request.   10/28: Patient weighs 57.8 kg today, down from 67.1 kg on admission. Scr improved to 1.91 mg/dL today, BL Scr is ~9.1 mg/dL. Based on weight (<60 kg) and renal function (Scr >1.5 mg/dL), patient currently meets criteria for dose reduction of Eliquis based on dosing in the ATLANTIS trial.   Goal of Therapy:  Monitor platelets by anticoagulation protocol: Yes   Plan:  Start Eliquis 2.5 mg PO BID tomorrow, 10/29, and continue for 3 months Monitor renal function and weight to ensure Eliquis dosing remains appropriate Stop aspirin, resume in 3 months after completing 60-month course of Eliquis Stop clopidogrel Monitor CBC and s/sx of bleeding  Enos Fling, PharmD PGY-1 Acute Care Pharmacy Resident 07/14/2023 9:41 AM

## 2023-07-14 NOTE — Progress Notes (Addendum)
[facility]   MULTIDISCIPLINARY HEART VALVE TEAM  Patient Name: Christopher Ball Date of Encounter: 07/14/2023  Admit date: 07/10/2023  Primary Care Provider: Lorre Munroe, NP [facility] Cardiologist: Nanetta Batty, MD  [facility] Electrophysiologist:  None   [facility] Problem List     Principal Problem:   Acute on chronic diastolic (congestive) heart failure (HCC) Active Problems:   Marfan syndrome   Underweight   Emphysema lung (HCC)   Severe aortic regurgitation   H/O aortic valve replacement   AKI (acute kidney injury) (HCC)   Aortic aneurysm of unspecified site, without rupture (HCC)     Subjective   Feeling great with no complaints. Lost 20 lbs  Inpatient Medications    Scheduled Meds:  aspirin  81 mg Oral Daily   Chlorhexidine Gluconate Cloth  6 each Topical Daily   clopidogrel  75 mg Oral Q breakfast   heparin  5,000 Units Subcutaneous Q8H   hydrALAZINE  25 mg Oral Q8H   loratadine  10 mg Oral Daily   mupirocin ointment  1 Application Nasal BID   pantoprazole  40 mg Oral Daily   polyethylene glycol  17 g Oral Daily   senna-docusate  1 tablet Oral QHS   sodium chloride flush  3 mL Intravenous Q12H   Continuous Infusions:  PRN Meds: acetaminophen **OR** acetaminophen, acetaminophen, metoprolol tartrate, morphine injection, ondansetron (ZOFRAN) IV, mouth rinse, oxyCODONE, traMADol   Vital Signs    Vitals:   07/14/23 0500 07/14/23 0600 07/14/23 0613 07/14/23 0700  BP:      Pulse: 86   90  Resp: (!) 21 (!) 25  12  Temp:      TempSrc:      SpO2: 93%   93%  Weight:   57.8 kg   Height:   6\' 6"  (1.981 m)     Intake/Output Summary (Last 24 hours) at 07/14/2023 0817 Last data filed at 07/14/2023 0601 Gross per 24 hour  Intake 1299.19 ml  Output 4851 ml  Net -3551.81 ml   Filed Weights   07/12/23 0446 07/13/23 0430 07/14/23 0613  Weight: 68 kg 61.6 kg 57.8 kg    Physical Exam    GEN:  Very thin, tall white  male HEENT: Grossly normal.  Neck: Supple, no JVD, +carotid bruits. Cardiac: RRR, no murmurs, rubs, or gallops. No clubbing, cyanosis, edema.   Respiratory:  Respirations regular and unlabored, clear to auscultation bilaterally. GI: Soft, nontender, nondistended, BS + x 4. MS: no deformity or atrophy. Skin: warm and dry, no rash.  Groin sites clear without hematoma or ecchymosis  Neuro:  Strength and sensation are intact. Psych: AAOx3.  Normal affect.  Labs    CBC Recent Labs    07/13/23 0249 07/13/23 0518 07/14/23 0421  WBC 11.1*  --  12.4*  HGB 12.1* 13.6 15.0  HCT 35.4* 40.0 45.3  MCV 95.4  --  96.4  PLT 151  --  146*   Basic Metabolic Panel Recent Labs    40/98/11 1243 07/13/23 1851 07/14/23 0421  NA 136  --  136  K 3.1* 4.1 4.0  CL 90*  --  90*  CO2 35*  --  31  GLUCOSE 128*  --  114*  BUN 77*  --  62*  CREATININE 2.31*  --  1.91*  CALCIUM 9.0  --  9.6  MG  --   --  2.4   Liver Function Tests Recent Labs    07/13/23 0249 07/14/23  0421  AST 468* 214*  ALT 721* 552*  ALKPHOS 126 162*  BILITOT 1.3* 1.6*  PROT 6.5 7.5  ALBUMIN 3.4* 3.7   No results for input(s): "LIPASE", "AMYLASE" in the last 72 hours. Cardiac Enzymes No results for input(s): "CKTOTAL", "CKMB", "CKMBINDEX", "TROPONINI" in the last 72 hours. BNP Invalid input(s): "POCBNP" D-Dimer No results for input(s): "DDIMER" in the last 72 hours. Hemoglobin A1C Recent Labs    07/13/23 0249  HGBA1C 5.6   Fasting Lipid Panel No results for input(s): "CHOL", "HDL", "LDLCALC", "TRIG", "CHOLHDL", "LDLDIRECT" in the last 72 hours. Thyroid Function Tests No results for input(s): "TSH", "T4TOTAL", "T3FREE", "THYROIDAB" in the last 72 hours.  Invalid input(s): "FREET3"  Telemetry    sinus - Personally Reviewed  ECG    Sinus with LVH and repol abnormalities. PVCs/PACs. HR 95  - Personally Reviewed  Radiology    DG Chest Port 1 View  Result Date: 07/13/2023 CLINICAL DATA:  Aortic valve  stenosis. EXAM: PORTABLE CHEST 1 VIEW COMPARISON:  Chest radiograph dated 07/11/2023 FINDINGS: The heart size and mediastinal contours are within normal limits. A small left pleural effusion with associated atelectasis/airspace disease has decreased since 07/11/2023. The right lung is clear. No pneumothorax on either side. A right peripherally inserted central venous catheter tip overlies the superior cavoatrial junction. IMPRESSION: Small left pleural effusion with associated atelectasis/airspace disease has decreased since 07/11/2023. Electronically Signed   By: Romona Curls M.D.   On: 07/13/2023 13:02   ECHOCARDIOGRAM LIMITED  Result Date: 07/13/2023    ECHOCARDIOGRAM LIMITED REPORT   Patient Name:   Christopher Ball [facility] Date of Exam: 07/13/2023 Medical Rec #:  [id number]        Height:       78.0 in Accession #:    [id number]       Weight:       135.8 lb Date of Birth:  29-Apr-1971        BSA:          1.915 m Patient Age:    52 years         BP:           129/40 mmHg Patient Gender: M                HR:           83 bpm. Exam Location:  Inpatient Procedure: Limited Echo, Color Doppler, Cardiac Doppler and Echo Assisted            Procedure Indications:     TAVR implantation  History:         Patient has prior history of Echocardiogram examinations, most                  recent 07/11/2023. 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 Surgical AVR is a 27mm Freestyle                  homograft placed in 2007; TAVR valve is a 26mm Sapien 3 Ultra.  Aortic Valve: 26 mm Sapien prosthetic, stented (TAVR) valve is                  present in the aortic position. Procedure Date: 07/13/2023.  Sonographer:     [facility] Referring Phys:  [id number] Janetta Hora Diagnosing Phys: Lennie Odor MD IMPRESSIONS  1. Echo guided TAVR. 26 mm  S3 deployed in failed 27 mm Freestyle aortic valve prosthesis. Post deployment Vmax 1.6 m/s, MG 5 mmHH, EOA 2.11 cm2. No regurgitation or paravalvular leak. No immediate complications. EF 35-40%. There is a 26 mm Sapien prosthetic (TAVR) valve present in the aortic position. Procedure Date: 07/13/2023.  2. Left ventricular ejection fraction, by estimation, is 35 to 40%. The left ventricle has moderately decreased function. The left ventricle demonstrates global hypokinesis.  3. Right ventricular systolic function is mildly reduced. The right ventricular size is moderately enlarged. FINDINGS  Left Ventricle: Left ventricular ejection fraction, by estimation, is 35 to 40%. The left ventricle has moderately decreased function. The left ventricle demonstrates global hypokinesis. Right Ventricle: The right ventricular size is moderately enlarged. Right ventricular systolic function is mildly reduced. Pericardium: There is no evidence of pericardial effusion. Aortic Valve: Echo guided TAVR. 26 mm S3 deployed in failed 27 mm Freestyle aortic valve prosthesis. Post deployment Vmax 1.6 m/s, MG 5 mmHH, EOA 2.11 cm2. No regurgitation or paravalvular leak. No immediate complications. EF 35-40%. Aortic regurgitation  PHT measures 156 msec. Aortic valve mean gradient measures 5.5 mmHg. Aortic valve peak gradient measures 10.8 mmHg. Aortic valve area, by VTI measures 2.11 cm. There is a 26 mm Sapien prosthetic, stented (TAVR) valve present in the aortic position. Procedure Date: 07/13/2023. Additional Comments: Spectral Doppler performed. Color Doppler performed.  LEFT VENTRICLE PLAX 2D LVOT diam:     2.30 cm LV SV:         65 LV SV Index:   34 LVOT Area:     4.15 cm  AORTIC VALVE AV Area (Vmax):    2.32 cm AV Area (Vmean):   1.05 cm AV Area (VTI):     2.11 cm AV Vmax:           164.50 cm/s AV Vmean:          220.600 cm/s AV VTI:            0.309 m AV Peak Grad:      10.8 mmHg AV Mean Grad:      5.5 mmHg LVOT Vmax:          91.70 cm/s LVOT Vmean:        55.500 cm/s LVOT VTI:          0.157 m LVOT/AV VTI ratio: 0.51 AI PHT:            156 msec  SHUNTS Systemic VTI:  0.16 m Systemic Diam: 2.30 cm Lennie Odor MD Electronically signed by Lennie Odor MD Signature Date/Time: 07/13/2023/12:19:36 PM    Final    Structural Heart Procedure  Result Date: 07/13/2023 See surgical note for result.   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   Postoperative Diagnosis:    Same    Procedure:        Transcatheter Aortic Valve Replacement - Transfemoral  Approach             Edwards Sapien 3 Resilia THV (size 26 mm, model # [id number])   Cerebral embolic protection (via right ulnar artery)              Co-Surgeons:                         Alleen Borne, MD and Alverda Skeans, MD Anesthesiologist:                  Laurene Footman, MD   Echocardiographer:              Lennie Odor, MD   Pre-operative Echo Findings: Severe bioprosthetic aortic valve degeneration Cardiogenic shock Moderate left ventricular systolic dysfunction   Post-operative Echo Findings: No paravalvular leak Moderate left ventricular systolic dysfunction   Left Heart Catheterization Findings: Left ventricular end-diastolic pressure of  Patient Profile     Christopher Ball is a 52 y.o. 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 Plan for Eliquis 5mg  BID x 3 months followed by aspirin 81mg  daily indefinitely.  POD1 echo to be completed today. Plan to transfer to floor today.   Chronic HFrEF: EF 35-40%. Diuresed 21 pounds. CVP 1. Hold diuretics. Encouraged PO intake. Will let renal function come down and then titrate GDMT.  Cardiogenic shock: lactate peaked at 4.5, required dobutamine and norepinephrine to maintain MAP.  Now off all gtts.   AKI: creat peaked at 3.58. Trending down -1.91 today.   Elevated LFTs: due to congestion. Improving   Signed, Cline Crock, PA-C  07/14/2023, 8:17 AM  Pager [phone]   ATTENDING ATTESTATION:  After conducting a review of all available clinical information with the care team, interviewing the patient, and performing a physical exam, I agree with the findings and plan described in this note.   GEN: No acute distress.   HEENT:  MMM, no JVD, no scleral icterus Cardiac: RRR, with 2/6 systolic murmur and no diastolic murmurs Respiratory: Clear to auscultation bilaterally. GI: Soft, nontender, non-distended  MS: No edema; No deformity. Neuro:  Nonfocal  Vasc: Absent right radial and very weak ulnar pulse; left radial arterial line  Patient is doing very well and is without significant complaints today other than trouble sleeping due to chronic back pain.  Dobutamine and norepinephrine have been discontinued.  His cooximetry levels are reasonable.  He is breathing well on room air.  His creatinine continues to improve.  Very much appreciate advanced heart failure management over the weekend.  Will transfer to the floor today.  Will start goal-directed medical therapy tomorrow as creatinine improves.  I suspect that his EF will improve.  Given valve in valve configuration will plan on treating with Eliquis 5 mg  twice daily for 3 months then aspirin monotherapy indefinitely.  CRITICAL CARE Performed by: Orbie Pyo  Total critical care time: 30 minutes  Critical care time was exclusive of separately billable procedures and treating other patients.  Critical care was necessary to treat or prevent imminent or life-threatening deterioration.  Critical care was time spent personally by me on the following activities: development of treatment plan with patient and/or surrogate as well as nursing, discussions with consultants, evaluation of patient's response to treatment, examination of patient, obtaining history from patient or surrogate, ordering and performing treatments and interventions, ordering and review of laboratory studies, ordering and review of radiographic studies, pulse oximetry and re-evaluation of patient's condition.   Alverda Skeans, MD Pager [phone]

## 2023-07-15 ENCOUNTER — Encounter (HOSPITAL_COMMUNITY): Payer: Self-pay

## 2023-07-15 ENCOUNTER — Inpatient Hospital Stay (HOSPITAL_COMMUNITY): Admit: 2023-07-15 | Payer: Federal, State, Local not specified - PPO | Admitting: Internal Medicine

## 2023-07-15 DIAGNOSIS — R570 Cardiogenic shock: Secondary | ICD-10-CM

## 2023-07-15 DIAGNOSIS — I350 Nonrheumatic aortic (valve) stenosis: Secondary | ICD-10-CM

## 2023-07-15 DIAGNOSIS — I5033 Acute on chronic diastolic (congestive) heart failure: Secondary | ICD-10-CM | POA: Diagnosis not present

## 2023-07-15 LAB — COMPREHENSIVE METABOLIC PANEL
ALT: 315 U/L — ABNORMAL HIGH (ref 0–44)
AST: 140 U/L — ABNORMAL HIGH (ref 15–41)
Albumin: 3.3 g/dL — ABNORMAL LOW (ref 3.5–5.0)
Alkaline Phosphatase: 150 U/L — ABNORMAL HIGH (ref 38–126)
Anion gap: 12 (ref 5–15)
BUN: 48 mg/dL — ABNORMAL HIGH (ref 6–20)
CO2: 28 mmol/L (ref 22–32)
Calcium: 9.3 mg/dL (ref 8.9–10.3)
Chloride: 97 mmol/L — ABNORMAL LOW (ref 98–111)
Creatinine, Ser: 1.41 mg/dL — ABNORMAL HIGH (ref 0.61–1.24)
GFR, Estimated: >60 mL/min (ref >60)
Glucose, Bld: 109 mg/dL — ABNORMAL HIGH (ref 70–99)
Potassium: 4.2 mmol/L (ref 3.5–5.1)
Sodium: 137 mmol/L (ref 135–145)
Total Bilirubin: 1.2 mg/dL (ref 0.3–1.2)
Total Protein: 6.8 g/dL (ref 6.5–8.1)

## 2023-07-15 SURGERY — TRANSCATHETER AORTIC VALVE REPLACEMENT, TRANSFEMORAL (CATHLAB)
Anesthesia: Monitor Anesthesia Care

## 2023-07-15 MED ORDER — METOPROLOL SUCCINATE ER 25 MG PO TB24
25.0000 mg | ORAL_TABLET | Freq: Every day | ORAL | Status: DC
  Administered 2023-07-15: 25 mg via ORAL
  Filled 2023-07-15: qty 1

## 2023-07-15 MED ORDER — APIXABAN 2.5 MG PO TABS
2.5000 mg | ORAL_TABLET | Freq: Two times a day (BID) | ORAL | Status: DC
  Administered 2023-07-15: 2.5 mg via ORAL
  Filled 2023-07-15: qty 1

## 2023-07-15 MED ORDER — EMPAGLIFLOZIN 10 MG PO TABS
10.0000 mg | ORAL_TABLET | Freq: Every day | ORAL | Status: DC
Start: 2023-07-15 — End: 2023-07-16
  Administered 2023-07-15 – 2023-07-16 (×2): 10 mg via ORAL
  Filled 2023-07-15 (×2): qty 1

## 2023-07-15 MED ORDER — LOSARTAN POTASSIUM 25 MG PO TABS
25.0000 mg | ORAL_TABLET | Freq: Every day | ORAL | Status: DC
  Administered 2023-07-15 – 2023-07-16 (×2): 25 mg via ORAL
  Filled 2023-07-15 (×2): qty 1

## 2023-07-15 MED ORDER — APIXABAN 5 MG PO TABS
5.0000 mg | ORAL_TABLET | Freq: Two times a day (BID) | ORAL | Status: DC
  Administered 2023-07-15 – 2023-07-16 (×2): 5 mg via ORAL
  Filled 2023-07-15 (×2): qty 1

## 2023-07-15 MED FILL — Fentanyl Citrate Preservative Free (PF) Inj 100 MCG/2ML: INTRAMUSCULAR | Qty: 2 | Status: AC

## 2023-07-15 NOTE — Discharge Instructions (Addendum)
ACTIVITY AND EXERCISE  Daily activity and exercise are an important part of your recovery. People recover at different rates depending on their general health and type of valve procedure.  Most people recovering from TAVR feel better relatively quickly   No lifting, pushing, pulling more than 10 pounds (examples to avoid: groceries, vacuuming, gardening, golfing):             - For one week with a procedure through the groin.             - For six weeks for procedures through the chest wall or neck. NOTE: You will typically see one of our providers 7-14 days after your procedure to discuss WHEN TO RESUME the above activities.      DRIVING  Do not drive until you are seen for follow up and cleared by a provider. Generally, we ask patient to not drive for 1 week after their procedure.  If you have been told by your doctor in the past that you may not drive, you must talk with him/her before you begin driving again.   DRESSING  Groin site: you may leave the clear dressing over the site for up to one week or until it falls off.   HYGIENE  If you had a femoral (leg) procedure, you may take a shower when you return home. After the shower, pat the site dry. Do NOT use powder, oils or lotions in your groin area until the site has completely healed.  If you had a chest procedure, you may shower when you return home unless specifically instructed not to by your discharging practitioner.             - DO NOT scrub incision; pat dry with a towel.             - DO NOT apply any lotions, oils, powders to the incision.             - No tub baths / swimming for at least 2 weeks.  If you notice any fevers, chills, increased pain, swelling, bleeding or pus, please contact your doctor.   ADDITIONAL INFORMATION  If you are going to have an upcoming dental procedure, please contact our office as you will require antibiotics ahead of time to prevent infection on your heart valve.    If you have any questions  or concerns you can call the structural heart phone during normal business hours 8am-4pm. If you have an urgent need after hours or weekends please call [phone] to talk to the on call provider for general cardiology. If you have an emergency that requires immediate attention, please call 911.    After TAVR Checklist  Check  Test Description   Follow up appointment in 1-2 weeks  You will see our structural heart advanced practice provider. Your incision sites will be checked and you will be cleared to drive and resume all normal activities if you are doing well.     1 month echo and follow up  You will have an echo to check on your new heart valve and be seen back in the office by a structural heart advanced practice provider.   Follow up with your primary cardiologist You will need to be seen by your primary cardiologist in the following 3-6 months after your 1 month appointment in the valve clinic.    1 year echo and follow up You will have another echo to check on your heart valve after 1 year  and be seen back in the office by a structural heart advanced practice provider. This your last structural heart visit.   Bacterial endocarditis prophylaxis  You will have to take antibiotics for the rest of your life before all dental procedures (even teeth cleanings) to protect your heart valve. Antibiotics are also required before some surgeries. Please check with your cardiologist before scheduling any surgeries. Also, please make sure to tell us if you have a penicillin allergy as you will require an alternative antibiotic.     ________________________________________________________________________________________________________________________________________________________ Information on my medicine - ELIQUIS (apixaban)  This medication education was reviewed with me or my healthcare representative as part of my discharge preparation.  Why was Eliquis prescribed for you? Eliquis was  prescribed for you to reduce the risk of a blood clot forming that can cause a stroke due to the recent aortic valve replacement surgery you had during admission.  What do You need to know about Eliquis ? Take your Eliquis TWICE DAILY - one tablet in the morning and one tablet in the evening with or without food. If you have difficulty swallowing the tablet whole please discuss with your pharmacist how to take the medication safely.  Take Eliquis exactly as prescribed by your doctor and DO NOT stop taking Eliquis without talking to the doctor who prescribed the medication.  Stopping may increase your risk of developing a stroke.  Refill your prescription before you run out.  After discharge, you should have regular check-up appointments with your healthcare provider that is prescribing your Eliquis.  In the future your dose may need to be changed if your kidney function or weight changes by a significant amount or as you get older.  What do you do if you miss a dose? If you miss a dose, take it as soon as you remember on the same day and resume taking twice daily.  Do not take more than one dose of ELIQUIS at the same time to make up a missed dose.  Important Safety Information A possible side effect of Eliquis is bleeding. You should call your healthcare provider right away if you experience any of the following: Bleeding from an injury or your nose that does not stop. Unusual colored urine (red or dark brown) or unusual colored stools (red or black). Unusual bruising for unknown reasons. A serious fall or if you hit your head (even if there is no bleeding).  Some medicines may interact with Eliquis and might increase your risk of bleeding or clotting while on Eliquis. To help avoid this, consult your healthcare provider or pharmacist prior to using any new prescription or non-prescription medications, including herbals, vitamins, non-steroidal anti-inflammatory drugs (NSAIDs) and  supplements.  This website has more information on Eliquis (apixaban): http://www.eliquis.com/eliquis/home

## 2023-07-15 NOTE — Progress Notes (Addendum)
BILITOT 1.6* 1.2  PROT 7.5 6.8  ALBUMIN 3.7 3.3*   No results for input(s): "LIPASE", "AMYLASE" in the last 72 hours. Cardiac Enzymes No results for input(s): "CKTOTAL", "CKMB", "CKMBINDEX", "TROPONINI" in the last 72 hours. BNP Invalid input(s): "POCBNP" D-Dimer No results for input(s): "DDIMER" in the last 72 hours. Hemoglobin A1C Recent Labs    07/13/23 0249  HGBA1C 5.6   Fasting Lipid Panel No results for input(s): "CHOL", "HDL", "LDLCALC", "TRIG", "CHOLHDL", "LDLDIRECT" in the last 72 hours. Thyroid Function Tests No results for input(s): "TSH", "T4TOTAL", "T3FREE", "THYROIDAB" in the last 72 hours.  Invalid input(s): "FREET3"  Telemetry    sinus - Personally Reviewed  ECG    Sinus with LVH and repol abnormalities. PVCs/PACs. HR 95  - Personally Reviewed  Radiology    ECHOCARDIOGRAM COMPLETE  Result  Date: 07/14/2023    ECHOCARDIOGRAM REPORT   Patient Name:   Christopher Ball [facility] Date of Exam: 07/14/2023 Medical Rec #:  [id number]        Height:       78.0 in Accession #:    [id number]       Weight:       127.4 lb Date of Birth:  12-04-1970        BSA:          1.864 m Patient Age:    52 years         BP:           159/86 mmHg Patient Gender: M                HR:           93 bpm. Exam Location:  Inpatient Procedure: 2D Echo, Cardiac Doppler, Color Doppler, Strain Analysis and            Intracardiac Opacification Agent Indications:    Post TAVR evaluation  History:        Patient has no prior history of Echocardiogram examinations and                 Patient has prior history of Echocardiogram examinations, most                 recent 06/05/2023. Prior Cardiac Surgery; Arrythmias:PAC.                 Aortic Valve: 26 mm Sapien prosthetic, stented (TAVR) valve is                 present in the aortic position. Procedure Date: 07/13/23.  Sonographer:    Karma Ganja Referring Phys: [id number] Orbie Pyo  Sonographer Comments: Image acquisition challenging due to patient body habitus. Global longitudinal strain was attempted. IMPRESSIONS  1. Left ventricular ejection fraction, by estimation, is 25 to 30%. The left ventricle has severely decreased function. The left ventricle demonstrates global hypokinesis. Left ventricular diastolic parameters are consistent with Grade I diastolic dysfunction (impaired relaxation).  2. Right ventricular systolic function is moderately reduced. The right ventricular size is mildly enlarged.  3. Right atrial size was mildly dilated.  4. The mitral valve is normal in structure. Trivial mitral valve regurgitation.  5. The aortic valve has been repaired/replaced. Aortic valve regurgitation is not visualized. There is a 26 mm Sapien prosthetic (TAVR) valve present in the aortic position. Procedure Date: 07/13/23.  6. Aortic root/ascending aorta has been repaired/replaced. Comparison(s):  Changes from prior study are noted. LVEF is significantly worse. TAVR valve in place with no paravalvular  cm/s AV Vmean:          132.000 cm/s AV VTI:            0.309 m AV Peak Grad:      16.6 mmHg AV Mean Grad:      8.0 mmHg LVOT Vmax:         83.90 cm/s LVOT Vmean:        50.700 cm/s LVOT VTI:          0.121 m LVOT/AV VTI ratio: 0.39  AORTA Ao Root diam: 2.30 cm Ao Asc diam:  2.10 cm MITRAL VALVE MV Area (PHT): 3.85 cm    SHUNTS MV Decel Time: 197 msec    Systemic VTI:  0.12 m MV E velocity: 52.80 cm/s  Systemic Diam: 2.40 cm MV A velocity: 54.80 cm/s MV E/A ratio:  0.96 Clearnce Hasten Electronically signed by Clearnce Hasten Signature Date/Time: 07/14/2023/3:59:05 PM    Final    DG Chest Port 1 View  Result Date: 07/13/2023 CLINICAL DATA:  Aortic valve stenosis. EXAM: PORTABLE CHEST 1 VIEW COMPARISON:  Chest radiograph dated 07/11/2023 FINDINGS: The heart size and mediastinal contours are within normal limits. A small left pleural effusion with associated atelectasis/airspace disease has decreased since 07/11/2023. The right lung is clear. No pneumothorax on either side. A right peripherally inserted central venous catheter tip overlies the superior cavoatrial junction.  IMPRESSION: Small left pleural effusion with associated atelectasis/airspace disease has decreased since 07/11/2023. Electronically Signed   By: Romona Curls M.D.   On: 07/13/2023 13:02   ECHOCARDIOGRAM LIMITED  Result Date: 07/13/2023    ECHOCARDIOGRAM LIMITED REPORT   Patient Name:   Christopher Ball [facility] Date of Exam: 07/13/2023 Medical Rec #:  [id number]        Height:       78.0 in Accession #:    [id number]       Weight:       135.8 lb Date of Birth:  Feb 21, 1971        BSA:          1.915 m Patient Age:    52 years         BP:           129/40 mmHg Patient Gender: M                HR:           83 bpm. Exam Location:  Inpatient Procedure: Limited Echo, Color Doppler, Cardiac Doppler and Echo Assisted            Procedure Indications:     TAVR implantation  History:         Patient has prior history of Echocardiogram examinations, most                  recent 07/11/2023. 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 Surgical AVR is a 27mm Freestyle                  homograft placed in 2007; TAVR valve is a 26mm Sapien 3 Ultra.  BILITOT 1.6* 1.2  PROT 7.5 6.8  ALBUMIN 3.7 3.3*   No results for input(s): "LIPASE", "AMYLASE" in the last 72 hours. Cardiac Enzymes No results for input(s): "CKTOTAL", "CKMB", "CKMBINDEX", "TROPONINI" in the last 72 hours. BNP Invalid input(s): "POCBNP" D-Dimer No results for input(s): "DDIMER" in the last 72 hours. Hemoglobin A1C Recent Labs    07/13/23 0249  HGBA1C 5.6   Fasting Lipid Panel No results for input(s): "CHOL", "HDL", "LDLCALC", "TRIG", "CHOLHDL", "LDLDIRECT" in the last 72 hours. Thyroid Function Tests No results for input(s): "TSH", "T4TOTAL", "T3FREE", "THYROIDAB" in the last 72 hours.  Invalid input(s): "FREET3"  Telemetry    sinus - Personally Reviewed  ECG    Sinus with LVH and repol abnormalities. PVCs/PACs. HR 95  - Personally Reviewed  Radiology    ECHOCARDIOGRAM COMPLETE  Result  Date: 07/14/2023    ECHOCARDIOGRAM REPORT   Patient Name:   Christopher Ball [facility] Date of Exam: 07/14/2023 Medical Rec #:  [id number]        Height:       78.0 in Accession #:    [id number]       Weight:       127.4 lb Date of Birth:  12-04-1970        BSA:          1.864 m Patient Age:    52 years         BP:           159/86 mmHg Patient Gender: M                HR:           93 bpm. Exam Location:  Inpatient Procedure: 2D Echo, Cardiac Doppler, Color Doppler, Strain Analysis and            Intracardiac Opacification Agent Indications:    Post TAVR evaluation  History:        Patient has no prior history of Echocardiogram examinations and                 Patient has prior history of Echocardiogram examinations, most                 recent 06/05/2023. Prior Cardiac Surgery; Arrythmias:PAC.                 Aortic Valve: 26 mm Sapien prosthetic, stented (TAVR) valve is                 present in the aortic position. Procedure Date: 07/13/23.  Sonographer:    Karma Ganja Referring Phys: [id number] Orbie Pyo  Sonographer Comments: Image acquisition challenging due to patient body habitus. Global longitudinal strain was attempted. IMPRESSIONS  1. Left ventricular ejection fraction, by estimation, is 25 to 30%. The left ventricle has severely decreased function. The left ventricle demonstrates global hypokinesis. Left ventricular diastolic parameters are consistent with Grade I diastolic dysfunction (impaired relaxation).  2. Right ventricular systolic function is moderately reduced. The right ventricular size is mildly enlarged.  3. Right atrial size was mildly dilated.  4. The mitral valve is normal in structure. Trivial mitral valve regurgitation.  5. The aortic valve has been repaired/replaced. Aortic valve regurgitation is not visualized. There is a 26 mm Sapien prosthetic (TAVR) valve present in the aortic position. Procedure Date: 07/13/23.  6. Aortic root/ascending aorta has been repaired/replaced. Comparison(s):  Changes from prior study are noted. LVEF is significantly worse. TAVR valve in place with no paravalvular  cm/s AV Vmean:          132.000 cm/s AV VTI:            0.309 m AV Peak Grad:      16.6 mmHg AV Mean Grad:      8.0 mmHg LVOT Vmax:         83.90 cm/s LVOT Vmean:        50.700 cm/s LVOT VTI:          0.121 m LVOT/AV VTI ratio: 0.39  AORTA Ao Root diam: 2.30 cm Ao Asc diam:  2.10 cm MITRAL VALVE MV Area (PHT): 3.85 cm    SHUNTS MV Decel Time: 197 msec    Systemic VTI:  0.12 m MV E velocity: 52.80 cm/s  Systemic Diam: 2.40 cm MV A velocity: 54.80 cm/s MV E/A ratio:  0.96 Clearnce Hasten Electronically signed by Clearnce Hasten Signature Date/Time: 07/14/2023/3:59:05 PM    Final    DG Chest Port 1 View  Result Date: 07/13/2023 CLINICAL DATA:  Aortic valve stenosis. EXAM: PORTABLE CHEST 1 VIEW COMPARISON:  Chest radiograph dated 07/11/2023 FINDINGS: The heart size and mediastinal contours are within normal limits. A small left pleural effusion with associated atelectasis/airspace disease has decreased since 07/11/2023. The right lung is clear. No pneumothorax on either side. A right peripherally inserted central venous catheter tip overlies the superior cavoatrial junction.  IMPRESSION: Small left pleural effusion with associated atelectasis/airspace disease has decreased since 07/11/2023. Electronically Signed   By: Romona Curls M.D.   On: 07/13/2023 13:02   ECHOCARDIOGRAM LIMITED  Result Date: 07/13/2023    ECHOCARDIOGRAM LIMITED REPORT   Patient Name:   Christopher Ball [facility] Date of Exam: 07/13/2023 Medical Rec #:  [id number]        Height:       78.0 in Accession #:    [id number]       Weight:       135.8 lb Date of Birth:  Feb 21, 1971        BSA:          1.915 m Patient Age:    52 years         BP:           129/40 mmHg Patient Gender: M                HR:           83 bpm. Exam Location:  Inpatient Procedure: Limited Echo, Color Doppler, Cardiac Doppler and Echo Assisted            Procedure Indications:     TAVR implantation  History:         Patient has prior history of Echocardiogram examinations, most                  recent 07/11/2023. 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 Surgical AVR is a 27mm Freestyle                  homograft placed in 2007; TAVR valve is a 26mm Sapien 3 Ultra.  cm/s AV Vmean:          132.000 cm/s AV VTI:            0.309 m AV Peak Grad:      16.6 mmHg AV Mean Grad:      8.0 mmHg LVOT Vmax:         83.90 cm/s LVOT Vmean:        50.700 cm/s LVOT VTI:          0.121 m LVOT/AV VTI ratio: 0.39  AORTA Ao Root diam: 2.30 cm Ao Asc diam:  2.10 cm MITRAL VALVE MV Area (PHT): 3.85 cm    SHUNTS MV Decel Time: 197 msec    Systemic VTI:  0.12 m MV E velocity: 52.80 cm/s  Systemic Diam: 2.40 cm MV A velocity: 54.80 cm/s MV E/A ratio:  0.96 Clearnce Hasten Electronically signed by Clearnce Hasten Signature Date/Time: 07/14/2023/3:59:05 PM    Final    DG Chest Port 1 View  Result Date: 07/13/2023 CLINICAL DATA:  Aortic valve stenosis. EXAM: PORTABLE CHEST 1 VIEW COMPARISON:  Chest radiograph dated 07/11/2023 FINDINGS: The heart size and mediastinal contours are within normal limits. A small left pleural effusion with associated atelectasis/airspace disease has decreased since 07/11/2023. The right lung is clear. No pneumothorax on either side. A right peripherally inserted central venous catheter tip overlies the superior cavoatrial junction.  IMPRESSION: Small left pleural effusion with associated atelectasis/airspace disease has decreased since 07/11/2023. Electronically Signed   By: Romona Curls M.D.   On: 07/13/2023 13:02   ECHOCARDIOGRAM LIMITED  Result Date: 07/13/2023    ECHOCARDIOGRAM LIMITED REPORT   Patient Name:   Christopher Ball [facility] Date of Exam: 07/13/2023 Medical Rec #:  [id number]        Height:       78.0 in Accession #:    [id number]       Weight:       135.8 lb Date of Birth:  Feb 21, 1971        BSA:          1.915 m Patient Age:    52 years         BP:           129/40 mmHg Patient Gender: M                HR:           83 bpm. Exam Location:  Inpatient Procedure: Limited Echo, Color Doppler, Cardiac Doppler and Echo Assisted            Procedure Indications:     TAVR implantation  History:         Patient has prior history of Echocardiogram examinations, most                  recent 07/11/2023. 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 Surgical AVR is a 27mm Freestyle                  homograft placed in 2007; TAVR valve is a 26mm Sapien 3 Ultra.  cm/s AV Vmean:          132.000 cm/s AV VTI:            0.309 m AV Peak Grad:      16.6 mmHg AV Mean Grad:      8.0 mmHg LVOT Vmax:         83.90 cm/s LVOT Vmean:        50.700 cm/s LVOT VTI:          0.121 m LVOT/AV VTI ratio: 0.39  AORTA Ao Root diam: 2.30 cm Ao Asc diam:  2.10 cm MITRAL VALVE MV Area (PHT): 3.85 cm    SHUNTS MV Decel Time: 197 msec    Systemic VTI:  0.12 m MV E velocity: 52.80 cm/s  Systemic Diam: 2.40 cm MV A velocity: 54.80 cm/s MV E/A ratio:  0.96 Clearnce Hasten Electronically signed by Clearnce Hasten Signature Date/Time: 07/14/2023/3:59:05 PM    Final    DG Chest Port 1 View  Result Date: 07/13/2023 CLINICAL DATA:  Aortic valve stenosis. EXAM: PORTABLE CHEST 1 VIEW COMPARISON:  Chest radiograph dated 07/11/2023 FINDINGS: The heart size and mediastinal contours are within normal limits. A small left pleural effusion with associated atelectasis/airspace disease has decreased since 07/11/2023. The right lung is clear. No pneumothorax on either side. A right peripherally inserted central venous catheter tip overlies the superior cavoatrial junction.  IMPRESSION: Small left pleural effusion with associated atelectasis/airspace disease has decreased since 07/11/2023. Electronically Signed   By: Romona Curls M.D.   On: 07/13/2023 13:02   ECHOCARDIOGRAM LIMITED  Result Date: 07/13/2023    ECHOCARDIOGRAM LIMITED REPORT   Patient Name:   Christopher Ball [facility] Date of Exam: 07/13/2023 Medical Rec #:  [id number]        Height:       78.0 in Accession #:    [id number]       Weight:       135.8 lb Date of Birth:  Feb 21, 1971        BSA:          1.915 m Patient Age:    52 years         BP:           129/40 mmHg Patient Gender: M                HR:           83 bpm. Exam Location:  Inpatient Procedure: Limited Echo, Color Doppler, Cardiac Doppler and Echo Assisted            Procedure Indications:     TAVR implantation  History:         Patient has prior history of Echocardiogram examinations, most                  recent 07/11/2023. 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 Surgical AVR is a 27mm Freestyle                  homograft placed in 2007; TAVR valve is a 26mm Sapien 3 Ultra.  BILITOT 1.6* 1.2  PROT 7.5 6.8  ALBUMIN 3.7 3.3*   No results for input(s): "LIPASE", "AMYLASE" in the last 72 hours. Cardiac Enzymes No results for input(s): "CKTOTAL", "CKMB", "CKMBINDEX", "TROPONINI" in the last 72 hours. BNP Invalid input(s): "POCBNP" D-Dimer No results for input(s): "DDIMER" in the last 72 hours. Hemoglobin A1C Recent Labs    07/13/23 0249  HGBA1C 5.6   Fasting Lipid Panel No results for input(s): "CHOL", "HDL", "LDLCALC", "TRIG", "CHOLHDL", "LDLDIRECT" in the last 72 hours. Thyroid Function Tests No results for input(s): "TSH", "T4TOTAL", "T3FREE", "THYROIDAB" in the last 72 hours.  Invalid input(s): "FREET3"  Telemetry    sinus - Personally Reviewed  ECG    Sinus with LVH and repol abnormalities. PVCs/PACs. HR 95  - Personally Reviewed  Radiology    ECHOCARDIOGRAM COMPLETE  Result  Date: 07/14/2023    ECHOCARDIOGRAM REPORT   Patient Name:   Christopher Ball [facility] Date of Exam: 07/14/2023 Medical Rec #:  [id number]        Height:       78.0 in Accession #:    [id number]       Weight:       127.4 lb Date of Birth:  12-04-1970        BSA:          1.864 m Patient Age:    52 years         BP:           159/86 mmHg Patient Gender: M                HR:           93 bpm. Exam Location:  Inpatient Procedure: 2D Echo, Cardiac Doppler, Color Doppler, Strain Analysis and            Intracardiac Opacification Agent Indications:    Post TAVR evaluation  History:        Patient has no prior history of Echocardiogram examinations and                 Patient has prior history of Echocardiogram examinations, most                 recent 06/05/2023. Prior Cardiac Surgery; Arrythmias:PAC.                 Aortic Valve: 26 mm Sapien prosthetic, stented (TAVR) valve is                 present in the aortic position. Procedure Date: 07/13/23.  Sonographer:    Karma Ganja Referring Phys: [id number] Orbie Pyo  Sonographer Comments: Image acquisition challenging due to patient body habitus. Global longitudinal strain was attempted. IMPRESSIONS  1. Left ventricular ejection fraction, by estimation, is 25 to 30%. The left ventricle has severely decreased function. The left ventricle demonstrates global hypokinesis. Left ventricular diastolic parameters are consistent with Grade I diastolic dysfunction (impaired relaxation).  2. Right ventricular systolic function is moderately reduced. The right ventricular size is mildly enlarged.  3. Right atrial size was mildly dilated.  4. The mitral valve is normal in structure. Trivial mitral valve regurgitation.  5. The aortic valve has been repaired/replaced. Aortic valve regurgitation is not visualized. There is a 26 mm Sapien prosthetic (TAVR) valve present in the aortic position. Procedure Date: 07/13/23.  6. Aortic root/ascending aorta has been repaired/replaced. Comparison(s):  Changes from prior study are noted. LVEF is significantly worse. TAVR valve in place with no paravalvular

## 2023-07-15 NOTE — Discharge Summary (Incomplete)
HEART AND VASCULAR CENTER   MULTIDISCIPLINARY HEART VALVE TEAM  Discharge Summary    Patient ID: Christopher Ball MRN: [id number]; DOB: 06-Aug-1971  Admit date: 07/10/2023 Discharge date: 07/16/2023  Primary Care Provider: Lorre Munroe, NP  Primary Cardiologist: Christopher Batty, MD / Dr. Lynnette Ball and Dr. Laneta Ball (TAVR)  Discharge Diagnoses    Principal Problem:   Cardiogenic shock [facility]) Active Problems:   Marfan syndrome   Underweight   Emphysema lung (HCC)   Severe aortic regurgitation   H/O aortic valve replacement   Acute on chronic diastolic (congestive) heart failure (HCC)   AKI (acute kidney injury) (HCC)   Aortic aneurysm of unspecified site, without rupture (HCC)   S/P VIV TAVR (transcatheter aortic valve replacement)   Allergies Allergies  Allergen Reactions   Augmentin [Amoxicillin-Pot Clavulanate] Shortness Of Breath and Nausea And Vomiting    Diagnostic Studies/Procedures    TAVR OPERATIVE NOTE     Date of Procedure:                07/13/2023   Preoperative Diagnosis: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   Postoperative Diagnosis:    Same    Procedure:        Transcatheter Aortic Valve in Valve Replacement - Percutaneous Right Transfemoral Approach             Edwards Sapien 3 Ultra Resilia THV (size 26 mm, model # 9755RSL, serial # [id number])              Co-Surgeons:                        Christopher Borne, MD and Christopher Skeans, MD     Anesthesiologist:                  Christopher Sox, MD   Echocardiographer:              Christopher Scarlet, MD   Pre-operative Echo Findings: Severe aortic insufficiency Moderate left ventricular systolic dysfunction   Post-operative Echo Findings: No paravalvular leak Moderate left ventricular systolic dysfunction    _____________    Echo 07/14/23:  IMPRESSIONS   1. Left ventricular ejection fraction, by estimation, is 25 to 30%. The  left ventricle has severely decreased  function. The left ventricle  demonstrates global hypokinesis. Left ventricular diastolic parameters are  consistent with Grade I diastolic  dysfunction (impaired relaxation).   2. Right ventricular systolic function is moderately reduced. The right  ventricular size is mildly enlarged.   3. Right atrial size was mildly dilated.   4. The mitral valve is normal in structure. Trivial mitral valve  regurgitation.   5. The aortic valve has been repaired/replaced. Aortic valve  regurgitation is not visualized. There is a 26 mm Sapien prosthetic (TAVR)  valve present in the aortic position. Procedure Date: 07/13/23.   6. Aortic root/ascending aorta has been repaired/replaced.   Comparison(s): Changes from prior study are noted. LVEF is significantly  worse. TAVR valve in place with no paravalvular leak, functioning well.    History of Present Illness     Christopher Ball is a 52 y.o. male with a history of Marfan syndrome, tobacco abuse, CKD stage II, chronic HFpEF with progressive symptoms (class III sx), s/p aortic root replacement with a Medtronic freestyle 27 mm aortic valve and ascending aortic replacement with a 24 mm Hemashield graft with coronary artery reimplantation in 2007, and severe bioprosthetic AI with acute decompensated  heart failure who was admitted on 07/10/23 for acute CHF that evolved into cardiogenic shock.   Christopher Ball underwent aortic root replacement with a Medtronic freestyle 27 mm aortic valve and ascending aortic replacement with a 24 mm Hemashield graft with coronary artery reimplantation in 2007 with Dr. Tyrone Ball. He refused mechanical valve with obligate long term Coumadin at the time.    In August 2024 he developed abrupt chest pain/SOB and presented to the ED. He was found to have an elevated BNP >900 and mildly elevated hstrop. A CTA of the chest, abdomen, and pelvis was performed and showed no evidence of dissection.    He was seen in cardiology follow up and  complained of progressive symptoms. Cardiac CT 06/02/23 showed CAC of 0, mild HALT of prosthetic valve and mild root dilation. There was also evidence of partial anomalous pulmonary venous return with evidence of an atrial septal defect and with associated right ventricular dilation.    Follow up echo 06/05/23 showed EF 55%, mod pulm HTN, S/p bioprosthetic AVR with known restricted R/L cusps with eccentric moderate to severe AI. PHT 198 ms and holodiastolic flow reversal. There was moderate aortic valve stenosis with 27 mmHg, peak grad 44.1 mmHg, AVA 1.1 cm2, DVI 0.39.    TEE 06/25/23 showed EF 60%, moderate pericardial effusion, severe aortic regurgitation with an eccentric AI jet and a prolapsed leaflet with PHT . There was moderate AS with a mean gradient of 32 mmhg and moderate PFO with predominantly L->R shunting. Elevated aortic gradients felt to be 2/2 AI vs true aortic stenosis.    He was seen by Dr. Excell Ball in the office on 07/07/23. Physical exam c/w severe AS with wide pulse pressure and progressive heart failure symptoms. He was set up for [facility] 07/10/23 which showed normal right dominant coronary arteries, severely elevated LVEDP, severe AI and moderate pulmonary HTN with mean PA pressure 43 mmHg, RA pressure 16 mmHg, right heart pressure elevation likely secondary to left heart failure/severe AI (transpulmonary gradient 6 mmHg). He was admitted after cath for IV diuresis, formal TAVR CT scanning and cardiothoracic surgical consultation.   Hospital Course     Consultants: Advanced CHF   Severe bioprosthetic aortic valve failure with severe AI: s/p urgent VIV TAVR with a 26 mm Edwards S3UR via the TF approach using Sentinel Cerebral Embolic Protection on 07/13/23. Plan for Eliquis 2.5mg  BID x 3 months followed by aspirin 81mg  daily indefinitely. POD1 echo showed EF 25%, normally functioning TAVR with a mean gradient of 8 mmHg and no PVL. Plan for discharge home today with close follow up in  the outpatient setting.    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 Diuresed 21 pounds (148--> 127 lbs). Diuretics held. Encouraged PO intake. Hydralazine discontinued. Started on Losartan 25mg  daily, Toprol XL 25mg  at bedtime, Jardiance 10mg  daily. Will plan to add spiro in the outpatient setting.    Cardiogenic shock: worsened after given beta blockers to attempt to rescan the patient for TAVR scanning. Had AKI, elevated LFTs and lactate peaked at 4.5. He required the advanced CHF team who started dobutamine and norepinephrine to maintain MAP.  He stabilized after TAVR and taken off all gtts.    AKI: creat peaked at 3.58. Creat 1.56 today which is likely his baseline. Will recheck CMET in office next week.    Elevated LFTs: due to congestion. Improving: AST/ALT 116/258 today. Will hold atorvastatin 20mg  daily until LFTs normalize.  _____________  Discharge Vitals Blood pressure 129/75,  pulse 83, temperature 98 F (36.7 C), temperature source Oral, resp. rate 13, height 6\' 6"  (1.981 m), weight 60.9 kg, SpO2 98%.  Filed Weights   07/13/23 0430 07/14/23 0613 07/16/23 0618  Weight: 61.6 kg 57.8 kg 60.9 kg    GEN: tall thin white male HEENT: normal Neck: no JVD or masses Cardiac: RRR; no murmurs, rubs, or gallops,no edema  Respiratory:  clear to auscultation bilaterally, normal work of breathing GI: soft, nontender, nondistended, + BS MS: no deformity or atrophy Skin: warm and dry, no rash.  Groin sites clear without hematoma or ecchymosis  Neuro:  Alert and Oriented x 3, Strength and sensation are intact Psych: euthymic mood, full affect  Disposition   Pt is being discharged home today in good condition.  Follow-up Plans & Appointments     Follow-up Information     Christopher Munroe, NP. Go in 2 day(s).   Specialties: Internal Medicine, Emergency Medicine Why: Hospital follow up appointment schedule for Friday, July 18, 2023 at 2:00 PM.  PLEASE ARRIVE 10-15  minutes early.  PLEASE call and cancel/reschedule if you CANNOT make appointment. Contact information: [address] [place] [address] [phone]         Romie Minus, MD Follow up on 07/25/2023.   Specialty: Cardiology Why: at 1120 . Jacelyn Pi at [facility] Contact information: [address] [address] [phone]         Janetta Hora, PA-C. Go on 07/23/2023.   Specialties: Cardiology, Radiology Why: @ 1:30pm, please arrive at least 10 minutes early. Contact information: [address] [place] [address] [phone]                Discharge Instructions     Amb Referral to Cardiac Rehabilitation   Complete by: As directed    Diagnosis: Valve Replacement   Valve: Aortic   After initial evaluation and assessments completed: Virtual Based Care may be provided alone or in conjunction with Phase 2 Cardiac Rehab based on patient barriers.: Yes   Intensive Cardiac Rehabilitation (ICR) [facility] location only OR Traditional Cardiac Rehabilitation (TCR) *If criteria for ICR are not met will enroll in TCR [facility] only): Yes       Discharge Medications   Allergies as of 07/16/2023       Reactions   Augmentin [amoxicillin-pot Clavulanate] Shortness Of Breath, Nausea And Vomiting        Medication List     STOP taking these medications    atorvastatin 20 MG tablet Commonly known as: LIPITOR   furosemide 40 MG tablet Commonly known as: LASIX       TAKE these medications    apixaban 2.5 MG Tabs tablet Commonly known as: ELIQUIS Take 1 tablet (2.5 mg total) by mouth 2 (two) times daily.   clindamycin 300 MG capsule Commonly known as: CLEOCIN Take 2 tablets 1 hour prior to dental appointment   empagliflozin 10 MG Tabs tablet Commonly known as: JARDIANCE Take 1 tablet (10 mg total) by mouth daily.   fluticasone 50 MCG/ACT nasal spray Commonly known as: FLONASE Place 1 spray into both  nostrils daily as needed for rhinitis.   levocetirizine 5 MG tablet Commonly known as: XYZAL Take 5 mg by mouth every evening.   losartan 25 MG tablet Commonly known as: COZAAR Take 1 tablet (25 mg total) by mouth daily.   metoprolol succinate 25 MG 24 hr tablet Commonly known as: TOPROL-XL  Take 1 tablet (25 mg total) by mouth at bedtime.   multivitamin with minerals tablet Take 1 tablet by mouth daily.   tadalafil 20 MG tablet Commonly known as: CIALIS Take 1 tablet (20 mg total) by mouth daily.          Outstanding Labs/Studies   CMET  Duration of Discharge Encounter   Greater than 30 minutes including physician time.  Byrd Hesselbach, PA-C 07/16/2023, 8:51 AM [phone]  ATTENDING ATTESTATION:  After conducting a review of all available clinical information with the care team, interviewing the patient, and performing a physical exam, I agree with the findings and plan described in this note.   GEN: No acute distress.   HEENT:  MMM, no JVD, no scleral icterus Cardiac: RRR, with 2 out of 6 systolic murmur Respiratory: Clear to auscultation bilaterally. GI: Soft, nontender, non-distended  MS: No edema; No deformity. Neuro:  Nonfocal  Vasc:  +2 radial pulses; access sites intact  Patient is doing well after urgent transcatheter valve replacement for severe bioprosthetic aortic valve insufficiency with cardiogenic shock.  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  He is on goal-directed medical therapy.  He is euvolemic on exam.  His AKI and LFTs are improving.  Will continue losartan, Jardiance, and Toprol.  Consider addition of spironolactone as an outpatient.  Echocardiogram in 1 month.  Continue low-dose Eliquis for 3 months then start aspirin monotherapy indefinitely for valve in valve configuration.  Discharge today.    Christopher Skeans, MD Pager [phone]

## 2023-07-15 NOTE — Progress Notes (Signed)
Mobility Specialist Progress Note:   07/15/23 1117  Mobility  Activity Ambulated with assistance in hallway  Level of Assistance  (MinG)  Assistive Device Front wheel walker  Distance Ambulated (ft) 470 ft  Activity Response Tolerated well  Mobility Referral Yes  $Mobility charge 1 Mobility  Mobility Specialist Start Time (ACUTE ONLY) 1040  Mobility Specialist Stop Time (ACUTE ONLY) 1105  Mobility Specialist Time Calculation (min) (ACUTE ONLY) 25 min   Pre Mobility: 86 HR , 91% SpO2 During Mobility: 101 HR , 97% SpO2 Post Mobility: 99 HR   Pt received in bed, agreeable to mobility. Very motivated to ambulate greater distance compared to yesterday. Pt needing 2x standing rest breaks d/t lower back pain. Pt denied any other discomfort, asymptomatic throughout. Pt left in chair with call bell in reach and all needs met.   Leory Plowman  Mobility Specialist Please contact via [employer] office at [phone]

## 2023-07-15 NOTE — Progress Notes (Signed)
PHARMACY - ANTICOAGULATION CONSULT NOTE  Pharmacy Consult for Eliquis Indication:  Valve in Valve TAVR  Allergies  Allergen Reactions   Augmentin [Amoxicillin-Pot Clavulanate] Shortness Of Breath and Nausea And Vomiting    Patient Measurements: Height: 6\' 6"  (198.1 cm) Weight: 57.8 kg (127 lb 6.8 oz) IBW/kg (Calculated) : 91.4  Vital Signs: Temp: 97.7 F (36.5 C) (10/29 0819) Temp Source: Oral (10/29 0819) BP: 112/81 (10/29 0819) Pulse Rate: 80 (10/29 0449)  Labs: Recent Labs    07/13/23 0249 07/13/23 0518 07/13/23 1243 07/14/23 0421 07/15/23 0417  HGB 12.1* 13.6  --  15.0  --   HCT 35.4* 40.0  --  45.3  --   PLT 151  --   --  146*  --   APTT 28  --   --   --   --   LABPROT 16.5*  --   --   --   --   INR 1.3*  --   --   --   --   CREATININE 2.84*  --  2.31* 1.91* 1.41*    Estimated Creatinine Clearance: 50.7 mL/min (A) (by C-G formula based on SCr of 1.41 mg/dL (H)).   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 Lynnette Caffey & Dr. Laneta Simmers   Stomach problems    Thoracic aortic aneurysm [facility])    replacement by Dr. [name] /30/07 with aortic bioprosthetic valve   Tobacco abuse     Medications:  Scheduled:   apixaban  5 mg Oral BID   Chlorhexidine Gluconate Cloth  6 each Topical Daily   empagliflozin  10 mg Oral Daily   loratadine  10 mg Oral Daily   losartan  25 mg Oral Daily   metoprolol succinate  25 mg Oral QHS   mupirocin ointment  1 Application Nasal BID   pantoprazole  40 mg Oral Daily   polyethylene glycol  17 g Oral Daily   senna-docusate  1 tablet Oral QHS   sodium chloride flush  3 mL Intravenous Q12H    Assessment: 52 YOM admitted for evaluation of severe bioprosthetic aortic insufficiency. Patient had urgent transcatheter aortic valve replacement on 10/27 for the indication of  severe bioprosthetic aortic valve degeneration with sever aortic insufficiency and cardiogenic shock. Post-op patient started on plavix, received first dose today, 10/28. Pharmacy consulted to start the patient on Eliquis per MD request.   10/28: Patient weighs 57.8 kg today, down from 67.1 kg on admission. Scr improved to 1.91 mg/dL today, BL Scr is ~3.2 mg/dL. Based on weight (<60 kg) and renal function (Scr >1.5 mg/dL), patient currently meets criteria for dose reduction of Eliquis based on dosing in the ATLANTIS trial.   10/29: SCr improved to 1.41. CBC not checked today. No s/sx of bleeding  Goal of Therapy:  Monitor platelets by anticoagulation protocol: Yes   Plan:  Increase apixaban dose to 5 mg PO BID and continue for 3 months Monitor renal function and weight to ensure dosing remains appropriate Resume aspirin in 3 months after completing 8-month course of Eliquis Monitor CBC and s/sx of bleeding   Wilburn Cornelia, PharmD, BCPS Clinical Pharmacist 07/15/2023 12:28 PM   Please refer to [facility] for pharmacy phone number

## 2023-07-15 NOTE — TOC Progression Note (Signed)
Transition of Care [facility]) - Progression Note    Patient Details  Name: Christopher Ball MRN: [id number] Date of Birth: 07/27/1971  Transition of Care [facility]) CM/SW Contact  Nicanor Bake Phone Number: [phone] 07/15/2023, 2:21 PM  Clinical Narrative:   HF CSW met with pt at bedside. Pt was ordering his lunch. Pt appeared to be more alert and had more energy. Pt stated that he was forward to going home soon. Pt stated that he is ok at this time.    TOC will continue following.     Expected Discharge Plan: Home/Self Care Barriers to Discharge: Continued Medical Work up  Expected Discharge Plan and Services       Living arrangements for the past 2 months: Single Family Home                                       Social Determinants of Health (SDOH) Interventions SDOH Screenings   Food Insecurity: No Food Insecurity (07/11/2023)  Housing: Low Risk  (07/11/2023)  Transportation Needs: No Transportation Needs (07/11/2023)  Utilities: Not At Risk (07/11/2023)  Alcohol Screen: Low Risk  (07/18/2022)  Depression (PHQ2-9): Low Risk  (01/16/2023)  Tobacco Use: High Risk (07/11/2023)    Readmission Risk Interventions     No data to display

## 2023-07-15 NOTE — Progress Notes (Signed)
CARDIAC REHAB PHASE I   Pt sitting up in bed, feeling well today. Pt eating breakfast, asked if he could walk little later. Reports ambulating several times yesterday, tolerating well with mild fatigue. Post TAVR education including site care, restrictions, risk factors, heart healthy diet, smoking cessation, exercise guidelines and CRP2 reviewed. All questions and concerns addressed. Will refer to [facility] for CRP2. Will return later today to offer walk as time allows.   [id number]  Woodroe Chen, RN BSN 07/15/2023 8:52 AM

## 2023-07-15 NOTE — Progress Notes (Signed)
Heart Failure Navigator Progress Note  Assessed for Heart & Vascular TOC clinic readiness.  Patient does not meet criteria due to established Heart Failure Team patient of Dr. Gasper Lloyd.  Navigator will sign off at this time.  Roxy Horseman, RN, BSN [facility] Heart Failure Navigator Secure Chat Only

## 2023-07-16 ENCOUNTER — Other Ambulatory Visit (HOSPITAL_COMMUNITY): Payer: Self-pay

## 2023-07-16 ENCOUNTER — Telehealth (HOSPITAL_COMMUNITY): Payer: Self-pay | Admitting: Pharmacy Technician

## 2023-07-16 ENCOUNTER — Other Ambulatory Visit: Payer: Self-pay

## 2023-07-16 DIAGNOSIS — Z006 Encounter for examination for normal comparison and control in clinical research program: Secondary | ICD-10-CM | POA: Diagnosis not present

## 2023-07-16 DIAGNOSIS — T82897A Other specified complication of cardiac prosthetic devices, implants and grafts, initial encounter: Secondary | ICD-10-CM | POA: Diagnosis not present

## 2023-07-16 DIAGNOSIS — N170 Acute kidney failure with tubular necrosis: Secondary | ICD-10-CM | POA: Diagnosis not present

## 2023-07-16 DIAGNOSIS — R570 Cardiogenic shock: Secondary | ICD-10-CM | POA: Diagnosis not present

## 2023-07-16 DIAGNOSIS — I5043 Acute on chronic combined systolic (congestive) and diastolic (congestive) heart failure: Secondary | ICD-10-CM | POA: Diagnosis not present

## 2023-07-16 DIAGNOSIS — M6281 Muscle weakness (generalized): Secondary | ICD-10-CM | POA: Diagnosis not present

## 2023-07-16 LAB — COMPREHENSIVE METABOLIC PANEL
ALT: 258 U/L — ABNORMAL HIGH (ref 0–44)
AST: 116 U/L — ABNORMAL HIGH (ref 15–41)
Albumin: 3 g/dL — ABNORMAL LOW (ref 3.5–5.0)
Alkaline Phosphatase: 147 U/L — ABNORMAL HIGH (ref 38–126)
Anion gap: 7 (ref 5–15)
BUN: 56 mg/dL — ABNORMAL HIGH (ref 6–20)
CO2: 28 mmol/L (ref 22–32)
Calcium: 8.8 mg/dL — ABNORMAL LOW (ref 8.9–10.3)
Chloride: 102 mmol/L (ref 98–111)
Creatinine, Ser: 1.56 mg/dL — ABNORMAL HIGH (ref 0.61–1.24)
GFR, Estimated: 53 mL/min — ABNORMAL LOW (ref >60)
Glucose, Bld: 119 mg/dL — ABNORMAL HIGH (ref 70–99)
Potassium: 4.5 mmol/L (ref 3.5–5.1)
Sodium: 137 mmol/L (ref 135–145)
Total Bilirubin: 1 mg/dL (ref 0.3–1.2)
Total Protein: 6.4 g/dL — ABNORMAL LOW (ref 6.5–8.1)

## 2023-07-16 MED ORDER — EMPAGLIFLOZIN 10 MG PO TABS
10.0000 mg | ORAL_TABLET | Freq: Every day | ORAL | 6 refills | Status: DC
  Filled 2023-07-16 – 2023-08-11 (×2): qty 30, 30d supply, fill #0
  Filled 2023-09-09: qty 30, 30d supply, fill #1
  Filled 2023-10-16: qty 30, 30d supply, fill #2
  Filled 2023-11-09: qty 30, 30d supply, fill #3
  Filled 2023-12-14: qty 30, 30d supply, fill #4

## 2023-07-16 MED ORDER — METOPROLOL SUCCINATE ER 25 MG PO TB24
25.0000 mg | ORAL_TABLET | Freq: Every day | ORAL | 6 refills | Status: DC
  Filled 2023-07-16 – 2023-08-11 (×2): qty 30, 30d supply, fill #0
  Filled 2023-09-01: qty 30, 30d supply, fill #1
  Filled 2023-10-05 – 2023-10-16 (×2): qty 30, 30d supply, fill #2
  Filled 2023-11-09: qty 30, 30d supply, fill #3
  Filled 2023-12-14: qty 30, 30d supply, fill #4
  Filled 2024-01-18: qty 30, 30d supply, fill #5

## 2023-07-16 MED ORDER — APIXABAN 2.5 MG PO TABS
2.5000 mg | ORAL_TABLET | Freq: Two times a day (BID) | ORAL | 0 refills | Status: DC
  Filled 2023-07-16: qty 60, 30d supply, fill #0
  Filled 2023-08-11: qty 30, 15d supply, fill #0

## 2023-07-16 MED ORDER — LOSARTAN POTASSIUM 25 MG PO TABS
25.0000 mg | ORAL_TABLET | Freq: Every day | ORAL | 6 refills | Status: DC
  Filled 2023-07-16: qty 30, 30d supply, fill #0

## 2023-07-16 NOTE — Telephone Encounter (Signed)
Pharmacy Patient Advocate Encounter  Received notification from [facility] that Prior Authorization for Jardiance 10MG  tablets has been APPROVED from 07/16/2023 to 07/15/2024   PA #/Case ID/Reference #: [id number]  Key: [id number]

## 2023-07-16 NOTE — Progress Notes (Addendum)
CARDIAC REHAB PHASE I   Pt sitting in chair, feeling well today. Reports ambulating independently with no complaints. States he is going home today. Post TAVR education completed 10-29. All questions and concerns addressed. Referral sent to [facility] for CRP2.  [id number] Woodroe Chen, RN BSN 07/16/2023 9:27 AM

## 2023-07-16 NOTE — TOC Transition Note (Signed)
Transition of Care (TOC) - CM/SW Discharge Note Donn Pierini RN, BSN Transitions of Care Unit 4E- RN Case Manager See Treatment Team for direct phone #   Patient Details  Name: OBE CORZO MRN: [id number] Date of Birth: [birth year]  Transition of Care [facility]) CM/SW Contact:  Darrold Span, RN Phone Number: 07/16/2023, 11:07 AM   Clinical Narrative:    Pt s/p TAVR 10/29, stable for transition home today.  CM received msg from Cardiac Rehab that pt needed RW for home. Order has been placed for DME- RW.   Call made to in-house DME provider- [employer] for DME referral- RW to be delivered to room once processed with insurance.   Family to transport home, No further TOC needs noted.    Final next level of care: Home/Self Care Barriers to Discharge: Barriers Resolved   Patient Goals and CMS Choice CMS Medicare.gov Compare Post Acute Care list provided to:: Patient Choice offered to / list presented to : Patient  Discharge Placement                 Home        Discharge Plan and Services Additional resources added to the After Visit Summary for     Discharge Planning Services: CM Consult Post Acute Care Choice: Durable Medical Equipment          DME Arranged: Dan Humphreys rolling DME Agency: [employer] Date DME Agency Contacted: 07/16/23 Time DME Agency Contacted: 1025 Representative spoke with at DME Agency: Zack HH Arranged: NA HH Agency: NA        Social Determinants of Health (SDOH) Interventions SDOH Screenings   Food Insecurity: No Food Insecurity (07/11/2023)  Housing: Low Risk  (07/11/2023)  Transportation Needs: No Transportation Needs (07/11/2023)  Utilities: Not At Risk (07/11/2023)  Alcohol Screen: Low Risk  (07/18/2022)  Depression (PHQ2-9): Low Risk  (01/16/2023)  Tobacco Use: High Risk (07/11/2023)     Readmission Risk Interventions    07/16/2023   11:07 AM  Readmission Risk Prevention Plan  Post Dischage Appt Complete  Medication  Screening Complete  Transportation Screening Complete

## 2023-07-17 ENCOUNTER — Telehealth: Payer: Self-pay | Admitting: Physician Assistant

## 2023-07-17 ENCOUNTER — Telehealth: Payer: Self-pay

## 2023-07-17 NOTE — Telephone Encounter (Signed)
  HEART AND VASCULAR CENTER   MULTIDISCIPLINARY HEART VALVE TEAM   Patient contacted regarding discharge from [facility] on 10/30  Patient understands to follow up with a structural heart APP on 11/6 at [facility].  Patient understands discharge instructions? yes Patient understands medications and regimen? yes Patient understands to bring all medications to this visit? yes  Cline Crock PA-C  [facility]

## 2023-07-17 NOTE — Transitions of Care (Post Inpatient/ED Visit) (Signed)
07/17/2023  Name: Christopher Ball MRN: [id number] DOB: June 12, 1971  Today's TOC FU Call Status: Today's TOC FU Call Status:: Unsuccessful Call (1st Attempt) Unsuccessful Call (1st Attempt) Date: 07/17/23  Attempted to reach the patient regarding the most recent Inpatient/ED visit.  Follow Up Plan: Additional outreach attempts will be made to reach the patient to complete the Transitions of Care (Post Inpatient/ED visit) call.   Jodelle Gross RN, BSN, CCM RN Care Manager  Transitions of Care  [facility]  [phone]

## 2023-07-18 ENCOUNTER — Telehealth (HOSPITAL_COMMUNITY): Payer: Self-pay

## 2023-07-18 ENCOUNTER — Ambulatory Visit: Payer: Federal, State, Local not specified - PPO | Admitting: Internal Medicine

## 2023-07-18 ENCOUNTER — Telehealth: Payer: Self-pay

## 2023-07-18 ENCOUNTER — Encounter: Payer: Self-pay | Admitting: Internal Medicine

## 2023-07-18 VITALS — BP 104/60 | Ht 78.0 in | Wt 150.0 lb

## 2023-07-18 DIAGNOSIS — I509 Heart failure, unspecified: Secondary | ICD-10-CM | POA: Diagnosis not present

## 2023-07-18 DIAGNOSIS — N179 Acute kidney failure, unspecified: Secondary | ICD-10-CM | POA: Diagnosis not present

## 2023-07-18 DIAGNOSIS — R570 Cardiogenic shock: Secondary | ICD-10-CM

## 2023-07-18 DIAGNOSIS — Z952 Presence of prosthetic heart valve: Secondary | ICD-10-CM | POA: Diagnosis not present

## 2023-07-18 NOTE — Progress Notes (Signed)
Subjective:    Patient ID: Christopher Ball, male    DOB: 10/30/1970, 52 y.o.   MRN: [id number]  HPI  Patient presents to the clinic today for hospital follow-up.  He presented to the hospital 10/24 with complaint of chest pain and shortness of breath which had started approximately 2 months prior.  He had been seen in the ER 04/2023 with CHF exacerbation.  Subsequently he was seen for cardiology follow-up.  Cardiac CT showed calcium score of 0, mild halt of prosthetic valve and mild root dilatation.  He had an echo 05/2023 which showed an EF of 55%, moderate pulmonary hypertension with severe aortic insufficiency.  TEE 06/2023 showed EF of 60% with moderate pericardial effusion, severe aortic regurgitation.  He followed up with cardiology 07/07/2023 and was scheduled for a left and right heart cath on 07/10/2023.  He ended up being admitted for IV diuresis, thoracic consultation and TAVR after developing cardiogenic shock.  He was started on Eliquis which he should continue for 3 months with a plan to follow with aspirin 81 mg daily.  He was started on losartan, metoprolol and jardiance.  There was a plan to add spironolactone in the outpatient setting but this has not been done yet. He was discharged 10/30.  Since discharge, he reports he has had significant improvement in his symptoms. His appetite has improved, his shortness of breath has resolved. He reports his right incision is leaking a little bloody/clear fluid. The left incision looks good. He has a followup scheduled 11/6. He has not started rehab yet.   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 Lynnette Caffey & Dr. Laneta Simmers   Stomach problems    Thoracic aortic aneurysm [facility])    replacement by Dr. [name] /30/07 with aortic bioprosthetic valve   Tobacco abuse      Current Outpatient Medications  Medication Sig Dispense Refill   apixaban (ELIQUIS) 2.5 MG TABS tablet Take 1 tablet (2.5 mg total) by mouth 2 (two) times daily. 90 tablet 0   clindamycin (CLEOCIN) 300 MG capsule Take 2 tablets 1 hour prior to dental appointment 2 capsule 3   empagliflozin (JARDIANCE) 10 MG TABS tablet Take 1 tablet (10 mg total) by mouth daily. 30 tablet 6   fluticasone (FLONASE) 50 MCG/ACT nasal spray Place 1 spray into both nostrils daily as needed for rhinitis.     levocetirizine (XYZAL) 5 MG tablet Take 5 mg by mouth every evening.     losartan (COZAAR) 25 MG tablet Take 1 tablet (25 mg total) by mouth daily. 30 tablet 6   metoprolol succinate (TOPROL-XL) 25 MG 24 hr tablet Take 1 tablet (25 mg total) by mouth at bedtime. 30 tablet 6   Multiple Vitamins-Minerals (MULTIVITAMIN WITH MINERALS) tablet Take 1 tablet by mouth daily.     tadalafil (CIALIS) 20 MG tablet Take 1 tablet (20 mg total) by mouth daily. 10 tablet 1   No current facility-administered medications for this visit.    Allergies  Allergen Reactions   Augmentin [Amoxicillin-Pot Clavulanate] Shortness Of Breath and Nausea And Vomiting    Family History  Problem Relation Age of Onset   Asthma Mother    Early death Father    Heart disease Father    Asthma Brother  Diabetes Maternal Grandmother    Cancer Neg Hx    Stroke Neg Hx    Kidney disease Neg Hx    Hypertension Neg Hx    Hyperlipidemia Neg Hx     Social History   Socioeconomic History   Marital status: Married    Spouse name: Not on file   Number of children: Not on file   Years of education: Not on file   Highest education level: Not on file  Occupational History   Not on file  Tobacco Use   Smoking status: Every Day    Current packs/day: 1.00    Average packs/day: 1 pack/day for 31.0 years (31.0 ttl pk-yrs)    Types: Cigars, Cigarettes   Smokeless tobacco: Never  Vaping Use   Vaping status: Every Day  Substance and  Sexual Activity   Alcohol use: Yes    Alcohol/week: 1.0 standard drink of alcohol    Types: 1 Shots of liquor per week    Comment: rare   Drug use: Yes    Types: Marijuana   Sexual activity: Yes  Other Topics Concern   Not on file  Social History Narrative   Not on file   Social Determinants of Health   Financial Resource Strain: Not on file  Food Insecurity: No Food Insecurity (07/11/2023)   Hunger Vital Sign    Worried About Running Out of Food in the Last Year: Never true    Ran Out of Food in the Last Year: Never true  Transportation Needs: No Transportation Needs (07/11/2023)   PRAPARE - Administrator, Civil Service (Medical): No    Lack of Transportation (Non-Medical): No  Physical Activity: Not on file  Stress: Not on file  Social Connections: Not on file  Intimate Partner Violence: Not At Risk (07/11/2023)   Humiliation, Afraid, Rape, and Kick questionnaire    Fear of Current or Ex-Partner: No    Emotionally Abused: No    Physically Abused: No    Sexually Abused: No     Constitutional: Pt reports fatigue. Denies fever, malaise, headache or abrupt weight changes.  HEENT: Denies eye pain, eye redness, ear pain, ringing in the ears, wax buildup, runny nose, nasal congestion, bloody nose, or sore throat. Respiratory: Denies difficulty breathing, shortness of breath, cough or sputum production.   Cardiovascular: Denies chest pain, chest tightness, palpitations or swelling in the hands or feet.  Gastrointestinal: Denies abdominal pain, bloating, constipation, diarrhea or blood in the stool.  GU: Denies urgency, frequency, pain with urination, burning sensation, blood in urine, odor or discharge. Musculoskeletal: Pt reports generalized weakness. Denies decrease in range of motion, difficulty with gait, muscle pain or joint pain and swelling.  Skin: Denies redness, rashes, lesions or ulcercations.  Neurological: Denies dizziness, difficulty with memory,  difficulty with speech or problems with balance and coordination.  Psych: Denies anxiety, depression, SI/HI.  No other specific complaints in a complete review of systems (except as listed in HPI above).  Objective:   Physical Exam  BP 104/60   Ht 6\' 6"  (1.981 m)   Wt 150 lb (68 kg)   BMI 17.33 kg/m   Wt Readings from Last 3 Encounters:  07/16/23 134 lb 4.2 oz (60.9 kg)  07/07/23 147 lb 6.4 oz (66.9 kg)  07/01/23 148 lb (67.1 kg)    General: Appears his stated age, underweight, in NAD. Skin: Incision noted to bilateral groin. Cardiovascular: Bradycardic with normal rhythm. Murmur noted. No BLE edema. Pulmonary/Chest: Normal effort  and positive vesicular breath sounds. No respiratory distress. No wheezes, rales or ronchi noted.  Musculoskeletal: Limping gait. Neurological: Alert and oriented.   BMET    Component Value Date/Time   NA 137 07/16/2023 0620   NA 146 (H) 06/23/2023 1123   K 4.5 07/16/2023 0620   CL 102 07/16/2023 0620   CO2 28 07/16/2023 0620   GLUCOSE 119 (H) 07/16/2023 0620   BUN 56 (H) 07/16/2023 0620   BUN 29 (H) 06/23/2023 1123   CREATININE 1.56 (H) 07/16/2023 0620   CREATININE 1.16 01/16/2023 0926   CALCIUM 8.8 (L) 07/16/2023 0620   GFRNONAA 53 (L) 07/16/2023 0620   GFRAA 78 06/23/2017 0917    Lipid Panel     Component Value Date/Time   CHOL 150 01/16/2023 0926   CHOL 166 08/24/2019 0942   TRIG 68 01/16/2023 0926   HDL 49 01/16/2023 0926   HDL 50 08/24/2019 0942   CHOLHDL 3.1 01/16/2023 0926   LDLCALC 86 01/16/2023 0926    CBC    Component Value Date/Time   WBC 12.4 (H) 07/14/2023 0421   RBC 4.70 07/14/2023 0421   HGB 15.0 07/14/2023 0421   HGB 13.2 06/23/2023 1123   HCT 45.3 07/14/2023 0421   HCT 41.0 06/23/2023 1123   PLT 146 (L) 07/14/2023 0421   PLT 187 06/23/2023 1123   MCV 96.4 07/14/2023 0421   MCV 101 (H) 06/23/2023 1123   MCH 31.9 07/14/2023 0421   MCHC 33.1 07/14/2023 0421   RDW 13.3 07/14/2023 0421   RDW 11.9  06/23/2023 1123   LYMPHSABS 2.3 01/01/2014 2000   MONOABS 0.6 01/01/2014 2000   EOSABS 0.4 01/01/2014 2000   BASOSABS 0.1 01/01/2014 2000    Hgb A1C Lab Results  Component Value Date   HGBA1C 5.6 07/13/2023            Assessment & Plan:   Hospital follow-up for cardiogenic shock, CHF exacerbation, status post TAVR, AKI:  Hospital notes, labs and imaging reviewed He reports his symptoms have significantly improved Continue current medications Followup with cardiac specialist Encouraged him to attend cardiac rehab  RTC in 1 week for your annual exam Nicki Reaper, NP

## 2023-07-18 NOTE — Telephone Encounter (Signed)
Attempted to call pt in regards to Cardiac rehab. LM on VM

## 2023-07-18 NOTE — Patient Instructions (Signed)
Heart Failure, Diagnosis  Heart failure means that your heart is not able to pump blood in the right way. This makes it hard for your body to work well. Heart failure is usually a long-term (chronic) condition. You must take good care of yourself and follow your treatment plan from your doctor. Different stages of heart failure have different treatment plans. The stages are: Stage A: At risk for heart failure. Stage B: Pre-heart failure. Stage C: Symptomatic heart failure. Stage D: Advanced heart failure. What are the causes? High blood pressure. Buildup of cholesterol and fat in the arteries. Heart attack. This injures the heart muscle. Heart valves that do not open and close properly. Damage of the heart muscle. This is also called cardiomyopathy. Infection of the heart muscle. This is also called myocarditis. Lung disease. What increases the risk? Getting older. The risk of heart failure goes up as a person ages. Being overweight. Using tobacco or nicotine products. Abusing alcohol or drugs. Having taken medicines that can damage the heart. Having any of these conditions: Diabetes. Abnormal heart rhythms. Thyroid problems. Low blood counts (anemia). Having a family history of heart failure. What are the signs or symptoms? Shortness of breath. Coughing. Swelling of the feet, ankles, legs, or belly. Losing or gaining weight for no reason. Trouble breathing. Waking from sleep because of the need to sit up and get more air. Fast heartbeat. Other symptoms may include: Being very tired. Feeling dizzy, or feeling like you may pass out (faint). Having no desire to eat. Feeling like you may vomit (nauseous). Peeing (urinating) more at night. Feeling confused. How is this treated? This condition may be treated with: Medicines. These can be given to treat blood pressure and to make the heart muscles stronger. Changes in your daily life. These may include: Eating a healthy  diet. Staying at a healthy body weight. Quitting tobacco, alcohol, and drug use. Doing exercises. Participating in a cardiac rehabilitation program. This program helps you improve your health through exercise, education, and counseling. Surgery. Surgery can be done to open blocked valves or to put devices in the heart, such as pacemakers. A donor heart (heart transplant). You will receive a healthy heart from a donor. Follow these instructions at home: Treat other conditions as told by your doctor. These may include high blood pressure, diabetes, thyroid disease, or abnormal heart rhythms. Learn as much as you can about heart failure. Get support as you need it. Keep all follow-up visits. Where to find more information American Heart Association: www.heart.org Centers for Disease Control and Prevention: www.cdc.gov National Institute on Aging: www.nia.nih.gov Summary Heart failure means that your heart is not able to pump blood in the right way. This condition is often caused by high blood pressure, heart attack, or damage of the heart muscle. Symptoms of this condition include shortness of breath and swelling of the feet, ankles, legs, or belly. You may also feel very tired or feel like you may vomit. You may be treated with medicines, surgery, or changes in your daily life. Treat other health conditions as told by your doctor. This information is not intended to replace advice given to you by your health care provider. Make sure you discuss any questions you have with your health care provider. Document Revised: 03/01/2021 Document Reviewed: 03/25/2020 Elsevier Patient Education  2024 Elsevier Inc.  

## 2023-07-18 NOTE — Transitions of Care (Post Inpatient/ED Visit) (Signed)
   07/18/2023  Name: Christopher Ball MRN: [id number] DOB: [birth year]  Today's TOC FU Call Status: Today's TOC FU Call Status:: Unsuccessful Call (2nd Attempt) Unsuccessful Call (2nd Attempt) Date: 07/18/23  Attempted to reach the patient regarding the most recent Inpatient/ED visit. Call went straight to voicemail.  Follow Up Plan: Additional outreach attempts will be made to reach the patient to complete the Transitions of Care (Post Inpatient/ED visit) call.   Jodelle Gross RN, BSN, CCM RN Care Manager  Transitions of Care  [facility]  [phone]

## 2023-07-18 NOTE — Telephone Encounter (Signed)
Referral recv'd and verified for MD signature. Follow up appointment is on 11/6@130 . Insurance benefits and eligibility TBD.

## 2023-07-22 ENCOUNTER — Encounter: Payer: Self-pay | Admitting: Internal Medicine

## 2023-07-22 ENCOUNTER — Ambulatory Visit (INDEPENDENT_AMBULATORY_CARE_PROVIDER_SITE_OTHER): Payer: Federal, State, Local not specified - PPO | Admitting: Internal Medicine

## 2023-07-22 VITALS — BP 112/70 | HR 67 | Ht 78.0 in | Wt 150.0 lb

## 2023-07-22 DIAGNOSIS — Z23 Encounter for immunization: Secondary | ICD-10-CM | POA: Diagnosis not present

## 2023-07-22 DIAGNOSIS — Z0001 Encounter for general adult medical examination with abnormal findings: Secondary | ICD-10-CM

## 2023-07-22 NOTE — Progress Notes (Unsigned)
[facility]   MULTIDISCIPLINARY HEART VALVE CLINIC                                     Cardiology Office Note:    Date:  07/23/2023   ID:  Christopher Ball, DOB 1971-01-07, MRN [id number]  PCP:  Christopher Munroe, NP  [facility] Cardiologist:  Nanetta Batty, MD  / Dr. Lynnette Caffey and Dr. Laneta Simmers (TAVR)  [facility] Electrophysiologist:  None   Referring MD: Christopher Munroe, NP   [facility] s/p TAVR  History of Present Illness:    Christopher Ball is a 52 y.o. 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  [name] underwent aortic root replacement with a Medtronic freestyle 27 mm aortic valve and ascending aortic replacement with a 24 mm Hemashield graft with coronary artery reimplantation in 2007 with Dr. [name]. He refused mechanical valve with obligate long term Coumadin at the time.    In August 2024 he developed abrupt chest pain/SOB and presented to the ED. He was found to have an elevated BNP >900 and mildly elevated hstrop. A CTA of the chest, abdomen, and pelvis was performed and showed no evidence of dissection.    He was seen in cardiology follow up and complained of progressive symptoms. Cardiac CT 06/02/23 showed CAC of 0, mild HALT of prosthetic valve and mild root dilation. There was also evidence of partial anomalous pulmonary venous return with evidence of an atrial septal defect and with associated right ventricular dilation.    Follow up echo 06/05/23 showed EF 55%, mod pulm HTN, S/p bioprosthetic AVR with known restricted R/L cusps with eccentric moderate to severe AI. PHT 198 ms and holodiastolic flow reversal. There was moderate  aortic valve stenosis with 27 mmHg, peak grad 44.1 mmHg, AVA 1.1 cm2, DVI 0.39.    TEE 06/25/23 showed EF 60%, moderate pericardial effusion, severe aortic regurgitation with an eccentric AI jet and a prolapsed leaflet with PHT . There was moderate AS with a mean gradient of 32 mmhg and moderate PFO with predominantly L->R shunting. Elevated aortic gradients felt to be 2/2 AI vs true aortic stenosis.    He was seen by Dr. Excell Seltzer in the office on 07/07/23. Physical exam c/w severe AS with wide pulse pressure and progressive heart failure symptoms. He was set up for [facility] 07/10/23 which showed normal right dominant coronary arteries, severely elevated LVEDP, severe AI and moderate pulmonary HTN with mean PA pressure 43 mmHg, RA pressure 16 mmHg, right heart pressure elevation likely secondary to left heart failure/severe AI (transpulmonary gradient 6 mmHg). He was admitted after cath for IV diuresis, formal TAVR CT scanning and cardiothoracic surgical consultation. He developed cardiogenic shock requiring dobutamine and norepinephrine to maintain MAP. He had  AKI (creat peaked at 3.58), elevated LFTs and lactate peaked at 4.5. He underwent urgent VIV TAVR with a 26 mm Edwards S3UR via the TF approach using Sentinel Cerebral Embolic Protection on 07/13/23. He was started on Eliquis 2.5mg  BID x 3 months followed by aspirin 81mg  daily indefinitely. POD1 echo showed EF 25%, normally functioning  TAVR with a mean gradient of 8 mmHg and no PVL. 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 He was diuresed 21 lbs with a discharge weight of 138 lbs.  Today the patient presents to clinic for follow up. Here with his wife. Here appetite is back. Weight up to 152 lbs. Has some left sided cramping that is positional when he lays on his right side. No CP or SOB. No LE edema, orthopnea or PND. No dizziness or syncope. No  blood in stool or urine. No palpitations.    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 Lynnette Caffey & Dr. Laneta Simmers   Stomach problems    Thoracic aortic aneurysm [facility])    replacement by Dr. [name] /30/07 with aortic bioprosthetic valve   Tobacco abuse     Past Surgical History:  Procedure Laterality Date   AORTIC VALVE REPLACEMENT     COLONOSCOPY WITH PROPOFOL N/A 08/19/2022   Procedure: COLONOSCOPY WITH PROPOFOL;  Surgeon: Wyline Mood, MD;  Location: [facility] ENDOSCOPY;  Service: Gastroenterology;  Laterality: N/A;   RIGHT/LEFT HEART CATH AND CORONARY ANGIOGRAPHY N/A 07/10/2023   Procedure: RIGHT/LEFT HEART CATH AND CORONARY ANGIOGRAPHY;  Surgeon: Tonny Bollman, MD;  Location: [facility] INVASIVE CV LAB;  Service: Cardiovascular;  Laterality: N/A;   TEE WITHOUT CARDIOVERSION N/A 06/25/2023   Procedure: TRANSESOPHAGEAL ECHOCARDIOGRAM;  Surgeon: Chilton Si, MD;  Location: [facility] INVASIVE CV LAB;  Service: Cardiovascular;  Laterality: N/A;   VASECTOMY  09/16/2002    Current Medications: Current Meds  Medication Sig   apixaban (ELIQUIS) 2.5 MG TABS tablet Take 1 tablet (2.5 mg total) by mouth 2 (two) times daily.   clindamycin (CLEOCIN) 300 MG capsule Take 2 tablets 1 hour prior to dental appointment   empagliflozin (JARDIANCE) 10 MG TABS tablet Take 1 tablet (10 mg total) by mouth daily.   fluticasone (FLONASE) 50 MCG/ACT nasal spray Place 1 spray into both nostrils daily as needed for rhinitis.   levocetirizine (XYZAL) 5 MG tablet Take 5 mg by mouth every evening.   losartan (COZAAR) 25 MG tablet Take 1 tablet (25 mg total) by mouth daily.   metoprolol succinate (TOPROL-XL) 25 MG 24 hr tablet Take 1 tablet (25 mg total) by mouth at bedtime.   Multiple Vitamins-Minerals (MULTIVITAMIN WITH MINERALS) tablet Take 1 tablet by mouth daily.   OVER THE  COUNTER MEDICATION Prevacid  Extra Strength taking daily   spironolactone (ALDACTONE) 25 MG tablet Take 0.5 tablets (12.5 mg total) by mouth daily.   tadalafil (CIALIS) 20 MG tablet Take 1 tablet (20 mg total) by mouth daily.      ROS:   Please see the history of present illness.    All other systems reviewed and are negative.  EKGs   EKG:  EKG is ordered today.  The ekg ordered today demonstrates sinus with LVH and repol abnormalities, ST/TW abnormalities, HR 74   Recent Labs: 07/10/2023: B Natriuretic Peptide >4,500.0 07/14/2023: Hemoglobin 15.0; Magnesium 2.4; Platelets 146 07/16/2023: ALT 258; BUN 56; Creatinine, Ser 1.56; Potassium 4.5; Sodium 137  Recent Lipid Panel    Component Value Date/Time   CHOL 150 01/16/2023 0926   CHOL 166 08/24/2019 0942   TRIG 68 01/16/2023 0926  HDL 49 01/16/2023 0926   HDL 50 08/24/2019 0942   CHOLHDL 3.1 01/16/2023 0926   LDLCALC 86 01/16/2023 0926     Risk Assessment/Calculations:            Physical Exam:    VS:  BP 110/80   Pulse 74   Ht 6\' 7"  (2.007 m)   Wt 152 lb (68.9 kg)   SpO2 98%   BMI 17.12 kg/m     Wt Readings from Last 3 Encounters:  07/23/23 152 lb (68.9 kg)  07/22/23 150 lb (68 kg)  07/18/23 150 lb (68 kg)     GEN: tall thin, white male NECK: No JVD; No carotid bruits CARDIAC: RRR, soft flow murmur @ RUSB. No rubs, gallops RESPIRATORY:  Clear to auscultation without rales, wheezing or rhonchi  ABDOMEN: Soft, non-tender, non-distended EXTREMITIES:  No edema; No deformity   ASSESSMENT:    1. S/P VIV TAVR (transcatheter aortic valve replacement)   2. HFrEF (heart failure with reduced ejection fraction) (HCC)   3. Stage 3a chronic kidney disease (HCC)   4. Elevated LFTs    PLAN:    In order of problems listed above:  Severe bioprosthetic aortic valve failure with severe AI s/p VIV TAVR: doing great 1 week out from VIV TAVR. Groin sites healing well. ECG with no HAVB. Continue Eliquis 2.5mg  BID x 3  months followed by aspirin 81mg  daily indefinitely. He has clindamycin for SBE prophylaxis. I will see him back for 1 month follow up and echo.    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 Weight is up but he is finally able to eat. He does not have any s/s CHF. Continue on Losartan 25mg  daily, Toprol XL 25mg  at bedtime, Jardiance 10mg  daily. Add spiro 12.5mg  daily today.    CKD stage IIIa: creat 1.56 at discharge. Recheck BMET today.    Elevated LFTs: due to congestion. AST/ALT 116/258 at discharge. Atorvastatin 20mg  daily held with elevated LFTs. Recheck today and resume statin if normalized.      Cardiac Rehabilitation Eligibility Assessment  The patient is ready to start cardiac rehabilitation from a cardiac standpoint.      Medication Adjustments/Labs and Tests Ordered: Current medicines are reviewed at length with the patient today.  Concerns regarding medicines are outlined above.  Orders Placed This Encounter  Procedures   CBC   Comprehensive metabolic panel   EKG 12-Lead   Meds ordered this encounter  Medications   spironolactone (ALDACTONE) 25 MG tablet    Sig: Take 0.5 tablets (12.5 mg total) by mouth daily.    Dispense:  45 tablet    Refill:  3    Patient Instructions  Medication Instructions:  Your physician has recommended you make the following change in your medication:  1) START taking spironolactone 12.5 mg daily *If you need a refill on your cardiac medications before your next appointment, please call your pharmacy*  Lab Work: TODAY: BMET and CBC If you have labs (blood work) drawn today and your tests are completely normal, you will receive your results only by: MyChart Message (if you have MyChart) OR A paper copy in the mail If you have any lab test that is abnormal or we need to change your treatment, we will call you to review the results.  Follow-Up: At [facility], you and your health needs are our priority.  As part  of our continuing mission to provide you with exceptional heart care, we have created designated  Provider Care Teams.  These Care Teams include your primary Cardiologist (physician) and Advanced Practice Providers (APPs -  Physician Assistants and Nurse Practitioners) who all work together to provide you with the care you need, when you need it.  Your next appointment:   Follow up as scheduled    Signed, Cline Crock, PA-C  07/23/2023 2:15 PM    [facility]

## 2023-07-22 NOTE — Progress Notes (Signed)
Subjective:    Patient ID: Christopher Ball, male    DOB: 1971/09/04, 52 y.o.   MRN: [id number]  HPI  Patient presents to clinic today for his annual exam.  Flu: 07/2022 Tetanus: 09/2021 COVID: X 2 Pneumovax: 07/2012 Shingrix: Never PSA screening: 07/2022 Colon screening: 08/2022 Vision screening: annually Dentist: biannually  Diet: He does eat meat. He consumes fruits and veggies. He does eat fried foods. He drinks mostly sweet tea. 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 Lynnette Caffey & Dr. Laneta Simmers   Stomach problems    Thoracic aortic aneurysm [facility])    replacement by Dr. [name] /30/07 with aortic bioprosthetic valve   Tobacco abuse     Current Outpatient Medications  Medication Sig Dispense Refill   apixaban (ELIQUIS) 2.5 MG TABS tablet Take 1 tablet (2.5 mg total) by mouth 2 (two) times daily. 90 tablet 0   clindamycin (CLEOCIN) 300 MG capsule Take 2 tablets 1 hour prior to dental appointment 2 capsule 3   empagliflozin (JARDIANCE) 10 MG TABS tablet Take 1 tablet (10 mg total) by mouth daily. 30 tablet 6   fluticasone (FLONASE) 50 MCG/ACT nasal spray Place 1 spray into both nostrils daily as needed for rhinitis.     levocetirizine (XYZAL) 5 MG tablet Take 5 mg by mouth every evening.     losartan (COZAAR) 25 MG tablet Take 1 tablet (25 mg total) by mouth daily. 30 tablet 6   metoprolol succinate (TOPROL-XL) 25 MG 24 hr tablet Take 1 tablet (25 mg total) by mouth at bedtime. 30 tablet 6   Multiple Vitamins-Minerals (MULTIVITAMIN WITH MINERALS) tablet Take 1 tablet by mouth daily.     tadalafil (CIALIS) 20 MG tablet Take 1 tablet (20 mg total) by mouth daily. 10 tablet 1   No current facility-administered medications for this visit.    Allergies  Allergen Reactions    Augmentin [Amoxicillin-Pot Clavulanate] Shortness Of Breath and Nausea And Vomiting    Family History  Problem Relation Age of Onset   Asthma Mother    Early death Father    Heart disease Father    Asthma Brother    Diabetes Maternal Grandmother    Cancer Neg Hx    Stroke Neg Hx    Kidney disease Neg Hx    Hypertension Neg Hx    Hyperlipidemia Neg Hx     Social History   Socioeconomic History   Marital status: Married    Spouse name: Not on file   Number of children: Not on file   Years of education: Not on file   Highest education level: Not on file  Occupational History   Not on file  Tobacco Use   Smoking status: Former    Average packs/day: 1 pack/day for 31.0 years (31.0 ttl pk-yrs)    Types: Cigars, Cigarettes    Start date: 07/09/1992   Smokeless tobacco: Never  Vaping Use   Vaping status: Every Day  Substance and Sexual Activity   Alcohol use: Yes    Alcohol/week: 1.0 standard drink of alcohol    Types: 1 Shots of liquor per week    Comment: rare   Drug use: Yes    Types: Marijuana   Sexual activity: Yes  Other Topics Concern  Not on file  Social History Narrative   Not on file   Social Determinants of Health   Financial Resource Strain: Not on file  Food Insecurity: No Food Insecurity (07/11/2023)   Hunger Vital Sign    Worried About Running Out of Food in the Last Year: Never true    Ran Out of Food in the Last Year: Never true  Transportation Needs: No Transportation Needs (07/11/2023)   PRAPARE - Administrator, Civil Service (Medical): No    Lack of Transportation (Non-Medical): No  Physical Activity: Not on file  Stress: Not on file  Social Connections: Not on file  Intimate Partner Violence: Not At Risk (07/11/2023)   Humiliation, Afraid, Rape, and Kick questionnaire    Fear of Current or Ex-Partner: No    Emotionally Abused: No    Physically Abused: No    Sexually Abused: No     Constitutional: Denies fever, malaise,  fatigue, headache or abrupt weight changes.  HEENT: Denies eye pain, eye redness, ear pain, ringing in the ears, wax buildup, runny nose, nasal congestion, bloody nose, or sore throat. Respiratory: Denies difficulty breathing, shortness of breath, cough or sputum production.   Cardiovascular: Denies chest pain, chest tightness, palpitations or swelling in the hands or feet.  Gastrointestinal: Denies abdominal pain, bloating, constipation, diarrhea or blood in the stool.  GU: Denies urgency, frequency, pain with urination, burning sensation, blood in urine, odor or discharge. Musculoskeletal: Denies decrease in range of motion, difficulty with gait, muscle pain or joint pain and swelling.  Skin: Denies redness, rashes, lesions or ulcercations.  Neurological: Denies dizziness, difficulty with memory, difficulty with speech or problems with balance and coordination.  Psych: Denies anxiety, depression, SI/HI.  No other specific complaints in a complete review of systems (except as listed in HPI above).  Objective:   Physical Exam  BP 112/70   Pulse 67   Ht 6\' 6"  (1.981 m)   Wt 150 lb (68 kg)   SpO2 97%   BMI 17.33 kg/m   Wt Readings from Last 3 Encounters:  07/18/23 150 lb (68 kg)  07/16/23 134 lb 4.2 oz (60.9 kg)  07/07/23 147 lb 6.4 oz (66.9 kg)    General: Appears his stated age, underweight, in NAD. Skin: Warm, dry and intact.  HEENT: Head: normal shape and size; Eyes: sclera white, no icterus, conjunctiva pink, PERRLA and EOMs intact;  Neck:  Neck supple, trachea midline. No masses, lumps or thyromegaly present.  Cardiovascular: Normal rate and rhythm. S1,S2 noted.  No murmur, rubs or gallops noted. No JVD or BLE edema. No carotid bruits noted. Pulmonary/Chest: Normal effort and positive vesicular breath sounds. No respiratory distress. No wheezes, rales or ronchi noted.  Abdomen: Soft and nontender. Normal bowel sounds.  Musculoskeletal: Strength 5/5 BUE/BLE.  No difficulty  with gait.  Neurological: Alert and oriented. Cranial nerves II-XII grossly intact. Coordination normal.  Psychiatric: Mood and affect mildly flat. Behavior is normal. Judgment and thought content normal.   BMET    Component Value Date/Time   NA 137 07/16/2023 0620   NA 146 (H) 06/23/2023 1123   K 4.5 07/16/2023 0620   CL 102 07/16/2023 0620   CO2 28 07/16/2023 0620   GLUCOSE 119 (H) 07/16/2023 0620   BUN 56 (H) 07/16/2023 0620   BUN 29 (H) 06/23/2023 1123   CREATININE 1.56 (H) 07/16/2023 0620   CREATININE 1.16 01/16/2023 0926   CALCIUM 8.8 (L) 07/16/2023 0620   GFRNONAA 53 (L) 07/16/2023  4098   GFRAA 78 06/23/2017 0917    Lipid Panel     Component Value Date/Time   CHOL 150 01/16/2023 0926   CHOL 166 08/24/2019 0942   TRIG 68 01/16/2023 0926   HDL 49 01/16/2023 0926   HDL 50 08/24/2019 0942   CHOLHDL 3.1 01/16/2023 0926   LDLCALC 86 01/16/2023 0926    CBC    Component Value Date/Time   WBC 12.4 (H) 07/14/2023 0421   RBC 4.70 07/14/2023 0421   HGB 15.0 07/14/2023 0421   HGB 13.2 06/23/2023 1123   HCT 45.3 07/14/2023 0421   HCT 41.0 06/23/2023 1123   PLT 146 (L) 07/14/2023 0421   PLT 187 06/23/2023 1123   MCV 96.4 07/14/2023 0421   MCV 101 (H) 06/23/2023 1123   MCH 31.9 07/14/2023 0421   MCHC 33.1 07/14/2023 0421   RDW 13.3 07/14/2023 0421   RDW 11.9 06/23/2023 1123   LYMPHSABS 2.3 01/01/2014 2000   MONOABS 0.6 01/01/2014 2000   EOSABS 0.4 01/01/2014 2000   BASOSABS 0.1 01/01/2014 2000    Hgb A1C Lab Results  Component Value Date   HGBA1C 5.6 07/13/2023            Assessment & Plan:   Preventative health maintenance:  Flu shot today Tetanus UTD Encouraged him to get his COVID booster Discussed Shingrix vaccine, he will check coverage with his insurance company and schedule a visit if he would like to have this done Colon screening UTD Encouraged him to consume a balanced diet and exercise regimen Advised him to see an eye doctor and  dentist annually Recent labs reviewed  RTC in 6 months, follow-up chronic conditions Nicki Reaper, NP

## 2023-07-22 NOTE — Patient Instructions (Signed)
Health Maintenance, Male Adopting a healthy lifestyle and getting preventive care are important in promoting health and wellness. Ask your health care provider about: The right schedule for you to have regular tests and exams. Things you can do on your own to prevent diseases and keep yourself healthy. What should I know about diet, weight, and exercise? Eat a healthy diet  Eat a diet that includes plenty of vegetables, fruits, low-fat dairy products, and lean protein. Do not eat a lot of foods that are high in solid fats, added sugars, or sodium. Maintain a healthy weight Body mass index (BMI) is a measurement that can be used to identify possible weight problems. It estimates body fat based on height and weight. Your health care provider can help determine your BMI and help you achieve or maintain a healthy weight. Get regular exercise Get regular exercise. This is one of the most important things you can do for your health. Most adults should: Exercise for at least 150 minutes each week. The exercise should increase your heart rate and make you sweat (moderate-intensity exercise). Do strengthening exercises at least twice a week. This is in addition to the moderate-intensity exercise. Spend less time sitting. Even light physical activity can be beneficial. Watch cholesterol and blood lipids Have your blood tested for lipids and cholesterol at 51 years of age, then have this test every 5 years. You may need to have your cholesterol levels checked more often if: Your lipid or cholesterol levels are high. You are older than 51 years of age. You are at high risk for heart disease. What should I know about cancer screening? Many types of cancers can be detected early and may often be prevented. Depending on your health history and family history, you may need to have cancer screening at various ages. This may include screening for: Colorectal cancer. Prostate cancer. Skin cancer. Lung  cancer. What should I know about heart disease, diabetes, and high blood pressure? Blood pressure and heart disease High blood pressure causes heart disease and increases the risk of stroke. This is more likely to develop in people who have high blood pressure readings or are overweight. Talk with your health care provider about your target blood pressure readings. Have your blood pressure checked: Every 3-5 years if you are 18-39 years of age. Every year if you are 40 years old or older. If you are between the ages of 65 and 75 and are a current or former smoker, ask your health care provider if you should have a one-time screening for abdominal aortic aneurysm (AAA). Diabetes Have regular diabetes screenings. This checks your fasting blood sugar level. Have the screening done: Once every three years after age 45 if you are at a normal weight and have a low risk for diabetes. More often and at a younger age if you are overweight or have a high risk for diabetes. What should I know about preventing infection? Hepatitis B If you have a higher risk for hepatitis B, you should be screened for this virus. Talk with your health care provider to find out if you are at risk for hepatitis B infection. Hepatitis C Blood testing is recommended for: Everyone born from 1945 through 1965. Anyone with known risk factors for hepatitis C. Sexually transmitted infections (STIs) You should be screened each year for STIs, including gonorrhea and chlamydia, if: You are sexually active and are younger than 51 years of age. You are older than 51 years of age and your   health care provider tells you that you are at risk for this type of infection. Your sexual activity has changed since you were last screened, and you are at increased risk for chlamydia or gonorrhea. Ask your health care provider if you are at risk. Ask your health care provider about whether you are at high risk for HIV. Your health care provider  may recommend a prescription medicine to help prevent HIV infection. If you choose to take medicine to prevent HIV, you should first get tested for HIV. You should then be tested every 3 months for as long as you are taking the medicine. Follow these instructions at home: Alcohol use Do not drink alcohol if your health care provider tells you not to drink. If you drink alcohol: Limit how much you have to 0-2 drinks a day. Know how much alcohol is in your drink. In the [place] one drink equals one 12 oz bottle of beer (355 mL), one 5 oz glass of wine (148 mL), or one 1 oz glass of hard liquor (44 mL). Lifestyle Do not use any products that contain nicotine or tobacco. These products include cigarettes, chewing tobacco, and vaping devices, such as e-cigarettes. If you need help quitting, ask your health care provider. Do not use street drugs. Do not share needles. Ask your health care provider for help if you need support or information about quitting drugs. General instructions Schedule regular health, dental, and eye exams. Stay current with your vaccines. Tell your health care provider if: You often feel depressed. You have ever been abused or do not feel safe at home. Summary Adopting a healthy lifestyle and getting preventive care are important in promoting health and wellness. Follow your health care provider's instructions about healthy diet, exercising, and getting tested or screened for diseases. Follow your health care provider's instructions on monitoring your cholesterol and blood pressure. This information is not intended to replace advice given to you by your health care provider. Make sure you discuss any questions you have with your health care provider. Document Revised: 01/22/2021 Document Reviewed: 01/22/2021 Elsevier Patient Education  2024 Elsevier Inc.  

## 2023-07-23 ENCOUNTER — Encounter (HOSPITAL_COMMUNITY): Payer: Self-pay | Admitting: *Deleted

## 2023-07-23 ENCOUNTER — Other Ambulatory Visit: Payer: Self-pay

## 2023-07-23 ENCOUNTER — Emergency Department (HOSPITAL_COMMUNITY)
Admission: EM | Admit: 2023-07-23 | Discharge: 2023-07-23 | Disposition: A | Discharge status: Home / Self Care | Payer: Federal, State, Local not specified - PPO | Attending: Emergency Medicine | Admitting: Emergency Medicine

## 2023-07-23 ENCOUNTER — Telehealth: Payer: Self-pay | Admitting: Cardiology

## 2023-07-23 ENCOUNTER — Ambulatory Visit: Payer: Federal, State, Local not specified - PPO | Attending: Physician Assistant | Admitting: Physician Assistant

## 2023-07-23 VITALS — BP 110/80 | HR 74 | Ht 79.0 in | Wt 152.0 lb

## 2023-07-23 DIAGNOSIS — I5020 Unspecified systolic (congestive) heart failure: Secondary | ICD-10-CM | POA: Diagnosis not present

## 2023-07-23 DIAGNOSIS — I509 Heart failure, unspecified: Secondary | ICD-10-CM | POA: Insufficient documentation

## 2023-07-23 DIAGNOSIS — Z7984 Long term (current) use of oral hypoglycemic drugs: Secondary | ICD-10-CM | POA: Insufficient documentation

## 2023-07-23 DIAGNOSIS — M7989 Other specified soft tissue disorders: Secondary | ICD-10-CM | POA: Insufficient documentation

## 2023-07-23 DIAGNOSIS — R7989 Other specified abnormal findings of blood chemistry: Secondary | ICD-10-CM | POA: Diagnosis not present

## 2023-07-23 DIAGNOSIS — Z7901 Long term (current) use of anticoagulants: Secondary | ICD-10-CM | POA: Insufficient documentation

## 2023-07-23 DIAGNOSIS — N1831 Chronic kidney disease, stage 3a: Secondary | ICD-10-CM

## 2023-07-23 DIAGNOSIS — E119 Type 2 diabetes mellitus without complications: Secondary | ICD-10-CM | POA: Insufficient documentation

## 2023-07-23 DIAGNOSIS — M79632 Pain in left forearm: Secondary | ICD-10-CM | POA: Insufficient documentation

## 2023-07-23 DIAGNOSIS — Z952 Presence of prosthetic heart valve: Secondary | ICD-10-CM

## 2023-07-23 LAB — BASIC METABOLIC PANEL
Anion gap: 11 (ref 5–15)
BUN: 32 mg/dL — ABNORMAL HIGH (ref 6–20)
CO2: 20 mmol/L — ABNORMAL LOW (ref 22–32)
Calcium: 9.5 mg/dL (ref 8.9–10.3)
Chloride: 107 mmol/L (ref 98–111)
Creatinine, Ser: 1.14 mg/dL (ref 0.61–1.24)
GFR, Estimated: >60 mL/min (ref >60)
Glucose, Bld: 110 mg/dL — ABNORMAL HIGH (ref 70–99)
Potassium: 4.7 mmol/L (ref 3.5–5.1)
Sodium: 138 mmol/L (ref 135–145)

## 2023-07-23 LAB — CBC
HCT: 38.8 % — ABNORMAL LOW (ref 39.0–52.0)
Hemoglobin: 12.4 g/dL — ABNORMAL LOW (ref 13.0–17.0)
MCH: 32 pg (ref 26.0–34.0)
MCHC: 32 g/dL (ref 30.0–36.0)
MCV: 100.3 fL — ABNORMAL HIGH (ref 80.0–100.0)
Platelets: 294 10*3/uL (ref 150–400)
RBC: 3.87 MIL/uL — ABNORMAL LOW (ref 4.22–5.81)
RDW: 13.9 % (ref 11.5–15.5)
WBC: 9.8 10*3/uL (ref 4.0–10.5)
nRBC: 0 % (ref 0.0–0.2)

## 2023-07-23 LAB — MAGNESIUM: Magnesium: 2.3 mg/dL (ref 1.7–2.4)

## 2023-07-23 MED ORDER — SPIRONOLACTONE 25 MG PO TABS: 12.5000 mg | ORAL_TABLET | Freq: Every day | ORAL | 3 refills | Status: DC

## 2023-07-23 NOTE — Telephone Encounter (Signed)
Outpatient service line: Chief complaint: Left arm numbness  Currently status post TAVR on 07/13/2023 with hospitalization complicated by cardiogenic shock.  Today he is reporting numbness in his left arm from his elbow into his wrist but not involving the fingers.  He now states that the numbness is progressing to the posterior side of his forearm.  He does state he did have labs drawn today.  I think it is possible that this could be a compression of a nerve or a nerve injury from IV insertion today.  However, he did have multiple IV accesses in that same arm from previous admission.  Patient significantly concerned about this and has decided to go to the emergency room for further evaluation.  Attaching provider today to make aware.

## 2023-07-23 NOTE — ED Triage Notes (Signed)
The pt had blood work removed from his lt a-c today  he started  having numbness in his lt arm  he had aortic surgery October 27th he has a radial pulse some swelling to his lt arm  and his wedding band is swollen on the lt ring finger

## 2023-07-23 NOTE — Discharge Instructions (Signed)
Your labs were unremarkable today  As we discussed, at you should apply ice to help with the swelling and the numbness should improve.  See your doctor for follow-up.  Please continue taking your Eliquis  Return to ER if you have worse pain or swelling or numbness

## 2023-07-23 NOTE — ED Provider Triage Note (Signed)
Emergency Medicine Provider Triage Evaluation Note  Christopher Ball , a 52 y.o. male  was evaluated in triage.  Pt complains of left arm swelling.  Patient reports numbness and tingling on his left elbow to his left wrist associated swelling which started about 3 hours prior to arrival.  Patient states that he had blood work done this afternoon on that arm but the swelling, numbness, and tingling did not start until later on.  He did undergo TAVR procedure last week. He takes Eliquis daily and denies any recent missed doses.  Review of Systems  Positive: Left forearm swelling, numbness/tingling Negative: Chest pain, shortness of breath  Physical Exam  BP 123/71 (BP Location: Right Arm)   Pulse 75   Temp 97.9 F (36.6 C)   Resp 19   Ht 6\' 7"  (2.007 m)   Wt 68.9 kg   SpO2 95%   BMI 17.11 kg/m  Gen:   Awake, no distress   Resp:  Normal effort  MSK:   Moves extremities without difficulty  Other:    Medical Decision Making  Medically screening exam initiated at 9:19 PM.  Appropriate orders placed.  Christopher Ball was informed that the remainder of the evaluation will be completed by another provider, this initial triage assessment does not replace that evaluation, and the importance of remaining in the ED until their evaluation is complete.   Maxwell Marion, PA-C 07/23/23 2124

## 2023-07-23 NOTE — Patient Instructions (Signed)
Medication Instructions:  Your physician has recommended you make the following change in your medication:  1) START taking spironolactone 12.5 mg daily *If you need a refill on your cardiac medications before your next appointment, please call your pharmacy*  Lab Work: TODAY: BMET and CBC If you have labs (blood work) drawn today and your tests are completely normal, you will receive your results only by: MyChart Message (if you have MyChart) OR A paper copy in the mail If you have any lab test that is abnormal or we need to change your treatment, we will call you to review the results.  Follow-Up: At [facility], you and your health needs are our priority.  As part of our continuing mission to provide you with exceptional heart care, we have created designated Provider Care Teams.  These Care Teams include your primary Cardiologist (physician) and Advanced Practice Providers (APPs -  Physician Assistants and Nurse Practitioners) who all work together to provide you with the care you need, when you need it.  Your next appointment:   Follow up as scheduled

## 2023-07-23 NOTE — ED Provider Notes (Signed)
[place] EMERGENCY DEPARTMENT AT [facility] Provider Note   CSN: [id number] Arrival date & time: 07/23/23  1910     History  Chief Complaint  Patient presents with   swollen lt arm    Christopher Ball is a 52 y.o. male history of diabetes, Marfan syndrome, heart failure, TAVR (10/27) here presenting with left arm pain and numbness.  Patient states that he just had blood drawn at the doctor's office around 2 PM today.  He states that he was stuck in the left antecubital area.  He states around 5 PM he has some numbness of the left forearm.  He also notes some swelling of the left arm as well.  He states that he just had TAVR and is on Eliquis.  Denies any chest pain or shortness of breath.  The history is provided by the patient.       Home Medications Prior to Admission medications   Medication Sig Start Date End Date Taking? Authorizing Provider  apixaban (ELIQUIS) 2.5 MG TABS tablet Take 1 tablet (2.5 mg total) by mouth 2 (two) times daily. 07/16/23  Yes Janetta Hora, PA-C  clindamycin (CLEOCIN) 300 MG capsule Take 2 tablets 1 hour prior to dental appointment 06/18/22  Yes [name], MD  empagliflozin (JARDIANCE) 10 MG TABS tablet Take 1 tablet (10 mg total) by mouth daily. 07/16/23  Yes Janetta Hora, PA-C  fluticasone (FLONASE) 50 MCG/ACT nasal spray Place 1 spray into both nostrils daily as needed for rhinitis.   Yes [provider]  levocetirizine (XYZAL) 5 MG tablet Take 5 mg by mouth every evening.   Yes [provider]  losartan (COZAAR) 25 MG tablet Take 1 tablet (25 mg total) by mouth daily. 07/16/23  Yes Janetta Hora, PA-C  metoprolol succinate (TOPROL-XL) 25 MG 24 hr tablet Take 1 tablet (25 mg total) by mouth at bedtime. 07/16/23  Yes Janetta Hora, PA-C  Multiple Vitamins-Minerals (MULTIVITAMIN WITH MINERALS) tablet Take 1 tablet by mouth daily.   Yes [provider]  OVER THE COUNTER MEDICATION  Take 1 tablet by mouth daily. Medication: Prevacid  Extra Strength taking daily   Yes [provider]  spironolactone (ALDACTONE) 25 MG tablet Take 0.5 tablets (12.5 mg total) by mouth daily. 07/23/23  Yes Janetta Hora, PA-C  tadalafil (CIALIS) 20 MG tablet Take 1 tablet (20 mg total) by mouth daily. 03/07/23  Yes Lorre Munroe, NP      Allergies    Augmentin [amoxicillin-pot clavulanate]    Review of Systems   Review of Systems  Musculoskeletal:        L arm pain   All other systems reviewed and are negative.   Physical Exam Updated Vital Signs BP 123/71 (BP Location: Right Arm)   Pulse 75   Temp 97.9 F (36.6 C)   Resp 19   Ht 6\' 7"  (2.007 m)   Wt 68.9 kg   SpO2 95%   BMI 17.11 kg/m  Physical Exam Vitals and nursing note reviewed.  Constitutional:      Appearance: Normal appearance.     Comments: Chronically ill but not acutely ill  HENT:     Head: Normocephalic.     Nose: Nose normal.     Mouth/Throat:     Mouth: Mucous membranes are moist.  Eyes:     Extraocular Movements: Extraocular movements intact.     Pupils: Pupils are equal, round, and reactive to light.  Cardiovascular:  Rate and Rhythm: Normal rate and regular rhythm.     Pulses: Normal pulses.     Heart sounds: Normal heart sounds.  Pulmonary:     Effort: Pulmonary effort is normal.     Breath sounds: Normal breath sounds.  Abdominal:     General: Abdomen is flat.     Palpations: Abdomen is soft.  Musculoskeletal:     Cervical back: Normal range of motion and neck supple.     Comments: Minimal left forearm swelling.  Patient has subjective decrease sensation of the entire left forearm.  Patient has 2+ radial pulse.  Patient has normal hand grasp.  Normal biceps flexion and tricep extension.  Skin:    General: Skin is warm.     Capillary Refill: Capillary refill takes less than 2 seconds.  Neurological:     General: No focal deficit present.     Mental Status: He is alert and  oriented to person, place, and time.  Psychiatric:        Mood and Affect: Mood normal.        Behavior: Behavior normal.     ED Results / Procedures / Treatments   Labs (all labs ordered are listed, but only abnormal results are displayed) Labs Reviewed  CBC - Abnormal; Notable for the following components:      Result Value   RBC 3.87 (*)    Hemoglobin 12.4 (*)    HCT 38.8 (*)    MCV 100.3 (*)    All other components within normal limits  BASIC METABOLIC PANEL  MAGNESIUM    EKG None  Radiology No results found.  Procedures Procedures    EMERGENCY DEPARTMENT US SOFT TISSUE INTERPRETATION "Study: Limited Soft Tissue Ultrasound"  INDICATIONS: Pain Multiple views of the body part were obtained in real-time with a multi-frequency linear probe  PERFORMED BY: Myself IMAGES ARCHIVED?: Yes SIDE:Left BODY PART:Upper extremity INTERPRETATION:   no obvious DVT or thrombophlebitis     Medications Ordered in ED Medications - No data to display  ED Course/ Medical Decision Making/ A&P                                 Medical Decision Making Christopher Ball is a 52 y.o. male here presenting with left forearm swelling and subjective numbness.  Patient had labs drawn this afternoon.  I performed bedside ultrasound I do not see any obvious hematoma or thrombophlebitis or DVT.  I wonder if when they draw the blood work, maybe they hit a nerve so he has temporary numbness.  Plan to check electrolytes as well.  10:31 PM Labs unremarkable.  I told patient to put some ice to help with the swelling.  Gave strict return precautions  Problems Addressed: Pain and swelling of forearm, left: acute illness or injury   Final Clinical Impression(s) / ED Diagnoses Final diagnoses:  None    Rx / DC Orders ED Discharge Orders     None         Charlynne Pander, MD 07/23/23 2231

## 2023-07-24 ENCOUNTER — Encounter: Payer: Federal, State, Local not specified - PPO | Admitting: Thoracic Surgery (Cardiothoracic Vascular Surgery)

## 2023-07-24 ENCOUNTER — Other Ambulatory Visit: Payer: Self-pay | Admitting: Physician Assistant

## 2023-07-24 DIAGNOSIS — I5020 Unspecified systolic (congestive) heart failure: Secondary | ICD-10-CM

## 2023-07-24 LAB — COMPREHENSIVE METABOLIC PANEL
ALT: 176 [IU]/L — ABNORMAL HIGH (ref 0–44)
AST: 71 [IU]/L — ABNORMAL HIGH (ref 0–40)
Albumin: 3.9 g/dL (ref 3.8–4.9)
Alkaline Phosphatase: 156 [IU]/L — ABNORMAL HIGH (ref 44–121)
BUN/Creatinine Ratio: 27 — ABNORMAL HIGH (ref 9–20)
BUN: 27 mg/dL — ABNORMAL HIGH (ref 6–24)
Bilirubin Total: 0.3 mg/dL (ref 0.0–1.2)
CO2: 21 mmol/L (ref 20–29)
Calcium: 9.5 mg/dL (ref 8.7–10.2)
Chloride: 105 mmol/L (ref 96–106)
Creatinine, Ser: 1 mg/dL (ref 0.76–1.27)
Globulin, Total: 2.8 g/dL (ref 1.5–4.5)
Glucose: 107 mg/dL — ABNORMAL HIGH (ref 70–99)
Potassium: 5 mmol/L (ref 3.5–5.2)
Sodium: 140 mmol/L (ref 134–144)
Total Protein: 6.7 g/dL (ref 6.0–8.5)
eGFR: 91 mL/min/{1.73_m2} (ref >59)

## 2023-07-24 LAB — CBC
Hematocrit: 39.1 % (ref 37.5–51.0)
Hemoglobin: 12.6 g/dL — ABNORMAL LOW (ref 13.0–17.7)
MCH: 32.2 pg (ref 26.6–33.0)
MCHC: 32.2 g/dL (ref 31.5–35.7)
MCV: 100 fL — ABNORMAL HIGH (ref 79–97)
Platelets: 289 10*3/uL (ref 150–450)
RBC: 3.91 x10E6/uL — ABNORMAL LOW (ref 4.14–5.80)
RDW: 11.8 % (ref 11.6–15.4)
WBC: 10.5 10*3/uL (ref 3.4–10.8)

## 2023-07-25 ENCOUNTER — Ambulatory Visit (HOSPITAL_COMMUNITY)
Admit: 2023-07-25 | Discharge: 2023-07-25 | Disposition: A | Discharge status: Home / Self Care | Payer: Federal, State, Local not specified - PPO | Source: Ambulatory Visit | Attending: Cardiology | Admitting: Cardiology

## 2023-07-25 ENCOUNTER — Encounter (HOSPITAL_COMMUNITY): Payer: Self-pay | Admitting: Cardiology

## 2023-07-25 VITALS — BP 126/72 | HR 63 | Wt 151.8 lb

## 2023-07-25 DIAGNOSIS — Z952 Presence of prosthetic heart valve: Secondary | ICD-10-CM | POA: Diagnosis not present

## 2023-07-25 DIAGNOSIS — Q2111 Secundum atrial septal defect: Secondary | ICD-10-CM

## 2023-07-25 DIAGNOSIS — I5022 Chronic systolic (congestive) heart failure: Secondary | ICD-10-CM | POA: Insufficient documentation

## 2023-07-25 DIAGNOSIS — I700 Atherosclerosis of aorta: Secondary | ICD-10-CM | POA: Diagnosis not present

## 2023-07-25 DIAGNOSIS — Q263 Partial anomalous pulmonary venous connection: Secondary | ICD-10-CM | POA: Diagnosis not present

## 2023-07-25 DIAGNOSIS — I130 Hypertensive heart and chronic kidney disease with heart failure and stage 1 through stage 4 chronic kidney disease, or unspecified chronic kidney disease: Secondary | ICD-10-CM | POA: Insufficient documentation

## 2023-07-25 DIAGNOSIS — Z7901 Long term (current) use of anticoagulants: Secondary | ICD-10-CM | POA: Diagnosis not present

## 2023-07-25 DIAGNOSIS — Q211 Atrial septal defect, unspecified: Secondary | ICD-10-CM | POA: Diagnosis not present

## 2023-07-25 DIAGNOSIS — N1831 Chronic kidney disease, stage 3a: Secondary | ICD-10-CM | POA: Diagnosis not present

## 2023-07-25 DIAGNOSIS — Q8740 Marfan's syndrome, unspecified: Secondary | ICD-10-CM | POA: Diagnosis not present

## 2023-07-25 MED ORDER — LOSARTAN POTASSIUM 50 MG PO TABS: 50.0000 mg | ORAL_TABLET | Freq: Every day | ORAL | 3 refills | Status: DC

## 2023-07-25 NOTE — Patient Instructions (Signed)
Medication Changes:  INCREASE LOSARTAN TO 50MG  ONCE DAILY   Follow-Up in: 3 MONTH FOLLOW UP PLEASE CALL OUR OFFICE AROUND DECEMBER TO GET SCHEDULED FOR YOUR APPOINTMENT. PHONE NUMBER IS [phone] OPTION 2   At the [facility], you and your health needs are our priority. We have a designated team specialized in the treatment of Heart Failure. This Care Team includes your primary Heart Failure Specialized Cardiologist (physician), Advanced Practice Providers (APPs- Physician Assistants and Nurse Practitioners), and Pharmacist who all work together to provide you with the care you need, when you need it.   You may see any of the following providers on your designated Care Team at your next follow up:  Dr. Arvilla Meres Dr. Marca Ancona Dr. Dorthula Nettles Dr. Theresia Bough Tonye Becket, NP Robbie Lis, [place] [facility] [place], [place] Brynda Peon, NP [place] Lee, NP Karle Plumber, PharmD   Please be sure to bring in all your medications bottles to every appointment.   Need to Contact us:  If you have any questions or concerns before your next appointment please send [place] a message through [place] or call our office at [phone].    TO LEAVE A MESSAGE FOR THE NURSE SELECT OPTION 2, PLEASE LEAVE A MESSAGE INCLUDING: YOUR NAME DATE OF BIRTH CALL BACK NUMBER REASON FOR CALL**this is important as we prioritize the call backs  YOU WILL RECEIVE A CALL BACK THE SAME DAY AS LONG AS YOU CALL BEFORE 4:00 PM

## 2023-07-25 NOTE — Progress Notes (Signed)
ADVANCED HEART FAILURE NEW PATIENT CLINIC NOTE  Referring Physician: Lorre Munroe, NP  Primary Care: Lorre Munroe, NP Primary Cardiologist:  HPI: Christopher Ball is a 52 y.o. 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      Christopher Ball underwent aortic root replacement with a Medtronic freestyle 27 mm aortic valve and ascending aortic replacement with a 24 mm Hemashield graft with coronary artery reimplantation in 2007 with Dr. [name]. He refused mechanical valve with obligate long term Coumadin at the time.    In August 2024 he developed abrupt chest pain/SOB and presented to the ED. He was found to have an elevated BNP >900 and mildly elevated hstrop. A CTA of the chest, abdomen, and pelvis was performed and showed no evidence of dissection.    He was seen in cardiology follow up and complained of progressive symptoms. Cardiac CT 06/02/23 showed CAC of 0, mild HALT of prosthetic valve and mild root dilation. There was also evidence of partial anomalous pulmonary venous return with evidence of an atrial septal defect and with associated right ventricular dilation.    Follow up echo 06/05/23 showed EF 55%, mod pulm HTN, S/p bioprosthetic AVR with known restricted R/L cusps with eccentric moderate to severe AI. PHT 198 ms and holodiastolic flow reversal. There was moderate aortic valve stenosis with 27 mmHg, peak grad 44.1 mmHg, AVA 1.1 cm2, DVI 0.39.    TEE 06/25/23 showed EF 60%, moderate pericardial effusion, severe aortic regurgitation with an eccentric AI jet and a prolapsed leaflet with PHT  . There was moderate AS with a mean gradient of 32 mmhg and moderate PFO with predominantly L->R shunting. Elevated aortic gradients felt to be 2/2 AI vs true aortic stenosis. He underwent RHC with biventricular pressure elevation and severely reduced cardiac index by Fick despite left to right shunt. He was admitted for diuresis, responded poorly initially then developed shock requiring dobutamine and norepinephrine to maintain urine output. Lactate improved and patient underwent ViV emergent TAVR on 10/27 with a 26mm Edwards valve. Improved following, though EF had a significant drop following the procedure.       SUBJECTIVE: Patient overall states he has been doing well, he has been intentionally gaining weight without any lower extremity swelling, shortness of breath, chest pain.  He feels his activity level is increasing though he still feels a bit slow.  He denies any issues with his medications and has not had any dizzy or orthostatic symptoms.  PMH, current medications, allergies, social history, and family history reviewed in epic.  PHYSICAL EXAM: Vitals:   07/25/23 1116  BP: 126/72  Pulse: 63  SpO2: 100%   GENERAL: Tall, thin, well-nourished, NAD HEENT: The mucous membranes are pink and moist.   PULM: There are no chest wall deformities. There is no chest wall tenderness. Normal work of breathing, clear to auscultation bilaterally. Respirations are unlabored.  CARDIAC:  JVP: Not elevated         Normal rate with regular rhythm.  Systolic murmur.  No lower extremity edema.  ABDOMEN: Soft, non-tender, non-distended. NEUROLOGIC: Patient is oriented x3 with no focal or lateralizing neurologic deficits.  PSYCH: Patients affect is appropriate, there is no evidence of anxiety or depression.  SKIN: Warm and dry; no lesions or wounds. Warm and well perfused extremities.  DATA REVIEW  ECG: 07/23/23: sinus with LVH and repol abnormalities, ST/TW abnormalities, HR 74      ECHO: 07/14/23: LVEF 25 to 30%, global hypokinesis, moderately reduced RV function, mildly enlarged, 26 mm TAVR valve within prior freestyle graft, no paravalvular leak.  CATH: 07/10/23: Normal coronary arteries, right dominant, severely elevated biventricular pressures with evidence of left-to-right shunt despite increase in oxygen saturation from SVC to PA.  CCTA: Prior thoracic aortic root graft with 27 mm Medtronic freestyle valve, partial anomalous pulmonary venous return and ASD with mild RV dilation.  Heart failure review: - Classification: Heart failure with reduced EF - Etiology: Valvular - NYHA Class: II - Volume status: Euvolemic - ACEi/ARB/ARNI: Currently up-titrating - Aldosterone antagonist: Currently up-titrating - Beta-blocker: Currently up-titrating - Digoxin: Not indicated - Hydralazine/Nitrates: Not indicated - SGLT2i: Maximally tolerated dose - GLP-1: Not a candidate - Advanced therapies: Not needed at this time - ICD: Currently uptitrating GDMT   ASSESSMENT & PLAN:  Chronic systolic heart failure: Previously normal ejection fraction, reduced ejection fraction noted after emergent valve in valve TAVR.  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  Patient hemodynamically stable, will continue to titrate GDMT before repeat.  -Increase losartan to 50 mg daily -Continue Jardiance 10 mg daily, spironolactone 12.5 mg daily, Toprol 25 mg daily -No need for diuretics -Repeat echo already ordered for December, but would not recommend ICD if persistently depressed  Severe aortic regurgitation s/p ViV TAVR: History of Marfan syndrome with prior Medtronic freestyle graft placement, complicated by leaflet failure and severe chronic aortic regurgitation leading to cardiogenic shock.  Patient stabilized on dobutamine and norepinephrine with successful TAVR.  Echocardiogram reviewed,  well-functioning valve. -Follows with structural -Continue apixaban 2.5 mg twice daily  Personal anomalous pulmonary venous return with ASD: Noted on CT scan, evidence of shunt on right heart cath although IVC sat was not obtained.  Will evaluate RV on upcoming echocardiogram and if significant dilation or dysfunction could consider further workup with full shunt run versus CMR. -Echocardiogram ordered for 12/6 -Consider repeat right heart cath now that valve has improved    Clearnce Hasten, MD Advanced [facility] Failure Mechanical Circulatory Support 07/28/23

## 2023-07-28 ENCOUNTER — Ambulatory Visit: Payer: Federal, State, Local not specified - PPO | Admitting: Cardiovascular Disease

## 2023-07-28 DIAGNOSIS — I5022 Chronic systolic (congestive) heart failure: Secondary | ICD-10-CM | POA: Insufficient documentation

## 2023-07-28 DIAGNOSIS — Q2111 Secundum atrial septal defect: Secondary | ICD-10-CM | POA: Insufficient documentation

## 2023-07-28 DIAGNOSIS — Q263 Partial anomalous pulmonary venous connection: Secondary | ICD-10-CM | POA: Insufficient documentation

## 2023-07-29 ENCOUNTER — Telehealth: Payer: Self-pay | Admitting: Physician Assistant

## 2023-07-29 NOTE — Telephone Encounter (Signed)
Patient wife called and stated patient nose started bleeding while he was driving.  Lasted for about 3-4 minutes. He states he did not have nose bleeds until he started Eliquis. Discussed ED and bleeding precautions. Will forward to provider.

## 2023-07-29 NOTE — Telephone Encounter (Signed)
Pt c/o medication issue:  1. Name of Medication: apixaban (ELIQUIS) 2.5 MG TABS tablet   2. How are you currently taking this medication (dosage and times per day)?  Take 1 tablet (2.5 mg total) by mouth 2 (two) times daily.       3. Are you having a reaction (difficulty breathing--STAT)? No  4. What is your medication issue? Pt's wife called stating pt is currently having a nose bleed after being on this medication. He called her while driving to let her know that it wouldn't stop bleeding. Pt arrived home while speak with her and it has now stopped bleeding but he is spitting up blood now. Please advise

## 2023-07-29 NOTE — Telephone Encounter (Signed)
Spoke with patient and he will try to keep nostrils moist. He will give [place] a call back if he continue to have nosebleeds.

## 2023-07-30 DIAGNOSIS — I5020 Unspecified systolic (congestive) heart failure: Secondary | ICD-10-CM | POA: Diagnosis not present

## 2023-07-31 LAB — BASIC METABOLIC PANEL
BUN/Creatinine Ratio: 24 — ABNORMAL HIGH (ref 9–20)
BUN: 24 mg/dL (ref 6–24)
CO2: 26 mmol/L (ref 20–29)
Calcium: 9.7 mg/dL (ref 8.7–10.2)
Chloride: 104 mmol/L (ref 96–106)
Creatinine, Ser: 1.01 mg/dL (ref 0.76–1.27)
Glucose: 79 mg/dL (ref 70–99)
Potassium: 4.8 mmol/L (ref 3.5–5.2)
Sodium: 141 mmol/L (ref 134–144)
eGFR: 90 mL/min/{1.73_m2} (ref >59)

## 2023-08-06 ENCOUNTER — Telehealth: Payer: Self-pay

## 2023-08-06 NOTE — Telephone Encounter (Signed)
Transition Care Management Unsuccessful Follow-up Telephone Call  Date of discharge and from where:  07/23/2023 [facility]  Attempts:  1st Attempt  Reason for unsuccessful TCM follow-up call:  No answer/busy  Christopher Ball [facility]  [facility] Resource Care Guide Direct Dial: [phone]  Website: [email or web address]

## 2023-08-07 ENCOUNTER — Telehealth (HOSPITAL_COMMUNITY): Payer: Self-pay

## 2023-08-07 ENCOUNTER — Telehealth: Payer: Self-pay

## 2023-08-07 NOTE — Telephone Encounter (Signed)
Transition Care Management Follow-up Telephone Call Date of discharge and from where: 07/23/2023 [facility] How have you been since you were released from the hospital? Patient stated he is feeling better. Any questions or concerns? No  Items Reviewed: Did the pt receive and understand the discharge instructions provided? Yes  Medications obtained and verified?  No medication prescribed this visit. Patient is able to afford medication. Other? No  Any new allergies since your discharge? No  Dietary orders reviewed? Yes Do you have support at home? Yes   Follow up appointments reviewed:  PCP Hospital f/u appt confirmed? No  Scheduled to see  on  @ . Specialist Hospital f/u appt confirmed? Yes  Scheduled to see Romie Minus, MD  on 07/25/2023 @ [facility]. Are transportation arrangements needed? No  If their condition worsens, is the pt aware to call PCP or go to the Emergency Dept.? Yes Was the patient provided with contact information for the PCP's office or ED? Yes Was to pt encouraged to call back with questions or concerns? Yes   Shykeria Sakamoto [facility]  [facility] Guide Direct Dial: [phone]  Website: [email or web address]

## 2023-08-07 NOTE — Telephone Encounter (Signed)
Pt is not interested in scheduling cardiac rehab.   Closed referral.

## 2023-08-11 ENCOUNTER — Ambulatory Visit: Payer: Federal, State, Local not specified - PPO | Admitting: Nurse Practitioner

## 2023-08-11 ENCOUNTER — Other Ambulatory Visit (HOSPITAL_COMMUNITY): Payer: Self-pay

## 2023-08-13 ENCOUNTER — Other Ambulatory Visit (HOSPITAL_COMMUNITY): Payer: Self-pay

## 2023-08-22 ENCOUNTER — Ambulatory Visit: Payer: Federal, State, Local not specified - PPO | Attending: Physician Assistant

## 2023-08-22 ENCOUNTER — Ambulatory Visit (INDEPENDENT_AMBULATORY_CARE_PROVIDER_SITE_OTHER): Payer: Federal, State, Local not specified - PPO | Admitting: Physician Assistant

## 2023-08-22 VITALS — BP 118/70 | HR 53 | Ht 79.0 in | Wt 151.0 lb

## 2023-08-22 DIAGNOSIS — I5022 Chronic systolic (congestive) heart failure: Secondary | ICD-10-CM

## 2023-08-22 DIAGNOSIS — N1831 Chronic kidney disease, stage 3a: Secondary | ICD-10-CM

## 2023-08-22 DIAGNOSIS — Q263 Partial anomalous pulmonary venous connection: Secondary | ICD-10-CM

## 2023-08-22 DIAGNOSIS — Z952 Presence of prosthetic heart valve: Secondary | ICD-10-CM

## 2023-08-22 LAB — ECHOCARDIOGRAM COMPLETE
AR max vel: 2.19 cm2
AV Area VTI: 2.22 cm2
AV Area mean vel: 1.97 cm2
AV Mean grad: 11.4 mm[Hg]
AV Peak grad: 20.7 mm[Hg]
Ao pk vel: 2.28 m/s
Area-P 1/2: 1.92 cm2
Calc EF: 40.9 %
S' Lateral: 3.7 cm
Single Plane A2C EF: 41.9 %
Single Plane A4C EF: 42.8 %

## 2023-08-22 NOTE — Patient Instructions (Signed)
Medication Instructions:  Stop Eliquis on Monday, January 27  After you stop Eliquis start Aspirin 81 mg daily   *If you need a refill on your cardiac medications before your next appointment, please call your pharmacy*   Lab Work: BMET- Today   If you have labs (blood work) drawn today and your tests are completely normal, you will receive your results only by: MyChart Message (if you have MyChart) OR A paper copy in the mail If you have any lab test that is abnormal or we need to change your treatment, we will call you to review the results.   Testing/Procedures: None ordered    Follow-Up: Follow up as scheduled   Other Instructions

## 2023-08-22 NOTE — Progress Notes (Signed)
[facility]   MULTIDISCIPLINARY HEART VALVE CLINIC                                     Cardiology Office Note:    Date:  08/22/2023   ID:  Christopher Ball, DOB [birth year], MRN [id number]  PCP:  Lorre Munroe, NP  [facility] Cardiologist:  Nanetta Batty, MD  / Dr. Lynnette Caffey and Dr. Laneta Simmers (TAVR)  [facility] Electrophysiologist:  None   Referring MD: Lorre Munroe, NP   1 month s/p TAVR  History of Present Illness:    Christopher Ball is a 52 y.o. 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  Christopher Ball underwent aortic root replacement with a Medtronic freestyle 27 mm aortic valve and ascending aortic replacement with a 24 mm Hemashield graft with coronary artery reimplantation in 2007 with Dr. [name]. He refused mechanical valve with obligate long term Coumadin at the time.    In August 2024 he developed abrupt chest pain/SOB and presented to the ED. He was found to have an elevated BNP >900 and mildly elevated hstrop. A CTA of the chest, abdomen, and pelvis was performed and showed no evidence of dissection.    Cardiac CT 06/02/23 showed CAC of 0, mild HALT of prosthetic valve and mild root dilation. There was also evidence of partial anomalous pulmonary venous return with evidence of an atrial septal defect and with associated right ventricular dilation.    Follow up echo 06/05/23 showed EF 55%, mod pulm HTN, S/p bioprosthetic AVR with known restricted R/L cusps with eccentric moderate to severe AI. PHT 198 ms and holodiastolic flow reversal. There was moderate aortic valve stenosis with 27 mmHg, peak grad 44.1 mmHg, AVA 1.1 cm2, DVI  0.39.    TEE 06/25/23 showed EF 60%, moderate pericardial effusion, severe aortic regurgitation with an eccentric AI jet and a prolapsed leaflet with PHT . There was moderate AS with a mean gradient of 32 mmhg and moderate PFO with predominantly L->R shunting. Elevated aortic gradients felt to be 2/2 AI vs true aortic stenosis. He underwent RHC with biventricular pressure elevation and severely reduced cardiac index by Fick despite left to right shunt. He was admitted for diuresis, responded poorly initially then developed shock requiring dobutamine and norepinephrine to maintain urine output. Lactate improved and patient underwent urgent VIV TAVR with a 26 mm Edwards S3UR via the TF approach using Sentinel Cerebral Embolic Protection on 07/13/23. He was started on Eliquis 2.5mg  BID x 3 months followed by aspirin 81mg  daily indefinitely. POD1 echo showed EF 25%, normally functioning TAVR with a mean gradient of 8 mmHg and no PVL. LV dysfunction felt to be 2/2 stress induced CM and he was started on GDMT.  Today the patient presents to clinic for follow up. Here with his wife. No CP or SOB. No LE edema, orthopnea or PND. No dizziness or syncope. No blood in stool or urine. No palpitations.    Past Medical History:  Diagnosis Date   Marfan syndrome    S/P VIV TAVR (transcatheter aortic valve replacement) 07/13/2023  s/p VIV TAVR with a 26 mm Edwards S3UR via the TF approach by Dr. Lynnette Caffey & Dr. Laneta Simmers   Stomach problems    Thoracic aortic aneurysm [facility])    replacement by Dr. [name] /30/07 with aortic bioprosthetic valve   Tobacco abuse     Past Surgical History:  Procedure Laterality Date   AORTIC VALVE REPLACEMENT     COLONOSCOPY WITH PROPOFOL N/A 08/19/2022   Procedure: COLONOSCOPY WITH PROPOFOL;  Surgeon: Christopher Mood, MD;  Location: [facility] ENDOSCOPY;  Service: Gastroenterology;  Laterality: N/A;   RIGHT/LEFT HEART CATH AND CORONARY ANGIOGRAPHY N/A 07/10/2023   Procedure: RIGHT/LEFT  HEART CATH AND CORONARY ANGIOGRAPHY;  Surgeon: Tonny Bollman, MD;  Location: [facility] INVASIVE CV LAB;  Service: Cardiovascular;  Laterality: N/A;   TEE WITHOUT CARDIOVERSION N/A 06/25/2023   Procedure: TRANSESOPHAGEAL ECHOCARDIOGRAM;  Surgeon: Chilton Si, MD;  Location: [facility] INVASIVE CV LAB;  Service: Cardiovascular;  Laterality: N/A;   VASECTOMY  09/16/2002    Current Medications: Current Meds  Medication Sig   apixaban (ELIQUIS) 2.5 MG TABS tablet Take 1 tablet (2.5 mg total) by mouth 2 (two) times daily. (Patient taking differently: Take 2.5 mg by mouth 2 (two) times daily. Stop on Monday, January 27)   clindamycin (CLEOCIN) 300 MG capsule Take 2 tablets 1 hour prior to dental appointment   empagliflozin (JARDIANCE) 10 MG TABS tablet Take 1 tablet (10 mg total) by mouth daily.   fluticasone (FLONASE) 50 MCG/ACT nasal spray Place 1 spray into both nostrils daily as needed for rhinitis.   levocetirizine (XYZAL) 5 MG tablet Take 5 mg by mouth every evening.   losartan (COZAAR) 50 MG tablet Take 1 tablet (50 mg total) by mouth daily.   metoprolol succinate (TOPROL-XL) 25 MG 24 hr tablet Take 1 tablet (25 mg total) by mouth at bedtime.   Multiple Vitamins-Minerals (MULTIVITAMIN WITH MINERALS) tablet Take 1 tablet by mouth daily.   OVER THE COUNTER MEDICATION Take 1 tablet by mouth daily. Medication: Prevacid  Extra Strength taking daily   spironolactone (ALDACTONE) 25 MG tablet Take 0.5 tablets (12.5 mg total) by mouth daily.   tadalafil (CIALIS) 20 MG tablet Take 1 tablet (20 mg total) by mouth daily.   VITAMIN E PO Take by mouth.      ROS:   Please see the history of present illness.    All other systems reviewed and are negative.  EKGs   EKG:  EKG is NOT ordered today.    Recent Labs: 07/10/2023: B Natriuretic Peptide >4,500.0 07/23/2023: ALT 176; Hemoglobin 12.4; Magnesium 2.3; Platelets 294 07/30/2023: BUN 24; Creatinine, Ser 1.01; Potassium 4.8; Sodium 141  Recent Lipid  Panel    Component Value Date/Time   CHOL 150 01/16/2023 0926   CHOL 166 08/24/2019 0942   TRIG 68 01/16/2023 0926   HDL 49 01/16/2023 0926   HDL 50 08/24/2019 0942   CHOLHDL 3.1 01/16/2023 0926   LDLCALC 86 01/16/2023 0926     Risk Assessment/Calculations:           Physical Exam:    VS:  BP 118/70   Pulse (!) 53   Ht 6\' 7"  (2.007 m)   Wt 151 lb (68.5 kg)   SpO2 99%   BMI 17.01 kg/m     Wt Readings from Last 3 Encounters:  08/22/23 151 lb (68.5 kg)  07/25/23 151 lb 12.8 oz (68.9 kg)  07/23/23 151 lb 14.4 oz (68.9 kg)     GEN: tall thin, white male NECK: No  JVD; No carotid bruits CARDIAC: RRR, soft flow murmur @ RUSB. No rubs, gallops RESPIRATORY:  Clear to auscultation without rales, wheezing or rhonchi  ABDOMEN: Soft, non-tender, non-distended EXTREMITIES:  No edema; No deformity   ASSESSMENT:    1. S/P VIV TAVR (transcatheter aortic valve replacement)   2. Chronic systolic heart failure (HCC)   3. Stage 3a chronic kidney disease (HCC)   4. Partial anomalous pulmonary venous return (PAPVR)     PLAN:    In order of problems listed above:  Bioprosthetic severe AI s/p VIV TAVR: echo today shows EF 40-45%, normally functioning TAVR with a mean gradient of 13 mm hg and no PVL. He has NYHA class I symptoms. Continue Eliquis 2.5mg  BID x 3 months (stop on 10/12/22) followed by aspirin 81mg  daily indefinitely. He has clindamycin for SBE prophylaxis. I will see him back for 1 year follow up and echo. He has a recall for February 2025 to see Dr. Elwyn Lade.   Chronic HFrEF: EF improved to 40-45%. No s/s CHF. Continue on losartan 50mg  daily, Toprol XL 25mg  at bedtime, Jardiance 10mg  daily and spiro 12.5mg  daily today.  BMET today given recent increase in losartan.   CKD stage IIIa: creat improved to 1.01 on most recent labs. Recheck BMET today.   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 Will evaluate RV on upcoming echocardiogram and if significant dilation or dysfunction could consider further workup with full shunt run versus CMR. Followed by Dr. Elwyn Lade    Medication Adjustments/Labs and Tests Ordered: Current medicines are reviewed at length with the patient today.  Concerns regarding medicines are outlined above.  Orders Placed This Encounter  Procedures   Basic metabolic panel   ECHOCARDIOGRAM COMPLETE   No orders of the defined types were placed in this encounter.   Patient Instructions  Medication Instructions:  Stop Eliquis on Monday, January 27  After you stop Eliquis start Aspirin 81 mg daily   *If you need a refill on your cardiac medications before your next appointment, please call your pharmacy*   Lab Work: BMET- Today   If you have labs (blood work) drawn today and your tests are completely normal, you will receive your results only by: MyChart Message (if you have MyChart) OR A paper copy in the mail If you have any lab test that is abnormal or we need to change your treatment, we will call you to review the results.   Testing/Procedures: None ordered    Follow-Up: Follow up as scheduled   Other Instructions     Signed, Cline Crock, PA-C  08/22/2023 12:08 PM    [facility]

## 2023-08-23 LAB — BASIC METABOLIC PANEL
BUN/Creatinine Ratio: 19 (ref 9–20)
BUN: 20 mg/dL (ref 6–24)
CO2: 25 mmol/L (ref 20–29)
Calcium: 9.3 mg/dL (ref 8.7–10.2)
Chloride: 107 mmol/L — ABNORMAL HIGH (ref 96–106)
Creatinine, Ser: 1.03 mg/dL (ref 0.76–1.27)
Glucose: 86 mg/dL (ref 70–99)
Potassium: 5.2 mmol/L (ref 3.5–5.2)
Sodium: 141 mmol/L (ref 134–144)
eGFR: 88 mL/min/{1.73_m2} (ref >59)

## 2023-08-25 ENCOUNTER — Other Ambulatory Visit: Payer: Self-pay | Admitting: Physician Assistant

## 2023-08-25 DIAGNOSIS — N1831 Chronic kidney disease, stage 3a: Secondary | ICD-10-CM

## 2023-09-01 ENCOUNTER — Other Ambulatory Visit: Payer: Self-pay

## 2023-09-01 ENCOUNTER — Other Ambulatory Visit (HOSPITAL_COMMUNITY): Payer: Self-pay

## 2023-09-01 ENCOUNTER — Other Ambulatory Visit: Payer: Self-pay | Admitting: Physician Assistant

## 2023-09-01 MED ORDER — APIXABAN 2.5 MG PO TABS
2.5000 mg | ORAL_TABLET | Freq: Two times a day (BID) | ORAL | 0 refills | Status: DC
  Filled 2023-09-01: qty 60, 30d supply, fill #0
  Filled 2023-09-28: qty 60, 30d supply, fill #1

## 2023-09-01 NOTE — Telephone Encounter (Signed)
Prescription refill request for Eliquis received. Indication:TAVR Last office visit:12/24 Scr:1.03  12/24 Age: 52 Weight:68.5  KG  PRESCRIPTION REFILLED

## 2023-09-04 LAB — BASIC METABOLIC PANEL
BUN/Creatinine Ratio: 17 (ref 9–20)
BUN: 19 mg/dL (ref 6–24)
CO2: 23 mmol/L (ref 20–29)
Calcium: 9.7 mg/dL (ref 8.7–10.2)
Chloride: 107 mmol/L — ABNORMAL HIGH (ref 96–106)
Creatinine, Ser: 1.11 mg/dL (ref 0.76–1.27)
Glucose: 75 mg/dL (ref 70–99)
Potassium: 4.8 mmol/L (ref 3.5–5.2)
Sodium: 143 mmol/L (ref 134–144)
eGFR: 80 mL/min/{1.73_m2} (ref >59)

## 2023-09-09 ENCOUNTER — Other Ambulatory Visit: Payer: Self-pay

## 2023-09-11 ENCOUNTER — Other Ambulatory Visit: Payer: Self-pay | Admitting: Acute Care

## 2023-09-11 DIAGNOSIS — Z122 Encounter for screening for malignant neoplasm of respiratory organs: Secondary | ICD-10-CM

## 2023-09-11 DIAGNOSIS — Z87891 Personal history of nicotine dependence: Secondary | ICD-10-CM

## 2023-09-11 DIAGNOSIS — F17210 Nicotine dependence, cigarettes, uncomplicated: Secondary | ICD-10-CM

## 2023-09-29 ENCOUNTER — Other Ambulatory Visit (HOSPITAL_COMMUNITY): Payer: Self-pay

## 2023-09-29 ENCOUNTER — Other Ambulatory Visit: Payer: Self-pay | Admitting: Physician Assistant

## 2023-09-29 MED ORDER — APIXABAN 2.5 MG PO TABS
2.5000 mg | ORAL_TABLET | Freq: Two times a day (BID) | ORAL | 0 refills | Status: DC
  Filled 2023-09-29: qty 60, 30d supply, fill #0

## 2023-09-29 NOTE — Telephone Encounter (Signed)
.  Prescription refill request for Eliquis  received. Indication: AVR Last office visit: 08/22/23  MARLA Hummer PA-C Scr: 1.11 on 09/04/23  Epic Age: 53 Weight: 68.5kg  Based on hospital records and diagnosis pt was placed on Eliquis  2.5mg  twice daily x 3 months then change to ASA 81mg   (10/13/23) Refill approved x 1.

## 2023-10-16 ENCOUNTER — Other Ambulatory Visit (HOSPITAL_COMMUNITY): Payer: Self-pay

## 2023-11-11 ENCOUNTER — Ambulatory Visit
Admission: RE | Admit: 2023-11-11 | Discharge: 2023-11-11 | Disposition: A | Discharge status: Home / Self Care | Payer: Federal, State, Local not specified - PPO | Source: Ambulatory Visit | Attending: Acute Care | Admitting: Acute Care

## 2023-11-11 DIAGNOSIS — F17210 Nicotine dependence, cigarettes, uncomplicated: Secondary | ICD-10-CM | POA: Diagnosis present

## 2023-11-11 DIAGNOSIS — Z87891 Personal history of nicotine dependence: Secondary | ICD-10-CM | POA: Diagnosis present

## 2023-11-11 DIAGNOSIS — Z122 Encounter for screening for malignant neoplasm of respiratory organs: Secondary | ICD-10-CM | POA: Diagnosis present

## 2023-11-17 ENCOUNTER — Other Ambulatory Visit (HOSPITAL_COMMUNITY): Payer: Self-pay

## 2023-12-08 ENCOUNTER — Other Ambulatory Visit: Payer: Self-pay | Admitting: Acute Care

## 2023-12-08 DIAGNOSIS — Z87891 Personal history of nicotine dependence: Secondary | ICD-10-CM

## 2023-12-08 DIAGNOSIS — Z122 Encounter for screening for malignant neoplasm of respiratory organs: Secondary | ICD-10-CM

## 2023-12-22 ENCOUNTER — Ambulatory Visit (HOSPITAL_COMMUNITY)
Admission: RE | Admit: 2023-12-22 | Discharge: 2023-12-22 | Disposition: A | Discharge status: Home / Self Care | Source: Ambulatory Visit | Attending: Cardiology | Admitting: Cardiology

## 2023-12-22 ENCOUNTER — Other Ambulatory Visit (HOSPITAL_COMMUNITY): Payer: Self-pay

## 2023-12-22 ENCOUNTER — Encounter (HOSPITAL_COMMUNITY): Payer: Self-pay | Admitting: Cardiology

## 2023-12-22 VITALS — BP 100/60 | HR 59 | Wt 159.8 lb

## 2023-12-22 DIAGNOSIS — Z7984 Long term (current) use of oral hypoglycemic drugs: Secondary | ICD-10-CM | POA: Diagnosis not present

## 2023-12-22 DIAGNOSIS — I110 Hypertensive heart disease with heart failure: Secondary | ICD-10-CM | POA: Diagnosis not present

## 2023-12-22 DIAGNOSIS — I5022 Chronic systolic (congestive) heart failure: Secondary | ICD-10-CM | POA: Insufficient documentation

## 2023-12-22 DIAGNOSIS — Z79899 Other long term (current) drug therapy: Secondary | ICD-10-CM | POA: Diagnosis not present

## 2023-12-22 DIAGNOSIS — Q211 Atrial septal defect, unspecified: Secondary | ICD-10-CM | POA: Diagnosis not present

## 2023-12-22 DIAGNOSIS — R0602 Shortness of breath: Secondary | ICD-10-CM | POA: Insufficient documentation

## 2023-12-22 DIAGNOSIS — I272 Pulmonary hypertension, unspecified: Secondary | ICD-10-CM | POA: Insufficient documentation

## 2023-12-22 DIAGNOSIS — Z953 Presence of xenogenic heart valve: Secondary | ICD-10-CM | POA: Diagnosis not present

## 2023-12-22 DIAGNOSIS — Z7982 Long term (current) use of aspirin: Secondary | ICD-10-CM | POA: Diagnosis not present

## 2023-12-22 MED ORDER — EMPAGLIFLOZIN 10 MG PO TABS
10.0000 mg | ORAL_TABLET | Freq: Every day | ORAL | 6 refills | Status: DC
  Filled 2023-12-22 – 2024-05-15 (×3): qty 30, 30d supply, fill #0
  Filled 2024-06-16: qty 30, 30d supply, fill #1
  Filled 2024-07-17 – 2024-07-19 (×2): qty 30, 30d supply, fill #2
  Filled 2024-08-17: qty 30, 30d supply, fill #3

## 2023-12-22 NOTE — Progress Notes (Incomplete)
 ADVANCED HEART FAILURE FOLLOW UP CLINIC NOTE  Referring Physician: Lorre Munroe, NP  Primary Care: Lorre Munroe, NP Primary Cardiologist:  HPI: Christopher Ball is a 53 y.o. male who presents for follow up of HFrEF, aortic valve disease, pulmonary hypertension.      Christopher Ball underwent aortic root replacement with a Medtronic freestyle 27 mm aortic valve and ascending aortic replacement with a 24 mm Hemashield graft with coronary artery reimplantation in 2007 with Dr. [name]. He refused mechanical valve with obligate long term Coumadin at the time.    He was seen in cardiology follow up and complained of progressive symptoms. Cardiac CT 06/02/23 showed CAC of 0, mild HALT of prosthetic valve and mild root dilation. There was also evidence of partial anomalous pulmonary venous return with evidence of an atrial septal defect and with associated right ventricular dilation.    Follow up echo 06/05/23 showed EF 55%, mod pulm HTN, S/p bioprosthetic AVR with known restricted R/L cusps with eccentric moderate to severe AI. PHT 198 ms and holodiastolic flow reversal. There was moderate aortic valve stenosis with 27 mmHg, peak grad 44.1 mmHg, AVA 1.1 cm2, DVI 0.39.    TEE 06/25/23 showed EF 60%, moderate pericardial effusion, severe aortic regurgitation with an eccentric AI jet and a prolapsed leaflet with PHT . There was moderate AS with a mean gradient of 32 mmhg and moderate PFO with predominantly L->R shunting. Elevated aortic gradients felt to be 2/2 AI vs true aortic stenosis. He underwent RHC with biventricular pressure elevation and severely reduced cardiac index by Fick despite left to right shunt. He was admitted for diuresis, responded poorly initially then developed shock requiring dobutamine and norepinephrine to maintain urine output. Lactate improved and patient underwent ViV emergent TAVR on 10/27 with a 26mm Edwards valve. Improved following, though EF had a significant drop  following the procedure.      SUBJECTIVE:  PMH, current medications, allergies, social history, and family history reviewed in epic.  PHYSICAL EXAM: Vitals:   12/22/23 0924  BP: 100/60  Pulse: (!) 59  SpO2: 97%   GENERAL: Well nourished and in no apparent distress at rest.  PULM:  Normal work of breathing, clear to auscultation bilaterally. Respirations are unlabored.  CARDIAC:  JVP: ***         Normal rate with regular rhythm. No murmurs, rubs or gallops.  *** edema. Warm and well perfused extremities. ABDOMEN: Soft, non-tender, non-distended. NEUROLOGIC: Patient is oriented x3 with no focal or lateralizing neurologic deficits.    DATA REVIEW  ECG: 07/23/23: sinus with LVH and repol abnormalities, ST/TW abnormalities, HR 74      ECHO: 07/14/23: LVEF 25 to 30%, global hypokinesis, moderately reduced RV function, mildly enlarged, 26 mm TAVR valve within prior freestyle graft, no paravalvular leak.   CATH: 07/10/23: Normal coronary arteries, right dominant, severely elevated biventricular pressures with evidence of left-to-right shunt despite increase in oxygen saturation from SVC to PA.   CCTA: Prior thoracic aortic root graft with 27 mm Medtronic freestyle valve, partial anomalous pulmonary venous return and ASD with mild RV dilation.  Heart failure review: - Classification: {HFCLASS:30917} - Etiology: {Cardiomyopathy:30918} - NYHA Class:  - Volume status: {volumechf:30919} - ACEi/ARB/ARNI: {HF:30752} - Aldosterone antagonist: {HF:30752} - Beta-blocker: {HF:30752} - Digoxin: {HF:30752} - Hydralazine/Nitrates: {HF:30752} - SGLT2i: {HF:30752} - GLP-1: {GLP:30906} - Advanced therapies: {Advancedtherapies:30916} - ICD: {ICD:30901}  ASSESSMENT & PLAN:  Chronic systolic heart failure: Previously normal ejection fraction, reduced ejection fraction noted after emergent valve  in valve TAVR.  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  Patient hemodynamically stable, will continue to titrate GDMT before repeat.  -Increase losartan to 50 mg daily -Continue Jardiance 10 mg daily, spironolactone 12.5 mg daily, Toprol 25 mg daily -No need for diuretics -Repeat echo already ordered for December, but would not recommend ICD if persistently depressed   Severe aortic regurgitation s/p ViV TAVR: History of Marfan syndrome with prior Medtronic freestyle graft placement, complicated by leaflet failure and severe chronic aortic regurgitation leading to cardiogenic shock.  Patient stabilized on dobutamine and norepinephrine with successful TAVR.  Echocardiogram reviewed, well-functioning valve. 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  Will evaluate RV on upcoming echocardiogram and if significant dilation or dysfunction could consider further workup with full shunt run versus CMR. -Echocardiogram ordered for 12/6 -Consider repeat right heart cath now that valve has improved  Follow up in ***  Clearnce Hasten, MD Advanced Heart Failure Mechanical Circulatory Support 12/22/23

## 2023-12-22 NOTE — Patient Instructions (Signed)
 There has been no changes to your medications.  Your physician recommends that you schedule a follow-up appointment in: 4 months (August ) ** PLEASE CALL THE OFFICE IN JUNE TO ARRANGE YOUR FOLLOW UP APPOINTMENT.**  If you have any questions or concerns before your next appointment please send [place] a message through [employer] or call our office at [phone].    TO LEAVE A MESSAGE FOR THE NURSE SELECT OPTION 2, PLEASE LEAVE A MESSAGE INCLUDING: YOUR NAME DATE OF BIRTH CALL BACK NUMBER REASON FOR CALL**this is important as we prioritize the call backs  YOU WILL RECEIVE A CALL BACK THE SAME DAY AS LONG AS YOU CALL BEFORE 4:00 PM  At the [facility], you and your health needs are our priority. As part of our continuing mission to provide you with exceptional heart care, we have created designated Provider Care Teams. These Care Teams include your primary Cardiologist (physician) and Advanced Practice Providers (APPs- Physician Assistants and Nurse Practitioners) who all work together to provide you with the care you need, when you need it.   You may see any of the following providers on your designated Care Team at your next follow up: Dr Arvilla Meres Dr Marca Ancona Dr. Dorthula Nettles Dr. Clearnce Hasten Amy Filbert Schilder, NP Robbie Lis, [place] [facility] [place], [place] Brynda Peon, NP [place] Lee, NP Clarisa Kindred, NP Karle Plumber, PharmD Enos Fling, PharmD   Please be sure to bring in all your medications bottles to every appointment.    Thank you for choosing [facility]

## 2024-01-12 ENCOUNTER — Other Ambulatory Visit (HOSPITAL_COMMUNITY): Payer: Self-pay

## 2024-01-22 ENCOUNTER — Other Ambulatory Visit (HOSPITAL_COMMUNITY): Payer: Self-pay

## 2024-02-02 ENCOUNTER — Encounter: Payer: Self-pay | Admitting: Internal Medicine

## 2024-02-02 ENCOUNTER — Ambulatory Visit: Payer: Self-pay | Admitting: Internal Medicine

## 2024-02-02 VITALS — BP 122/74 | Ht 79.0 in | Wt 166.8 lb

## 2024-02-02 DIAGNOSIS — I700 Atherosclerosis of aorta: Secondary | ICD-10-CM | POA: Diagnosis not present

## 2024-02-02 DIAGNOSIS — J432 Centrilobular emphysema: Secondary | ICD-10-CM | POA: Diagnosis not present

## 2024-02-02 DIAGNOSIS — Q87410 Marfan's syndrome with aortic dilation: Secondary | ICD-10-CM

## 2024-02-02 DIAGNOSIS — I5022 Chronic systolic (congestive) heart failure: Secondary | ICD-10-CM

## 2024-02-02 DIAGNOSIS — E782 Mixed hyperlipidemia: Secondary | ICD-10-CM

## 2024-02-02 DIAGNOSIS — R636 Underweight: Secondary | ICD-10-CM

## 2024-02-02 DIAGNOSIS — I719 Aortic aneurysm of unspecified site, without rupture: Secondary | ICD-10-CM

## 2024-02-02 DIAGNOSIS — N486 Induration penis plastica: Secondary | ICD-10-CM

## 2024-02-02 NOTE — Assessment & Plan Note (Signed)
Encourage high-protein high calorie diet

## 2024-02-02 NOTE — Assessment & Plan Note (Signed)
Not currently being treated with medication

## 2024-02-02 NOTE — Progress Notes (Signed)
 Subjective:    Patient ID: Christopher Ball, male    DOB: [birth year], 53 y.o.   MRN: [id number]  HPI  Patient presents to clinic today for 54-month follow-up of chronic conditions.  Marfan syndrome: He denies any issues at this time other than generalized pain.  He is not following with genetics.  HLD with aortic atherosclerosis: His last LDL was 86 triglycerides 68, 01/2023.  He denies myalgias on atorvastatin  and aspirin .  He does not consume low-fat diet.  Emphysema: He denies chronic cough but does have shortness of breath with exertion.  He is not using any inhalers at this time.  He no longer smokes.  There are no PFTs on file.  Peyronie's disease: He takes cialis  as prescribed.  He follows with urology.  CHF: He denies chronic cough, lower extremity manage but does intermittently have shortness of breath.  He is taking jardiance , losartan , metoprolol  and spironolactone  as prescribed.  Echo from 08/2023 reviewed.  He follows with cardiology.  Aortic aneurysm: Without rupture.  CT of the chest from 04/2023 reviewed.  He follows with cardiology.  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 Lorie Rook & Dr. Sherene Dilling   Stomach problems    Thoracic aortic aneurysm [facility])    replacement by Dr. Carlena Chatters /30/07 with aortic bioprosthetic valve   Tobacco abuse     Current Outpatient Medications  Medication Sig Dispense Refill   aspirin  81 MG chewable tablet Chew 81 mg by mouth daily.     clindamycin  (CLEOCIN ) 300 MG capsule Take 2 tablets 1 hour prior to dental appointment 2 capsule 3   empagliflozin  (JARDIANCE ) 10 MG TABS tablet Take 1 tablet (10 mg total) by mouth daily. 30 tablet 6   fluticasone  (FLONASE ) 50 MCG/ACT nasal spray Place 1 spray into both nostrils daily as needed for rhinitis.     levocetirizine  (XYZAL ) 5 MG tablet Take 5 mg by mouth every evening.     losartan  (COZAAR ) 50 MG tablet Take 1 tablet (50 mg total) by mouth daily. 90 tablet 3   metoprolol  succinate (TOPROL -XL) 25 MG 24 hr tablet Take 1 tablet (25 mg total) by mouth at bedtime. 30 tablet 6   Multiple Vitamins-Minerals (MULTIVITAMIN WITH MINERALS) tablet Take 1 tablet by mouth daily.     OVER THE COUNTER MEDICATION Take 1 tablet by mouth daily. Medication: Prevacid  Extra Strength taking daily     spironolactone  (ALDACTONE ) 25 MG tablet Take 0.5 tablets (12.5 mg total) by mouth daily. 45 tablet 3   tadalafil  (CIALIS ) 20 MG tablet Take 1 tablet (20 mg total) by mouth daily. 10 tablet 1   VITAMIN E PO Take by mouth.     No current facility-administered medications for this visit.    Allergies  Allergen Reactions   Augmentin [Amoxicillin-Pot Clavulanate] Shortness Of Breath and Nausea And Vomiting    Family History  Problem Relation Age of Onset   Asthma Mother    Early death Father    Heart disease Father    Asthma Brother    Diabetes Maternal Grandmother    Cancer Neg Hx    Stroke Neg Hx    Kidney disease Neg Hx    Hypertension Neg Hx    Hyperlipidemia Neg Hx     Social  History   Socioeconomic History   Marital status: Married    Spouse name: Not on file   Number of children: Not on file   Years of education: Not on file   Highest education level: Not on file  Occupational History   Not on file  Tobacco Use   Smoking status: Former    Average packs/day: 1 pack/day for 31.0 years (31.0 ttl pk-yrs)    Types: Cigars, Cigarettes    Start date: 07/09/1992   Smokeless tobacco: Never  Vaping Use   Vaping status: Every Day  Substance and Sexual Activity   Alcohol use: Yes    Alcohol/week: 1.0 standard drink of alcohol    Types: 1 Shots of liquor per week    Comment: rare   Drug use: Yes    Types: Marijuana   Sexual activity: Yes  Other Topics Concern   Not on file  Social History Narrative   Not  on file   Social Drivers of Health   Financial Resource Strain: Not on file  Food Insecurity: No Food Insecurity (07/11/2023)   Hunger Vital Sign    Worried About Running Out of Food in the Last Year: Never true    Ran Out of Food in the Last Year: Never true  Transportation Needs: No Transportation Needs (07/11/2023)   PRAPARE - Administrator, Civil Service (Medical): No    Lack of Transportation (Non-Medical): No  Physical Activity: Not on file  Stress: Not on file  Social Connections: Not on file  Intimate Partner Violence: Not At Risk (07/11/2023)   Humiliation, Afraid, Rape, and Kick questionnaire    Fear of Current or Ex-Partner: No    Emotionally Abused: No    Physically Abused: No    Sexually Abused: No     Constitutional: Denies fever, malaise, fatigue, headache or abrupt weight changes.  HEENT: Denies eye pain, eye redness, ear pain, ringing in the ears, wax buildup, runny nose, nasal congestion, bloody nose, or sore throat. Respiratory: Patient reports shortness of breath with exertion.  Denies difficulty breathing, cough or sputum production.   Cardiovascular: Denies chest pain, chest tightness, palpitations or swelling in the hands or feet.  Gastrointestinal: Denies abdominal pain, bloating, constipation, diarrhea or blood in the stool.  GU: Patient reports erectile dysfunction.  Denies urgency, frequency, pain with urination, burning sensation, blood in urine, odor or discharge. Musculoskeletal: Patient reports multiple joint pain.  Denies decrease in range of motion, difficulty with gait, muscle pain or joint swelling.  Skin: Denies redness, rashes, lesions or ulcercations.  Neurological: Denies dizziness, difficulty with memory, difficulty with speech or problems with balance and coordination.  Psych: Denies anxiety, depression, SI/HI.  No other specific complaints in a complete review of systems (except as listed in HPI above).  Objective:    Physical Exam BP 122/74 (BP Location: Right Arm, Patient Position: Sitting, Cuff Size: Normal)   Ht 6\' 7"  (2.007 m)   Wt 166 lb 12.8 oz (75.7 kg)   BMI 18.79 kg/m    Wt Readings from Last 3 Encounters:  12/22/23 159 lb 12.8 oz (72.5 kg)  08/22/23 151 lb (68.5 kg)  07/25/23 151 lb 12.8 oz (68.9 kg)    General: Appears his stated age, underweight, in NAD. Skin: Warm, dry and intact.  HEENT: Head: normal shape and size; Eyes: sclera white, no icterus, conjunctiva pink, PERRLA and EOMs intact;  Cardiovascular: Normal rate and rhythm. S1,S2 noted.  Murmur noted. No JVD or BLE edema.  No carotid bruits noted. Pulmonary/Chest: Normal effort and diminished breath sounds. No respiratory distress. No wheezes, rales or ronchi noted.  Musculoskeletal: No signs of joint swelling. No difficulty with gait.  Neurological: Alert and oriented. Coordination normal.  Psychiatric: Mood and affect normal. Behavior is normal. Judgment and thought content normal.     BMET    Component Value Date/Time   NA 143 09/04/2023 0914   K 4.8 09/04/2023 0914   CL 107 (H) 09/04/2023 0914   CO2 23 09/04/2023 0914   GLUCOSE 75 09/04/2023 0914   GLUCOSE 110 (H) 07/23/2023 2131   BUN 19 09/04/2023 0914   CREATININE 1.11 09/04/2023 0914   CREATININE 1.16 01/16/2023 0926   CALCIUM  9.7 09/04/2023 0914   GFRNONAA >60 07/23/2023 2131   GFRAA 78 06/23/2017 0917    Lipid Panel     Component Value Date/Time   CHOL 150 01/16/2023 0926   CHOL 166 08/24/2019 0942   TRIG 68 01/16/2023 0926   HDL 49 01/16/2023 0926   HDL 50 08/24/2019 0942   CHOLHDL 3.1 01/16/2023 0926   LDLCALC 86 01/16/2023 0926    CBC    Component Value Date/Time   WBC 9.8 07/23/2023 2131   RBC 3.87 (L) 07/23/2023 2131   HGB 12.4 (L) 07/23/2023 2131   HGB 12.6 (L) 07/23/2023 1417   HCT 38.8 (L) 07/23/2023 2131   HCT 39.1 07/23/2023 1417   PLT 294 07/23/2023 2131   PLT 289 07/23/2023 1417   MCV 100.3 (H) 07/23/2023 2131   MCV 100 (H)  07/23/2023 1417   MCH 32.0 07/23/2023 2131   MCHC 32.0 07/23/2023 2131   RDW 13.9 07/23/2023 2131   RDW 11.8 07/23/2023 1417   LYMPHSABS 2.3 01/01/2014 2000   MONOABS 0.6 01/01/2014 2000   EOSABS 0.4 01/01/2014 2000   BASOSABS 0.1 01/01/2014 2000    Hgb A1C Lab Results  Component Value Date   HGBA1C 5.6 07/13/2023            Assessment & Plan:    RTC in 6 months for your annual exam Helayne Lo, NP

## 2024-02-02 NOTE — Assessment & Plan Note (Signed)
 Following with cardiology.

## 2024-02-02 NOTE — Assessment & Plan Note (Addendum)
 He does not want to start inhalers at this time He declines Prevnar 20 today

## 2024-02-02 NOTE — Assessment & Plan Note (Signed)
 Compensated Continue losartan , metoprolol  and spironolactone  He will continue to follow with cardiology

## 2024-02-02 NOTE — Patient Instructions (Signed)

## 2024-02-02 NOTE — Assessment & Plan Note (Signed)
 Lipid profile reviewed Encouraged to consume low-fat diet Continue atorvastatin  and aspirin 

## 2024-02-02 NOTE — Assessment & Plan Note (Signed)
 Lipid profile reviewed Encouraged him to consume low-fat diet Continue atorvastatin 

## 2024-02-02 NOTE — Assessment & Plan Note (Signed)
 Continue cialis  per urology

## 2024-02-15 ENCOUNTER — Other Ambulatory Visit: Payer: Self-pay | Admitting: Physician Assistant

## 2024-02-18 ENCOUNTER — Other Ambulatory Visit (HOSPITAL_COMMUNITY): Payer: Self-pay

## 2024-02-18 MED ORDER — METOPROLOL SUCCINATE ER 25 MG PO TB24
25.0000 mg | ORAL_TABLET | Freq: Every day | ORAL | 3 refills | Status: DC
  Filled 2024-02-18 – 2024-03-02 (×2): qty 30, 30d supply, fill #0
  Filled 2024-03-29: qty 30, 30d supply, fill #1
  Filled 2024-05-01: qty 30, 30d supply, fill #2
  Filled 2024-05-31: qty 30, 30d supply, fill #3

## 2024-03-01 ENCOUNTER — Other Ambulatory Visit (HOSPITAL_COMMUNITY): Payer: Self-pay

## 2024-03-02 ENCOUNTER — Other Ambulatory Visit (HOSPITAL_COMMUNITY): Payer: Self-pay

## 2024-03-29 ENCOUNTER — Other Ambulatory Visit (HOSPITAL_COMMUNITY): Payer: Self-pay

## 2024-05-14 ENCOUNTER — Encounter (HOSPITAL_COMMUNITY): Payer: Self-pay | Admitting: Cardiology

## 2024-05-14 ENCOUNTER — Ambulatory Visit (HOSPITAL_COMMUNITY)
Admission: RE | Admit: 2024-05-14 | Discharge: 2024-05-14 | Disposition: A | Discharge status: Home / Self Care | Source: Ambulatory Visit | Attending: Cardiology | Admitting: Cardiology

## 2024-05-14 VITALS — BP 130/82 | HR 56 | Ht 79.0 in | Wt 165.2 lb

## 2024-05-14 DIAGNOSIS — Q8740 Marfan's syndrome, unspecified: Secondary | ICD-10-CM | POA: Diagnosis not present

## 2024-05-14 DIAGNOSIS — Z79899 Other long term (current) drug therapy: Secondary | ICD-10-CM | POA: Insufficient documentation

## 2024-05-14 DIAGNOSIS — Q263 Partial anomalous pulmonary venous connection: Secondary | ICD-10-CM | POA: Diagnosis not present

## 2024-05-14 DIAGNOSIS — I272 Pulmonary hypertension, unspecified: Secondary | ICD-10-CM | POA: Diagnosis not present

## 2024-05-14 DIAGNOSIS — N1831 Chronic kidney disease, stage 3a: Secondary | ICD-10-CM | POA: Diagnosis not present

## 2024-05-14 DIAGNOSIS — Z7982 Long term (current) use of aspirin: Secondary | ICD-10-CM | POA: Diagnosis not present

## 2024-05-14 DIAGNOSIS — Z953 Presence of xenogenic heart valve: Secondary | ICD-10-CM | POA: Insufficient documentation

## 2024-05-14 DIAGNOSIS — I351 Nonrheumatic aortic (valve) insufficiency: Secondary | ICD-10-CM | POA: Insufficient documentation

## 2024-05-14 DIAGNOSIS — I5022 Chronic systolic (congestive) heart failure: Secondary | ICD-10-CM | POA: Diagnosis present

## 2024-05-14 NOTE — Progress Notes (Signed)
 ADVANCED HEART FAILURE FOLLOW UP CLINIC NOTE  Referring Physician: Antonette Angeline ORN, NP  Primary Care: Antonette Angeline ORN, NP Primary Cardiologist:  HPI: Christopher Ball is a 53 y.o. male who presents for follow up of HFrEF, aortic valve disease, pulmonary hypertension.      Mr. Laurich underwent aortic root replacement with a Medtronic freestyle 27 mm aortic valve and ascending aortic replacement with a 24 mm Hemashield graft with coronary artery reimplantation in 2007 with Dr. Army. He refused mechanical valve with obligate long term Coumadin at the time.    He was seen in cardiology follow up and complained of progressive symptoms. Cardiac CT 06/02/23 showed CAC of 0, mild HALT of prosthetic valve and mild root dilation. There was also evidence of partial anomalous pulmonary venous return with evidence of an atrial septal defect and with associated right ventricular dilation.    Follow up echo 06/05/23 showed EF 55%, mod pulm HTN, S/p bioprosthetic AVR with known restricted R/L cusps with eccentric moderate to severe AI. PHT 198 ms and holodiastolic flow reversal. There was moderate aortic valve stenosis with 27 mmHg, peak grad 44.1 mmHg, AVA 1.1 cm2, DVI 0.39.    TEE 06/25/23 showed EF 60%, moderate pericardial effusion, severe aortic regurgitation with an eccentric AI jet and a prolapsed leaflet with PHT . There was moderate AS with a mean gradient of 32 mmhg and moderate PFO with predominantly L->R shunting. Elevated aortic gradients felt to be 2/2 AI vs true aortic stenosis. He underwent RHC with biventricular pressure elevation and severely reduced cardiac index by Fick despite left to right shunt. He was admitted for diuresis, responded poorly initially then developed shock requiring dobutamine  and norepinephrine  to maintain urine output. Lactate improved and patient underwent ViV emergent TAVR on 10/27 with a 26mm Edwards valve. Improved following, though EF had a significant drop  following the procedure.      SUBJECTIVE:  Patient reports doing very well.  He denies any symptoms of shortness of breath, fatigue, chest pain.  His back still irritates him but is no worse than it was at his previous visit.  He has been taking his medications as ordered but would like to cut back on some of his medicines.  Again discussed his partial anomalous pulmonary venous return and potential ASD, will evaluate with echocardiogram.  PMH, current medications, allergies, social history, and family history reviewed in epic.  PHYSICAL EXAM: Vitals:   05/14/24 0923  BP: 130/82  Pulse: (!) 56  SpO2: 96%    GENERAL: Thin, marfanoid habitus PULM:  Normal work of breathing, clear to auscultation bilaterally. Respirations are unlabored.  CARDIAC:  JVP: flat         Bradycardic rate with regular rhythm.  Systolic murmur.  No edema. Warm and well perfused extremities. ABDOMEN: Soft, non-tender, non-distended. NEUROLOGIC: Patient is oriented x3 with no focal or lateralizing neurologic deficits.    DATA REVIEW  ECG: 07/23/23: sinus with LVH and repol abnormalities, ST/TW abnormalities, HR 74      ECHO: 07/14/23: LVEF 25 to 30%, global hypokinesis, moderately reduced RV function, mildly enlarged, 26 mm TAVR valve within prior freestyle graft, no paravalvular leak. 12/24: LVEF 40-45%, RV mildly reduced function, normal PASP   CATH: 07/10/23: Normal coronary arteries, right dominant, severely elevated biventricular pressures with evidence of left-to-right shunt despite increase in oxygen saturation from SVC to PA.   CCTA: Prior thoracic aortic root graft with 27 mm Medtronic freestyle valve, partial anomalous pulmonary venous return and  ASD with mild RV dilation.  ASSESSMENT & PLAN:  Chronic systolic heart failure: Previously normal ejection fraction, reduced ejection fraction noted after emergent valve in valve TAVR.  Last EF 40-45% on 08/2023. Overall doing well, NYHA class I  symptoms. Given no volume symptoms and desire to decrease pill burden can stop spironolactone .  - Continue losartan  50mg  - Continue metoprolol  25mg  daily - Continue jardiance  10mg  daily - Stop spironolactone  - Euvolemic - If EF still reduced can increase losartan  -Repeat echo in 07/2024   Severe aortic regurgitation s/p ViV TAVR: History of Marfan syndrome with prior Medtronic freestyle graft placement, complicated by leaflet failure and severe chronic aortic regurgitation leading to cardiogenic shock. Well functioning TAVR valve after emergent ViV TAVR.  -Follows with structural, repeat echo pending.  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  Mild RV dilation and dysfunction noted on echo, will discuss if further intervention needed after RHC.  -Repeat echo in December  Follow up in 4 months   Morene Brownie, MD Advanced Heart Failure Mechanical Circulatory Support 05/14/24

## 2024-05-14 NOTE — Patient Instructions (Signed)
 STOP Spironolactone .  Your physician recommends that you schedule a follow-up appointment in: 4 months (December) ** PLEASE CALL THE OFFICE IN OCTOBER TO ARRANGE YOUR FOLLOW UP APPOINTMENT.**  If you have any questions or concerns before your next appointment please send us  a message through [place] or call our office at [phone].    TO LEAVE A MESSAGE FOR THE NURSE SELECT OPTION 2, PLEASE LEAVE A MESSAGE INCLUDING: YOUR NAME DATE OF BIRTH CALL BACK NUMBER REASON FOR CALL**this is important as we prioritize the call backs  YOU WILL RECEIVE A CALL BACK THE SAME DAY AS LONG AS YOU CALL BEFORE 4:00 PM  At the [facility], you and your health needs are our priority. As part of our continuing mission to provide you with exceptional heart care, we have created designated Provider Care Teams. These Care Teams include your primary Cardiologist (physician) and Advanced Practice Providers (APPs- Physician Assistants and Nurse Practitioners) who all work together to provide you with the care you need, when you need it.   You may see any of the following providers on your designated Care Team at your next follow up: Dr Toribio Fuel Dr Ezra Shuck Dr. Ria Commander Dr. Morene Brownie Amy Lenetta, NP Caffie Shed, [place] [facility] [place], [place] Beckey Coe, NP [place] Lee, NP Ellouise Class, NP Tinnie Redman, PharmD Jaun Bash, PharmD   Please be sure to bring in all your medications bottles to every appointment.    Thank you for choosing [facility]

## 2024-05-16 ENCOUNTER — Other Ambulatory Visit: Payer: Self-pay

## 2024-06-16 ENCOUNTER — Other Ambulatory Visit: Payer: Self-pay | Admitting: Internal Medicine

## 2024-06-16 ENCOUNTER — Other Ambulatory Visit (HOSPITAL_COMMUNITY): Payer: Self-pay

## 2024-06-16 MED ORDER — METOPROLOL SUCCINATE ER 25 MG PO TB24
25.0000 mg | ORAL_TABLET | Freq: Every day | ORAL | 3 refills | Status: AC
  Filled 2024-06-16 – 2024-07-17 (×4): qty 30, 30d supply, fill #0
  Filled 2024-08-17: qty 30, 30d supply, fill #1
  Filled 2024-09-26 – 2024-10-13 (×2): qty 30, 30d supply, fill #2

## 2024-06-22 ENCOUNTER — Other Ambulatory Visit (HOSPITAL_COMMUNITY): Payer: Self-pay

## 2024-06-23 ENCOUNTER — Other Ambulatory Visit (HOSPITAL_COMMUNITY): Payer: Self-pay

## 2024-07-05 ENCOUNTER — Other Ambulatory Visit (HOSPITAL_COMMUNITY): Payer: Self-pay

## 2024-07-15 ENCOUNTER — Other Ambulatory Visit (HOSPITAL_COMMUNITY): Payer: Self-pay | Admitting: Cardiology

## 2024-07-15 ENCOUNTER — Other Ambulatory Visit: Payer: Self-pay | Admitting: Physician Assistant

## 2024-07-17 ENCOUNTER — Other Ambulatory Visit: Payer: Self-pay

## 2024-07-19 ENCOUNTER — Other Ambulatory Visit (HOSPITAL_COMMUNITY): Payer: Self-pay

## 2024-07-19 ENCOUNTER — Telehealth (HOSPITAL_COMMUNITY): Payer: Self-pay

## 2024-07-19 ENCOUNTER — Telehealth (HOSPITAL_COMMUNITY): Payer: Self-pay | Admitting: Pharmacy Technician

## 2024-07-19 NOTE — Telephone Encounter (Signed)
 Advanced Heart Failure Patient Advocate Encounter  Prior authorization for Jardiance  has been submitted and approved. Test billing returns $65 for 90 day supply. This patient is eligible to use copay savings cards.  [employer] Effective: 07/19/2024 to 07/19/2025  Rachel DEL, CPhT Rx Patient Advocate Phone: [phone]

## 2024-07-23 ENCOUNTER — Ambulatory Visit (INDEPENDENT_AMBULATORY_CARE_PROVIDER_SITE_OTHER): Admitting: Internal Medicine

## 2024-07-23 ENCOUNTER — Encounter: Payer: Self-pay | Admitting: Internal Medicine

## 2024-07-23 VITALS — BP 102/64 | Ht 79.0 in | Wt 169.0 lb

## 2024-07-23 DIAGNOSIS — R739 Hyperglycemia, unspecified: Secondary | ICD-10-CM

## 2024-07-23 DIAGNOSIS — R1310 Dysphagia, unspecified: Secondary | ICD-10-CM | POA: Diagnosis not present

## 2024-07-23 DIAGNOSIS — Z125 Encounter for screening for malignant neoplasm of prostate: Secondary | ICD-10-CM

## 2024-07-23 DIAGNOSIS — Z0001 Encounter for general adult medical examination with abnormal findings: Secondary | ICD-10-CM | POA: Diagnosis not present

## 2024-07-23 DIAGNOSIS — E782 Mixed hyperlipidemia: Secondary | ICD-10-CM | POA: Diagnosis not present

## 2024-07-23 NOTE — Progress Notes (Signed)
 Subjective:    Patient ID: Christopher Ball, male    DOB: [birth year], 53 y.o.   MRN: [id number]  HPI  Patient presents to clinic today for his annual exam.  He also reports difficulty swallowing.  He reports this only occurs with hotdog and hamburger buns.  He reports they get stuck in his lower esophagus.  He reports it will then cause a burning sensation in his upper abdomen.  He reports sometimes he will vomit clear mucus afterwards.  Flu: 07/2023 Tetanus: 03/2022 COVID: X 2 Pneumovax: 07/2012 Prevnar 20: Never Shingrix: Never PSA screening: 07/2022 Colon screening: 08/2022 Vision screening: annually Dentist: biannually  Diet: He does eat meat. He consumes fruits and veggies. He does eat fried foods. He drinks mostly sweet tea. 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 Wendel & Dr. Lucas   Stomach problems    Thoracic aortic aneurysm    replacement by Dr. Katherene Parker /30/07 with aortic bioprosthetic valve   Tobacco abuse     Current Outpatient Medications  Medication Sig Dispense Refill   aspirin  81 MG chewable tablet Chew 81 mg by mouth daily.     clindamycin  (CLEOCIN ) 300 MG capsule Take 2 tablets 1 hour prior to dental appointment (Patient not taking: Reported on 05/14/2024) 2 capsule 3   empagliflozin  (JARDIANCE ) 10 MG TABS tablet Take 1 tablet (10 mg total) by mouth daily. 30 tablet 6   levocetirizine (XYZAL ) 5 MG tablet Take 5 mg by mouth every evening.     losartan  (COZAAR ) 50 MG tablet TAKE 1 TABLET BY MOUTH EVERY DAY 90 tablet 3   metoprolol  succinate (TOPROL -XL) 25 MG 24 hr tablet Take 1 tablet (25 mg total) by mouth at bedtime. 30 tablet 3   Multiple Vitamins-Minerals (MULTIVITAMIN WITH MINERALS) tablet Take 1 tablet by mouth daily.     OVER THE COUNTER MEDICATION  Take 1 tablet by mouth daily. Medication: Prevacid  Extra Strength taking daily     tadalafil  (CIALIS ) 20 MG tablet Take 1 tablet (20 mg total) by mouth daily. 10 tablet 1   VITAMIN E PO Take by mouth.     No current facility-administered medications for this visit.    Allergies  Allergen Reactions   Augmentin [Amoxicillin-Pot Clavulanate] Shortness Of Breath and Nausea And Vomiting    Family History  Problem Relation Age of Onset   Asthma Mother    Early death Father    Heart disease Father    Asthma Brother    Diabetes Maternal Grandmother    Cancer Neg Hx    Stroke Neg Hx    Kidney disease Neg Hx    Hypertension Neg Hx    Hyperlipidemia Neg Hx     Social History   Socioeconomic History   Marital status: Married    Spouse name: Not on file   Number of children: Not on file   Years of education: Not on file   Highest education level: Not on file  Occupational History   Not on file  Tobacco Use   Smoking status: Former    Average packs/day: 1 pack/day for 31.0 years (31.0 ttl pk-yrs)    Types: Cigars, Cigarettes    Start date: 07/09/1992   Smokeless tobacco: Never  Vaping Use  Vaping status: Every Day  Substance and Sexual Activity   Alcohol use: Yes    Alcohol/week: 1.0 standard drink of alcohol    Types: 1 Shots of liquor per week    Comment: rare   Drug use: Yes    Types: Marijuana   Sexual activity: Yes  Other Topics Concern   Not on file  Social History Narrative   Not on file   Social Drivers of Health   Financial Resource Strain: Not on file  Food Insecurity: No Food Insecurity (07/11/2023)   Hunger Vital Sign    Worried About Running Out of Food in the Last Year: Never true    Ran Out of Food in the Last Year: Never true  Transportation Needs: No Transportation Needs (07/11/2023)   PRAPARE - Administrator, Civil Service (Medical): No    Lack of Transportation (Non-Medical): No  Physical Activity: Not on file  Stress: Not on  file  Social Connections: Not on file  Intimate Partner Violence: Not At Risk (07/11/2023)   Humiliation, Afraid, Rape, and Kick questionnaire    Fear of Current or Ex-Partner: No    Emotionally Abused: No    Physically Abused: No    Sexually Abused: No     Constitutional: Denies fever, malaise, fatigue, headache or abrupt weight changes.  HEENT: Denies eye pain, eye redness, ear pain, ringing in the ears, wax buildup, runny nose, nasal congestion, bloody nose, or sore throat. Respiratory: Denies difficulty breathing, shortness of breath, cough or sputum production.   Cardiovascular: Denies chest pain, chest tightness, palpitations or swelling in the hands or feet.  Gastrointestinal: Pt reports dysphagia. Denies abdominal pain, bloating, constipation, diarrhea or blood in the stool.  GU: Denies urgency, frequency, pain with urination, burning sensation, blood in urine, odor or discharge. Musculoskeletal: Denies decrease in range of motion, difficulty with gait, muscle pain or joint pain and swelling.  Skin: Denies redness, rashes, lesions or ulcercations.  Neurological: Denies dizziness, difficulty with memory, difficulty with speech or problems with balance and coordination.  Psych: Denies anxiety, depression, SI/HI.  No other specific complaints in a complete review of systems (except as listed in HPI above).  Objective:   BP 102/64 (BP Location: Left Arm, Patient Position: Sitting, Cuff Size: Normal)   Ht 6' 7 (2.007 m)   Wt 169 lb (76.7 kg)   BMI 19.04 kg/m    Wt Readings from Last 3 Encounters:  05/14/24 165 lb 3.2 oz (74.9 kg)  02/02/24 166 lb 12.8 oz (75.7 kg)  12/22/23 159 lb 12.8 oz (72.5 kg)    General: Appears his stated age, well nourished, in NAD. Skin: Warm, dry and intact.  HEENT: Head: normal shape and size; Eyes: sclera white, no icterus, conjunctiva pink, PERRLA and EOMs intact;  Neck:  Neck supple, trachea midline. No masses, lumps or thyromegaly present.   Cardiovascular: Normal rate and rhythm. S1,S2 noted.  Murmur noted. No JVD or BLE edema. No carotid bruits noted. Pulmonary/Chest: Normal effort and positive vesicular breath sounds. No respiratory distress. No wheezes, rales or ronchi noted.  Abdomen: Soft and nontender. Normal bowel sounds.  Musculoskeletal: Strength 5/5 BUE/BLE.  No difficulty with gait.  Neurological: Alert and oriented. Cranial nerves II-XII grossly intact. Coordination normal.  Psychiatric: Mood and affect mildly flat. Behavior is normal. Judgment and thought content normal.   BMET    Component Value Date/Time   NA 143 09/04/2023 0914   K 4.8 09/04/2023 0914   CL 107 (H)  09/04/2023 0914   CO2 23 09/04/2023 0914   GLUCOSE 75 09/04/2023 0914   GLUCOSE 110 (H) 07/23/2023 2131   BUN 19 09/04/2023 0914   CREATININE 1.11 09/04/2023 0914   CREATININE 1.16 01/16/2023 0926   CALCIUM  9.7 09/04/2023 0914   GFRNONAA >60 07/23/2023 2131   GFRAA 78 06/23/2017 0917    Lipid Panel     Component Value Date/Time   CHOL 150 01/16/2023 0926   CHOL 166 08/24/2019 0942   TRIG 68 01/16/2023 0926   HDL 49 01/16/2023 0926   HDL 50 08/24/2019 0942   CHOLHDL 3.1 01/16/2023 0926   LDLCALC 86 01/16/2023 0926    CBC    Component Value Date/Time   WBC 9.8 07/23/2023 2131   RBC 3.87 (L) 07/23/2023 2131   HGB 12.4 (L) 07/23/2023 2131   HGB 12.6 (L) 07/23/2023 1417   HCT 38.8 (L) 07/23/2023 2131   HCT 39.1 07/23/2023 1417   PLT 294 07/23/2023 2131   PLT 289 07/23/2023 1417   MCV 100.3 (H) 07/23/2023 2131   MCV 100 (H) 07/23/2023 1417   MCH 32.0 07/23/2023 2131   MCHC 32.0 07/23/2023 2131   RDW 13.9 07/23/2023 2131   RDW 11.8 07/23/2023 1417   LYMPHSABS 2.3 01/01/2014 2000   MONOABS 0.6 01/01/2014 2000   EOSABS 0.4 01/01/2014 2000   BASOSABS 0.1 01/01/2014 2000    Hgb A1C Lab Results  Component Value Date   HGBA1C 5.6 07/13/2023            Assessment & Plan:   Preventative health maintenance:  Flu shot  declined Tetanus UTD Encouraged him to get his COVID booster Pneumovax UTD, Prevnar 20 declined Discussed Shingrix vaccine, he will check coverage with his insurance company and schedule a visit if he would like to have this done Colon screening UTD Encouraged him to consume a balanced diet and exercise regimen Advised him to see an eye doctor and dentist annually Will check CBC, c-Met, lipid, A1c and PSA today  Dysphagia:  Referral to GI for upper endoscopy to rule out stenosis Advised him to try Prilosec 40 mg OTC daily to see if symptoms improve  RTC in 6 months, follow-up chronic conditions Angeline Laura, NP

## 2024-07-23 NOTE — Patient Instructions (Signed)
 Health Maintenance, Male  Adopting a healthy lifestyle and getting preventive care are important in promoting health and wellness. Ask your health care provider about:  The right schedule for you to have regular tests and exams.  Things you can do on your own to prevent diseases and keep yourself healthy.  What should I know about diet, weight, and exercise?  Eat a healthy diet    Eat a diet that includes plenty of vegetables, fruits, low-fat dairy products, and lean protein.  Do not eat a lot of foods that are high in solid fats, added sugars, or sodium.  Maintain a healthy weight  Body mass index (BMI) is a measurement that can be used to identify possible weight problems. It estimates body fat based on height and weight. Your health care provider can help determine your BMI and help you achieve or maintain a healthy weight.  Get regular exercise  Get regular exercise. This is one of the most important things you can do for your health. Most adults should:  Exercise for at least 150 minutes each week. The exercise should increase your heart rate and make you sweat (moderate-intensity exercise).  Do strengthening exercises at least twice a week. This is in addition to the moderate-intensity exercise.  Spend less time sitting. Even light physical activity can be beneficial.  Watch cholesterol and blood lipids  Have your blood tested for lipids and cholesterol at 52 years of age, then have this test every 5 years.  You may need to have your cholesterol levels checked more often if:  Your lipid or cholesterol levels are high.  You are older than 52 years of age.  You are at high risk for heart disease.  What should I know about cancer screening?  Many types of cancers can be detected early and may often be prevented. Depending on your health history and family history, you may need to have cancer screening at various ages. This may include screening for:  Colorectal cancer.  Prostate cancer.  Skin cancer.  Lung  cancer.  What should I know about heart disease, diabetes, and high blood pressure?  Blood pressure and heart disease  High blood pressure causes heart disease and increases the risk of stroke. This is more likely to develop in people who have high blood pressure readings or are overweight.  Talk with your health care provider about your target blood pressure readings.  Have your blood pressure checked:  Every 3-5 years if you are 24-52 years of age.  Every year if you are 3 years old or older.  If you are between the ages of 60 and 72 and are a current or former smoker, ask your health care provider if you should have a one-time screening for abdominal aortic aneurysm (AAA).  Diabetes  Have regular diabetes screenings. This checks your fasting blood sugar level. Have the screening done:  Once every three years after age 66 if you are at a normal weight and have a low risk for diabetes.  More often and at a younger age if you are overweight or have a high risk for diabetes.  What should I know about preventing infection?  Hepatitis B  If you have a higher risk for hepatitis B, you should be screened for this virus. Talk with your health care provider to find out if you are at risk for hepatitis B infection.  Hepatitis C  Blood testing is recommended for:  Everyone born from [birth year] through 1964.  Anyone  with known risk factors for hepatitis C.  Sexually transmitted infections (STIs)  You should be screened each year for STIs, including gonorrhea and chlamydia, if:  You are sexually active and are younger than 53 years of age.  You are older than 52 years of age and your health care provider tells you that you are at risk for this type of infection.  Your sexual activity has changed since you were last screened, and you are at increased risk for chlamydia or gonorrhea. Ask your health care provider if you are at risk.  Ask your health care provider about whether you are at high risk for HIV. Your health care provider  may recommend a prescription medicine to help prevent HIV infection. If you choose to take medicine to prevent HIV, you should first get tested for HIV. You should then be tested every 3 months for as long as you are taking the medicine.  Follow these instructions at home:  Alcohol use  Do not drink alcohol if your health care provider tells you not to drink.  If you drink alcohol:  Limit how much you have to 0-2 drinks a day.  Know how much alcohol is in your drink. In the [place] one drink equals one 12 oz bottle of beer (355 mL), one 5 oz glass of wine (148 mL), or one 1 oz glass of hard liquor (44 mL).  Lifestyle  Do not use any products that contain nicotine or tobacco. These products include cigarettes, chewing tobacco, and vaping devices, such as e-cigarettes. If you need help quitting, ask your health care provider.  Do not use street drugs.  Do not share needles.  Ask your health care provider for help if you need support or information about quitting drugs.  General instructions  Schedule regular health, dental, and eye exams.  Stay current with your vaccines.  Tell your health care provider if:  You often feel depressed.  You have ever been abused or do not feel safe at home.  Summary  Adopting a healthy lifestyle and getting preventive care are important in promoting health and wellness.  Follow your health care provider's instructions about healthy diet, exercising, and getting tested or screened for diseases.  Follow your health care provider's instructions on monitoring your cholesterol and blood pressure.  This information is not intended to replace advice given to you by your health care provider. Make sure you discuss any questions you have with your health care provider.  Document Revised: 01/22/2021 Document Reviewed: 01/22/2021  Elsevier Patient Education  2024 [employer].

## 2024-07-24 LAB — COMPREHENSIVE METABOLIC PANEL WITH GFR
AG Ratio: 1.8 (calc) (ref 1.0–2.5)
ALT: 16 U/L (ref 9–46)
AST: 24 U/L (ref 10–35)
Albumin: 4.3 g/dL (ref 3.6–5.1)
Alkaline phosphatase (APISO): 51 U/L (ref 35–144)
BUN: 23 mg/dL (ref 7–25)
CO2: 23 mmol/L (ref 20–32)
Calcium: 9.4 mg/dL (ref 8.6–10.3)
Chloride: 109 mmol/L (ref 98–110)
Creat: 0.97 mg/dL (ref 0.70–1.30)
Globulin: 2.4 g/dL (ref 1.9–3.7)
Glucose, Bld: 79 mg/dL (ref 65–99)
Potassium: 4.8 mmol/L (ref 3.5–5.3)
Sodium: 139 mmol/L (ref 135–146)
Total Bilirubin: 0.3 mg/dL (ref 0.2–1.2)
Total Protein: 6.7 g/dL (ref 6.1–8.1)
eGFR: 94 mL/min/1.73m2 (ref >=60)

## 2024-07-24 LAB — CBC
HCT: 42.2 % (ref 38.5–50.0)
Hemoglobin: 14.2 g/dL (ref 13.2–17.1)
MCH: 32.9 pg (ref 27.0–33.0)
MCHC: 33.6 g/dL (ref 32.0–36.0)
MCV: 97.7 fL (ref 80.0–100.0)
MPV: 12.4 fL (ref 7.5–12.5)
Platelets: 189 Thousand/uL (ref 140–400)
RBC: 4.32 Million/uL (ref 4.20–5.80)
RDW: 12.4 % (ref 11.0–15.0)
WBC: 8.8 Thousand/uL (ref 3.8–10.8)

## 2024-07-24 LAB — HEMOGLOBIN A1C
Hgb A1c MFr Bld: 5.3 % (ref <5.7)
Mean Plasma Glucose: 105 mg/dL
eAG (mmol/L): 5.8 mmol/L

## 2024-07-24 LAB — LIPID PANEL
Cholesterol: 223 mg/dL — ABNORMAL HIGH (ref <200)
HDL: 43 mg/dL (ref >=40)
LDL Cholesterol (Calc): 163 mg/dL — ABNORMAL HIGH
Non-HDL Cholesterol (Calc): 180 mg/dL — ABNORMAL HIGH (ref <130)
Total CHOL/HDL Ratio: 5.2 (calc) — ABNORMAL HIGH (ref <5.0)
Triglycerides: 73 mg/dL (ref <150)

## 2024-07-24 LAB — PSA: PSA: 0.64 ng/mL (ref <=4.00)

## 2024-07-26 ENCOUNTER — Ambulatory Visit: Payer: Self-pay | Admitting: Internal Medicine

## 2024-08-02 ENCOUNTER — Ambulatory Visit: Admitting: Physician Assistant

## 2024-08-02 ENCOUNTER — Ambulatory Visit (HOSPITAL_COMMUNITY)

## 2024-08-04 ENCOUNTER — Other Ambulatory Visit (HOSPITAL_COMMUNITY): Payer: Federal, State, Local not specified - PPO

## 2024-08-04 ENCOUNTER — Ambulatory Visit: Payer: Federal, State, Local not specified - PPO

## 2024-08-09 ENCOUNTER — Ambulatory Visit (HOSPITAL_COMMUNITY): Admitting: Cardiology

## 2024-08-19 ENCOUNTER — Other Ambulatory Visit: Payer: Self-pay

## 2024-08-27 NOTE — Progress Notes (Addendum)
 [facility]   MULTIDISCIPLINARY HEART VALVE CLINIC                                     Cardiology Office Note:    Date:  08/30/2024   ID:  Christopher Ball, DOB 01/07/1971, MRN [id number]  PCP:  Christopher Angeline ORN, NP  [facility] Cardiologist:  Christopher Lesches, MD  [facility] Structural heart: Christopher MARLA Red, MD [facility] Electrophysiologist:  None   Referring MD: Christopher Angeline ORN, NP   1 year s/p VIV TAVR  History of Present Illness:    Christopher Ball is a 53 y.o. 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   Christopher Ball underwent aortic root replacement with a Medtronic freestyle 27 mm aortic valve and ascending aortic replacement with a 24 mm Hemashield graft with coronary artery reimplantation in 2007 with Dr. Army. He refused mechanical valve with obligate long term Coumadin at the time.   Cardiac CT 06/02/23 showed CAC of 0, mild HALT of prosthetic valve and mild root dilation. There was also evidence of partial anomalous pulmonary venous return with evidence of an atrial septal defect and with associated right ventricular dilation   TEE 06/25/23 showed EF 60%, moderate pericardial effusion, severe aortic regurgitation with an eccentric AI jet and a prolapsed leaflet with PHT . He developed cardiogenic shock and underwent urgent VIV TAVR with a 26 mm Edwards S3UR via the TF approach using Sentinel Cerebral Embolic Protection on 07/13/23. He was started on Eliquis  2.5mg  BID x 3 months followed by aspirin  81mg  daily indefinitely. POD1 echo showed EF 25%, normally functioning TAVR with a mean gradient  of 8 mmHg and no PVL. LV dysfunction felt to be 2/2 stress induced CM and he was started on GDMT. Echo 08/22/23 showed improvement in EF to 40-45%.   Today the patient presents to clinic for follow up. Here with his wife. Having bilateral shoulder pain. Previously had left shoulder pain from a Frisbee accident but now having similar pain in right without injury. Has scoliosis and back pain. He does not work but stays very active all day. He gets a little chest pinching with exertion that is relieved by rest. No SOB. No LE edema, orthopnea or PND. No dizziness or syncope. No blood in stool or urine. No palpitations. Has an itchy groin rash that has been ongoing. Has a strong desire to fix anything that needs fixing. Also likes the food at the hospital.   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 Red & Dr. Lucas   Stomach problems    Thoracic aortic aneurysm    replacement by Dr. Katherene Parker /30/07 with aortic bioprosthetic valve   Tobacco abuse      Current Medications: Active Medications[1]    ROS:   Please see the history of present illness.    All other systems reviewed and are negative.  EKGs       Risk Assessment/Calculations:           Physical Exam:    VS:  BP 116/68   Pulse 65   Ht 6' 6 (1.981 m)   Wt 170 lb (77.1 kg)   SpO2 97%   BMI 19.65 kg/m     Wt Readings from Last 3 Encounters:  08/30/24 170 lb (77.1 kg)  07/23/24 169 lb (76.7 kg)  05/14/24 165 lb 3.2 oz (74.9 kg)     GEN: Tall thin man with pectus excavatum.  NECK: No JVD CARDIAC: RRR, 2/6 flow murmur heard throughout precordium. No rubs, gallops RESPIRATORY:  Clear to auscultation without rales, wheezing or rhonchi  ABDOMEN: Soft, non-tender, non-distended EXTREMITIES:  No edema; No deformity. Rash in groin bilaterally.   ASSESSMENT:     1. S/P VIV TAVR (transcatheter aortic valve replacement)   2. HFrEF (heart failure with reduced ejection fraction) (HCC)   3. ASD (atrial septal defect), ostium secundum     PLAN:    In order of problems listed above:  Bioprosthetic severe AI s/p VIV TAVR:  -- Echo shows EF 65-75%, mild LVH, normally functioning VIV TAVR with mean gradient of 18 mm hg and no PVL. Gradients increased but LVEF improved. -- NYHA class I symptoms.  -- Stop clindamycin  and substitute azithromycin  for SBE prophylaxis given increased risk for C. Diff with clinda.  -- Continue regular follow up with Dr. Zenaida.    Chronic HFrEF:  -- EF improved to 60-65% on echo today.  -- No s/s CHF. -- Continue on losartan  50mg  daily and Toprol  XL 25mg  at bedtime. -- Stop Jardiance  10mg  daily given pruritic rash in groin c/w yeast infection. Will Rx nystatin  ointment.  -- Recent labs reviewed. Creat 0.97. K 4.8.   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  -- RV normal on echo today. -- Discussed with Dr. Zenaida who said percutaneous closure usually involves stenting for the PAPVR, if a candidate and would require a referral to [facility].  -- Pt initially had expressed a  strong desire for this to be fixed but understands that with normal RV and lack of symptoms, continued surveillance is recommended.  -- Continue follow up with Dr. Zenaida.      Medication Adjustments/Labs and Tests Ordered: Current medicines are reviewed at length with the patient today.  Concerns regarding medicines are outlined above.  No orders of the defined types were placed in this encounter.  Meds ordered this encounter  Medications   nystatin  ointment (MYCOSTATIN )    Sig: Apply 1 Application topically 2 (two) times daily.    Dispense:  30 g    Refill:  3    Supervising Provider:   COOPER, MICHAEL [[id number]]   azithromycin  (ZITHROMAX ) 500  MG tablet    Sig: Take 1 tablet (500 mg total) by mouth as directed. Take one tablet 1 hour before any dental work including cleanings.    Dispense:  6 tablet    Refill:  12    Supervising Provider:   WONDA SHARPER [[id number]]    Patient Instructions  Medication Instructions:  Your physician has recommended you make the following change in your medication:  STOP Clindamycin . No longer take prior yo dental procedures. START Azithromycin  500 mg, take 1 tablet by mouth 1 hour prior to dental procedures and cleanings. Sent to pharmacy.  STOP  Jardiance .  START nystatin  ointment, applying to affected area twice a day until resolved. Sent to pharmacy.  *If you need a refill on your cardiac medications before your next appointment, please call your pharmacy*  Lab Work: None needed If you have labs (blood work) drawn today and your tests are completely normal, you will receive your results only by: MyChart Message (if you have MyChart) OR A paper copy in the mail If you have any lab test that is abnormal or we need to change your treatment, we will call you to review the results.  Testing/Procedures: None needed  Follow-Up: At [facility], you and your health needs are our priority.  As part of our continuing mission to provide you with exceptional heart care, our providers are all part of one team.  This team includes your primary Cardiologist (physician) and Advanced Practice Providers or APPs (Physician Assistants and Nurse Practitioners) who all work together to provide you with the care you need, when you need it.  Your next appointment:   6 month(s)  Provider:   Dr. Zenaida  We recommend signing up for the patient portal called MyChart.  Sign up information is provided on this After Visit Summary.  MyChart is used to connect with patients for Virtual Visits (Telemedicine).  Patients are able to view lab/test results, encounter notes, upcoming appointments, etc.  Non-urgent  messages can be sent to your provider as well.   To learn more about what you can do with MyChart, go to [email or web address].             Signed, Lamarr Hummer, PA-C  08/30/2024 1:04 PM    [facility]     [1]  Current Meds  Medication Sig   aspirin  81 MG chewable tablet Chew 81 mg by mouth daily.   azithromycin  (ZITHROMAX ) 500 MG tablet Take 1 tablet (500 mg total) by mouth as directed. Take one tablet 1 hour before any dental work including cleanings.   levocetirizine (XYZAL ) 5 MG tablet Take 5 mg by mouth every evening.   losartan  (COZAAR ) 50 MG tablet TAKE 1 TABLET BY MOUTH EVERY DAY   metoprolol  succinate (TOPROL -XL) 25 MG 24 hr tablet Take 1 tablet (25 mg total) by mouth at bedtime.   Multiple Vitamins-Minerals (MULTIVITAMIN WITH MINERALS) tablet Take 1 tablet by mouth daily.   nystatin  ointment (MYCOSTATIN ) Apply 1 Application topically 2 (two) times daily.   tadalafil  (CIALIS ) 20 MG tablet Take 1 tablet (20 mg total) by mouth daily.   VITAMIN E PO Take by mouth.   [DISCONTINUED] clindamycin  (CLEOCIN ) 300 MG capsule Take 2 tablets 1 hour prior to dental appointment   [DISCONTINUED] empagliflozin  (JARDIANCE ) 10 MG TABS tablet Take 1 tablet (10 mg total) by mouth daily.

## 2024-08-30 ENCOUNTER — Ambulatory Visit: Payer: Self-pay | Admitting: Physician Assistant

## 2024-08-30 ENCOUNTER — Ambulatory Visit: Admitting: Physician Assistant

## 2024-08-30 ENCOUNTER — Ambulatory Visit: Admission: RE | Admit: 2024-08-30 | Discharge: 2024-08-30 | Attending: Physician Assistant

## 2024-08-30 VITALS — BP 116/68 | HR 65 | Ht 78.0 in | Wt 170.0 lb

## 2024-08-30 DIAGNOSIS — Q2111 Secundum atrial septal defect: Secondary | ICD-10-CM | POA: Insufficient documentation

## 2024-08-30 DIAGNOSIS — Z952 Presence of prosthetic heart valve: Secondary | ICD-10-CM

## 2024-08-30 DIAGNOSIS — I5020 Unspecified systolic (congestive) heart failure: Secondary | ICD-10-CM | POA: Insufficient documentation

## 2024-08-30 LAB — ECHOCARDIOGRAM COMPLETE
AR max vel: 1.22 cm2
AV Area VTI: 1.29 cm2
AV Area mean vel: 1.15 cm2
AV Mean grad: 18.3 mmHg
AV Peak grad: 33.1 mmHg
Ao pk vel: 2.88 m/s
Area-P 1/2: 2.52 cm2
S' Lateral: 1.5 cm

## 2024-08-30 MED ORDER — NYSTATIN 100000 UNIT/GM EX OINT: 1.0000 | TOPICAL_OINTMENT | Freq: Two times a day (BID) | CUTANEOUS | 3 refills | Status: AC

## 2024-08-30 MED ORDER — AZITHROMYCIN 500 MG PO TABS: 500.0000 mg | ORAL_TABLET | ORAL | 12 refills | Status: AC

## 2024-08-30 NOTE — Patient Instructions (Addendum)
 Medication Instructions:  Your physician has recommended you make the following change in your medication:  STOP Clindamycin . No longer take prior yo dental procedures. START Azithromycin  500 mg, take 1 tablet by mouth 1 hour prior to dental procedures and cleanings. Sent to pharmacy.  STOP Jardiance .  START nystatin  ointment, applying to affected area twice a day until resolved. Sent to pharmacy.  *If you need a refill on your cardiac medications before your next appointment, please call your pharmacy*  Lab Work: None needed If you have labs (blood work) drawn today and your tests are completely normal, you will receive your results only by: MyChart Message (if you have MyChart) OR A paper copy in the mail If you have any lab test that is abnormal or we need to change your treatment, we will call you to review the results.  Testing/Procedures: None needed  Follow-Up: At [facility], you and your health needs are our priority.  As part of our continuing mission to provide you with exceptional heart care, our providers are all part of one team.  This team includes your primary Cardiologist (physician) and Advanced Practice Providers or APPs (Physician Assistants and Nurse Practitioners) who all work together to provide you with the care you need, when you need it.  Your next appointment:   6 month(s)  Provider:   Dr. Zenaida  We recommend signing up for the patient portal called MyChart.  Sign up information is provided on this After Visit Summary.  MyChart is used to connect with patients for Virtual Visits (Telemedicine).  Patients are able to view lab/test results, encounter notes, upcoming appointments, etc.  Non-urgent messages can be sent to your provider as well.   To learn more about what you can do with MyChart, go to [email or web address].

## 2024-09-01 ENCOUNTER — Ambulatory Visit (HOSPITAL_COMMUNITY): Admitting: Cardiology

## 2024-09-27 ENCOUNTER — Encounter (HOSPITAL_COMMUNITY): Payer: Self-pay | Admitting: Cardiology

## 2024-09-27 ENCOUNTER — Ambulatory Visit (HOSPITAL_COMMUNITY)
Admission: RE | Admit: 2024-09-27 | Discharge: 2024-09-27 | Disposition: A | Source: Ambulatory Visit | Attending: Cardiology | Admitting: Cardiology

## 2024-09-27 VITALS — BP 130/70 | HR 58 | Wt 175.6 lb

## 2024-09-27 DIAGNOSIS — Z952 Presence of prosthetic heart valve: Secondary | ICD-10-CM

## 2024-09-27 DIAGNOSIS — Z79899 Other long term (current) drug therapy: Secondary | ICD-10-CM | POA: Insufficient documentation

## 2024-09-27 DIAGNOSIS — Z7982 Long term (current) use of aspirin: Secondary | ICD-10-CM | POA: Insufficient documentation

## 2024-09-27 DIAGNOSIS — Q263 Partial anomalous pulmonary venous connection: Secondary | ICD-10-CM | POA: Insufficient documentation

## 2024-09-27 DIAGNOSIS — Q8740 Marfan's syndrome, unspecified: Secondary | ICD-10-CM | POA: Diagnosis not present

## 2024-09-27 DIAGNOSIS — I359 Nonrheumatic aortic valve disorder, unspecified: Secondary | ICD-10-CM | POA: Diagnosis not present

## 2024-09-27 DIAGNOSIS — I5020 Unspecified systolic (congestive) heart failure: Secondary | ICD-10-CM | POA: Diagnosis not present

## 2024-09-27 DIAGNOSIS — I5022 Chronic systolic (congestive) heart failure: Secondary | ICD-10-CM | POA: Diagnosis present

## 2024-09-27 DIAGNOSIS — Z953 Presence of xenogenic heart valve: Secondary | ICD-10-CM | POA: Insufficient documentation

## 2024-09-27 DIAGNOSIS — I272 Pulmonary hypertension, unspecified: Secondary | ICD-10-CM | POA: Diagnosis not present

## 2024-09-27 DIAGNOSIS — Q2111 Secundum atrial septal defect: Secondary | ICD-10-CM

## 2024-09-27 NOTE — Progress Notes (Signed)
 "  ADVANCED HEART FAILURE FOLLOW UP CLINIC NOTE  Referring Physician: Antonette Ball ORN, NP  Primary Care: Christopher Ball ORN, NP Primary Cardiologist:  HPI: Christopher Ball is a 54 y.o. male who presents for follow up of HFrEF, aortic valve disease, pulmonary hypertension.      [name] underwent aortic root replacement with a Medtronic freestyle 27 mm aortic valve and ascending aortic replacement with a 24 mm Hemashield graft with coronary artery reimplantation in 2007 with Dr. Army. He refused mechanical valve with obligate long term Coumadin at the time.    He was seen in cardiology follow up and complained of progressive symptoms. Cardiac CT 06/02/23 showed CAC of 0, mild HALT of prosthetic valve and mild root dilation. There was also evidence of partial anomalous pulmonary venous return with evidence of an atrial septal defect and with associated right ventricular dilation.    Follow up echo 06/05/23 showed EF 55%, mod pulm HTN, S/p bioprosthetic AVR with known restricted R/L cusps with eccentric moderate to severe AI. PHT 198 ms and holodiastolic flow reversal. There was moderate aortic valve stenosis with 27 mmHg, peak grad 44.1 mmHg, AVA 1.1 cm2, DVI 0.39.    TEE 06/25/23 showed EF 60%, moderate pericardial effusion, severe aortic regurgitation with an eccentric AI jet and a prolapsed leaflet with PHT . There was moderate AS with a mean gradient of 32 mmhg and moderate PFO with predominantly L->R shunting. Elevated aortic gradients felt to be 2/2 AI vs true aortic stenosis. He underwent RHC with biventricular pressure elevation and severely reduced cardiac index by Fick despite left to right shunt. He was admitted for diuresis, responded poorly initially then developed shock requiring dobutamine  and norepinephrine  to maintain urine output. Lactate improved and patient underwent ViV emergent TAVR on 10/27 with a 26mm Edwards valve. Improved following, though EF had a significant drop  following the procedure.      SUBJECTIVE:  Overall doing very well, no complaints since his last visit.  Was taken off Jardiance  at his structural cardiology follow-up due to inguinal rash.  No volume symptoms, orthopnea, lower extremity swelling since that time.  We discussed his known shunt, given lack of symptoms and relatively normal RV as well as complexity of repair procedure we will just follow clinically with echocardiograms and symptoms at this time.  PMH, current medications, allergies, social history, and family history reviewed in epic.  PHYSICAL EXAM: Vitals:   09/27/24 0954  BP: 130/70  Pulse: (!) 58  SpO2: 97%     GENERAL: NAD, thin, marfanoid habitus PULM:  Normal work of breathing, CTAB CARDIAC:  JVP: Flat         Normal rate with regular rhythm.  Systolic murmur at the right upper sternal border, no edema warm and well perfused extremities. ABDOMEN: Soft, non-tender, non-distended. NEUROLOGIC: Patient is oriented x3 with no focal or lateralizing neurologic deficits.     DATA REVIEW  ECG: 07/23/23: sinus with LVH and repol abnormalities, ST/TW abnormalities, HR 74      ECHO: 07/14/23: LVEF 25 to 30%, global hypokinesis, moderately reduced RV function, mildly enlarged, 26 mm TAVR valve within prior freestyle graft, no paravalvular leak. 12/24: LVEF 40-45%, RV mildly reduced function, normal PASP 08/2024: LVEF 65 to 70%, normally functioning aortic valve, normal RV function, mildly dilated  CATH: 07/10/23: Normal coronary arteries, right dominant, severely elevated biventricular pressures with evidence of left-to-right shunt despite increase in oxygen saturation from SVC to PA.   CCTA: Prior thoracic aortic root  graft with 27 mm Medtronic freestyle valve, partial anomalous pulmonary venous return and ASD with mild RV dilation.  ASSESSMENT & PLAN:  Chronic systolic heart failure: Previously normal ejection fraction, reduced ejection fraction noted after  emergent valve in valve TAVR.  Now ejection fraction has improved to normal based on most recent echo from 08/2024.  NYHA class I symptoms, euvolemic. -Continue losartan  50 mg daily -Continue metoprolol  succinate 25 mg daily -Off Jardiance  due to inguinal rash -Off spironolactone  due to patient preference, improved EF, lack of volume symptoms -Repeat echo in 1 year   Severe aortic regurgitation s/p ViV TAVR: History of Marfan syndrome with prior Medtronic freestyle graft placement, complicated by leaflet failure and severe chronic aortic regurgitation leading to cardiogenic shock. Well functioning TAVR valve after emergent ViV TAVR.  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  No symptoms, normal RV function on echo. -Repeat echo in 1 year, follow serially  Follow up in 6 months   Morene Brownie, MD Advanced Heart Failure Mechanical Circulatory Support 09/27/2024 "

## 2024-09-27 NOTE — Patient Instructions (Signed)
 There has been no changes to your medications.  Your physician recommends that you schedule a follow-up appointment in: 6 months ( July ) ** PLEASE CALL THE OFFICE IN MAY TO ARRANGE YOUR FOLLOW UP APPOINTMENT.**  If you have any questions or concerns before your next appointment please send us  a message through [place] or call our office at [phone].    TO LEAVE A MESSAGE FOR THE NURSE SELECT OPTION 2, PLEASE LEAVE A MESSAGE INCLUDING: YOUR NAME DATE OF BIRTH CALL BACK NUMBER REASON FOR CALL**this is important as we prioritize the call backs  YOU WILL RECEIVE A CALL BACK THE SAME DAY AS LONG AS YOU CALL BEFORE 4:00 PM  At the [facility], you and your health needs are our priority. As part of our continuing mission to provide you with exceptional heart care, we have created designated Provider Care Teams. These Care Teams include your primary Cardiologist (physician) and Advanced Practice Providers (APPs- Physician Assistants and Nurse Practitioners) who all work together to provide you with the care you need, when you need it.   You may see any of the following providers on your designated Care Team at your next follow up: Dr Toribio Fuel Dr Ezra Shuck Dr. Morene Brownie Greig Mosses, NP Caffie Shed, [place] [facility] [place], [place] Beckey Coe, NP Jordan Lee, NP Ellouise Class, NP Tinnie Redman, PharmD Jaun Bash, PharmD   Please be sure to bring in all your medications bottles to every appointment.    Thank you for choosing [facility]

## 2024-10-05 ENCOUNTER — Encounter: Payer: Self-pay | Admitting: Gastroenterology

## 2024-10-07 ENCOUNTER — Other Ambulatory Visit (HOSPITAL_COMMUNITY): Payer: Self-pay

## 2024-10-13 ENCOUNTER — Other Ambulatory Visit (HOSPITAL_COMMUNITY): Payer: Self-pay

## 2025-02-02 ENCOUNTER — Ambulatory Visit: Admitting: Internal Medicine
